# Patient Record
Sex: Male | Born: 1960
Health system: Southern US, Community
[De-identification: ages and names within clinical notes are randomized; demographics above are authoritative.]

## PROBLEM LIST (undated history)

## (undated) DIAGNOSIS — F329 Major depressive disorder, single episode, unspecified: Secondary | ICD-10-CM

## (undated) DIAGNOSIS — G8929 Other chronic pain: Secondary | ICD-10-CM

## (undated) DIAGNOSIS — E785 Hyperlipidemia, unspecified: Secondary | ICD-10-CM

## (undated) DIAGNOSIS — F32A Depression, unspecified: Secondary | ICD-10-CM

## (undated) DIAGNOSIS — F419 Anxiety disorder, unspecified: Secondary | ICD-10-CM

## (undated) DIAGNOSIS — R7303 Prediabetes: Secondary | ICD-10-CM

## (undated) DIAGNOSIS — M549 Dorsalgia, unspecified: Secondary | ICD-10-CM

## (undated) HISTORY — DX: Hyperlipidemia, unspecified: E78.5

## (undated) HISTORY — DX: Anxiety disorder, unspecified: F41.9

## (undated) HISTORY — DX: Other chronic pain: G89.29

## (undated) HISTORY — DX: Prediabetes: R73.03

---

## 2002-10-01 HISTORY — PX: NASAL SINUS SURGERY: SHX719

## 2006-05-02 ENCOUNTER — Emergency Department: Payer: Self-pay | Admitting: General Practice

## 2008-05-30 ENCOUNTER — Emergency Department: Payer: Self-pay | Admitting: Emergency Medicine

## 2008-05-30 ENCOUNTER — Other Ambulatory Visit: Payer: Self-pay

## 2012-10-01 HISTORY — PX: CATARACT EXTRACTION: SUR2

## 2012-11-04 ENCOUNTER — Ambulatory Visit: Payer: Self-pay | Admitting: Ophthalmology

## 2012-12-30 ENCOUNTER — Ambulatory Visit: Payer: Self-pay | Admitting: Ophthalmology

## 2013-10-23 ENCOUNTER — Ambulatory Visit: Payer: Self-pay | Admitting: Family Medicine

## 2013-12-03 ENCOUNTER — Ambulatory Visit: Payer: Self-pay | Admitting: Family Medicine

## 2014-01-08 ENCOUNTER — Ambulatory Visit: Payer: Self-pay | Admitting: Family Medicine

## 2014-01-27 ENCOUNTER — Ambulatory Visit: Payer: Self-pay | Admitting: Family Medicine

## 2014-03-08 ENCOUNTER — Ambulatory Visit: Payer: Self-pay | Admitting: Family Medicine

## 2014-06-22 ENCOUNTER — Ambulatory Visit: Payer: Self-pay | Admitting: Neurosurgery

## 2014-12-02 LAB — LIPID PANEL
CHOLESTEROL: 224 mg/dL — AB (ref 0–200)
HDL: 35 mg/dL (ref 35–70)
Triglycerides: 440 mg/dL — AB (ref 40–160)

## 2015-01-03 LAB — TSH: TSH: 0.94 u[IU]/mL (ref <=5.90)

## 2015-01-03 LAB — HEMOGLOBIN A1C: Hgb A1c MFr Bld: 5.9 % (ref 4.0–6.0)

## 2015-01-21 NOTE — Op Note (Signed)
PATIENT NAME:  John Garrett, John Garrett MR#:  [id number] DATE OF BIRTH:  March 14, 1961  DATE OF PROCEDURE:  11/04/2012  LOCATION:  [place].  PREOPERATIVE DIAGNOSIS: Visually significant cataract of the right eye.   POSTOPERATIVE DIAGNOSIS: Visually significant cataract of the right eye.   OPERATIVE PROCEDURE: Cataract extraction by phacoemulsification with implant of intraocular lens to the right eye.   SURGEON: Birder Robson, MD  ANESTHESIA:  1. Managed anesthesia care.  2. 50-50 mixture of 0.75% bupivacaine and 4% Xylocaine given as a retrobulbar block.   COMPLICATIONS: None.   TECHNIQUE:  Four quadrant divide and conquer.   DESCRIPTION OF PROCEDURE: The patient was examined and consented for this procedure in the preoperative holding area and then brought back to the Operating Room where the anesthesia team employed managed anesthesia care.  3.5 milliliters of the aforementioned mixture were placed in the right orbit on an Atkinson needle without complication. The right eye was then prepped and draped in the usual sterile ophthalmic fashion. A lid speculum was placed. The side-port blade was used to create a paracentesis and the anterior chamber was filled with viscoelastic. The keratome was used to create a near clear corneal incision. The continuous curvilinear capsulorrhexis was performed with a cystotome followed by the capsulorrhexis forceps. Hydrodissection and hydrodelineation were carried out with BSS on a blunt cannula. The lens was removed in a four quadrant divide and conquer technique. The remaining cortical material was removed with the irrigation-aspiration handpiece. The capsular bag was inflated with viscoelastic and the Tecnis ZCB00 22.5-diopter lens, serial number [id number] was placed in the capsular bag without complication. The remaining viscoelastic was removed from the eye with the irrigation-aspiration handpiece. The wounds were hydrated. The anterior chamber was  flushed with Miostat and the eye was inflated to a physiologic pressure. 0.1 mL of cefuroxime concentration 10 mg/mL was placed in the anterior chamber.  The wounds were found to be water tight. The eye was dressed with Vigamox followed by Maxitrol ointment and a protective shield was placed. The patient will followup with me in 1 day.    ____________________________ Livingston Diones. Martika Egler, MD wlp:aw D: 11/04/2012 20:00:56 ET T: 11/05/2012 06:30:24 ET JOB#: [id number]  cc: Remiel Corti L. Bryona Foxworthy, MD, <Dictator> Livingston Diones Jet Traynham MD ELECTRONICALLY SIGNED 11/06/2012 15:35

## 2015-01-21 NOTE — Op Note (Signed)
PATIENT NAME:  John Garrett, John Garrett MR#:  [id number] DATE OF BIRTH:  1961-09-26  DATE OF PROCEDURE:  12/30/2012  PREOPERATIVE DIAGNOSIS: Visually significant cataract of the left eye.   POSTOPERATIVE DIAGNOSIS: Visually significant cataract of the left eye.   OPERATIVE PROCEDURE: Cataract extraction by phacoemulsification with implant of intraocular lens to left eye.   SURGEON: Birder Robson, MD   ANESTHESIA:  1. Managed anesthesia care.  2. Topical tetracaine drops followed by 2% Xylocaine jelly applied in the preoperative holding area.   COMPLICATIONS: None.   TECHNIQUE:  Four-quadrant divide and conquer.  DESCRIPTION OF PROCEDURE: The patient was examined and consented in the preoperative holding area where the aforementioned topical anesthesia was applied to the left eye and then brought back to the Operating Room where the left eye was prepped and draped in the usual sterile ophthalmic fashion and a lid speculum was placed. A paracentesis was created with the side port blade and the anterior chamber was filled with viscoelastic. A near clear corneal incision was performed with the steel keratome. A continuous curvilinear capsulorrhexis was performed with a cystotome followed by the capsulorrhexis forceps. Hydrodissection and hydrodelineation were carried out with BSS on a blunt cannula. The lens was removed in a four-quadrant divide and conquer technique and the remaining cortical material was removed with the irrigation-aspiration handpiece. The capsular bag was inflated with viscoelastic and the Technis ZCBOO   22.0-diopter lens, serial number [id number], was placed in the capsular bag without complication. The remaining viscoelastic was removed from the eye with the irrigation-aspiration handpiece. The wounds were hydrated. The anterior chamber was flushed with Miostat and the eye was inflated to physiologic pressure. 0.1 mL of cefuroxime concentration 10 mg/mL was placed in the anterior  chamber. The wounds were found to be water tight. The eye was dressed with Vigamox. The patient was given protective glasses to wear throughout the day and a shield with which to sleep tonight. The patient was also given drops with which to begin a drop regimen today and will follow-up with me in one day.    ____________________________ Livingston Diones. Anushri Casalino, MD wlp:cb D: 12/30/2012 22:02:38 ET T: 12/30/2012 23:55:33 ET JOB#: [id number]  cc: Ankur Snowdon L. Ady Heimann, MD, <Dictator> Livingston Diones Chirstina Haan MD ELECTRONICALLY SIGNED 12/31/2012 10:30

## 2015-02-05 ENCOUNTER — Emergency Department
Admission: EM | Admit: 2015-02-05 | Discharge: 2015-02-05 | Disposition: A | Discharge status: Home / Self Care | Payer: No Typology Code available for payment source | Attending: Emergency Medicine | Admitting: Emergency Medicine

## 2015-02-05 ENCOUNTER — Encounter: Payer: Self-pay | Admitting: Emergency Medicine

## 2015-02-05 ENCOUNTER — Emergency Department: Payer: No Typology Code available for payment source

## 2015-02-05 DIAGNOSIS — Y998 Other external cause status: Secondary | ICD-10-CM | POA: Diagnosis not present

## 2015-02-05 DIAGNOSIS — S39012A Strain of muscle, fascia and tendon of lower back, initial encounter: Secondary | ICD-10-CM

## 2015-02-05 DIAGNOSIS — Y9389 Activity, other specified: Secondary | ICD-10-CM | POA: Insufficient documentation

## 2015-02-05 DIAGNOSIS — Z87891 Personal history of nicotine dependence: Secondary | ICD-10-CM | POA: Diagnosis not present

## 2015-02-05 DIAGNOSIS — S3992XA Unspecified injury of lower back, initial encounter: Secondary | ICD-10-CM | POA: Diagnosis present

## 2015-02-05 DIAGNOSIS — Y92410 Unspecified street and highway as the place of occurrence of the external cause: Secondary | ICD-10-CM | POA: Diagnosis not present

## 2015-02-05 DIAGNOSIS — S199XXA Unspecified injury of neck, initial encounter: Secondary | ICD-10-CM | POA: Diagnosis not present

## 2015-02-05 DIAGNOSIS — M62830 Muscle spasm of back: Secondary | ICD-10-CM

## 2015-02-05 HISTORY — DX: Other chronic pain: G89.29

## 2015-02-05 HISTORY — DX: Major depressive disorder, single episode, unspecified: F32.9

## 2015-02-05 HISTORY — DX: Depression, unspecified: F32.A

## 2015-02-05 HISTORY — DX: Dorsalgia, unspecified: M54.9

## 2015-02-05 MED ORDER — KETOROLAC TROMETHAMINE 60 MG/2ML IM SOLN
60.0000 mg | Freq: Once | INTRAMUSCULAR | Status: AC
  Administered 2015-02-05: 60 mg via INTRAMUSCULAR

## 2015-02-05 MED ORDER — DIAZEPAM 5 MG/ML IJ SOLN
10.0000 mg | Freq: Once | INTRAMUSCULAR | Status: AC
  Administered 2015-02-05: 10 mg via INTRAMUSCULAR

## 2015-02-05 MED ORDER — DIAZEPAM 5 MG/ML IJ SOLN
INTRAMUSCULAR | Status: AC
  Administered 2015-02-05: 10 mg via INTRAMUSCULAR
  Filled 2015-02-05: qty 2

## 2015-02-05 MED ORDER — KETOROLAC TROMETHAMINE 60 MG/2ML IM SOLN
INTRAMUSCULAR | Status: AC
  Administered 2015-02-05: 60 mg via INTRAMUSCULAR
  Filled 2015-02-05: qty 2

## 2015-02-05 MED ORDER — ORPHENADRINE CITRATE ER 100 MG PO TB12: 100.0000 mg | ORAL_TABLET | Freq: Two times a day (BID) | ORAL | Status: DC | PRN
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
## 2015-03-08 ENCOUNTER — Ambulatory Visit (INDEPENDENT_AMBULATORY_CARE_PROVIDER_SITE_OTHER): Payer: Medicare PPO | Admitting: Family Medicine

## 2015-03-08 ENCOUNTER — Encounter: Payer: Self-pay | Admitting: Family Medicine

## 2015-03-08 VITALS — BP 120/80 | HR 88 | Resp 16 | Ht 63.0 in | Wt 248.6 lb

## 2015-03-08 DIAGNOSIS — F419 Anxiety disorder, unspecified: Secondary | ICD-10-CM | POA: Diagnosis not present

## 2015-03-08 DIAGNOSIS — F329 Major depressive disorder, single episode, unspecified: Secondary | ICD-10-CM | POA: Insufficient documentation

## 2015-03-08 DIAGNOSIS — F411 Generalized anxiety disorder: Secondary | ICD-10-CM | POA: Insufficient documentation

## 2015-03-08 DIAGNOSIS — M5416 Radiculopathy, lumbar region: Secondary | ICD-10-CM

## 2015-03-08 DIAGNOSIS — M542 Cervicalgia: Secondary | ICD-10-CM | POA: Diagnosis not present

## 2015-03-08 DIAGNOSIS — F1120 Opioid dependence, uncomplicated: Secondary | ICD-10-CM | POA: Insufficient documentation

## 2015-03-08 MED ORDER — OXYCODONE-ACETAMINOPHEN 5-325 MG PO TABS: 1.0000 | ORAL_TABLET | ORAL | Status: DC | PRN

## 2015-03-08 MED ORDER — ALPRAZOLAM 0.25 MG PO TABS
0.2500 mg | ORAL_TABLET | Freq: Every day | ORAL | Status: DC | PRN
Start: 2015-03-08 — End: 2015-12-02
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
## 2015-04-07 ENCOUNTER — Ambulatory Visit (INDEPENDENT_AMBULATORY_CARE_PROVIDER_SITE_OTHER): Payer: Medicare PPO | Admitting: Family Medicine

## 2015-04-07 VITALS — BP 118/78 | HR 78 | Ht 68.0 in | Wt 245.0 lb

## 2015-04-07 DIAGNOSIS — M5416 Radiculopathy, lumbar region: Secondary | ICD-10-CM

## 2015-04-07 MED ORDER — OXYCODONE-ACETAMINOPHEN 5-325 MG PO TABS: 1.0000 | ORAL_TABLET | ORAL | Status: DC | PRN
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
## 2015-05-03 ENCOUNTER — Ambulatory Visit (INDEPENDENT_AMBULATORY_CARE_PROVIDER_SITE_OTHER): Payer: Medicare PPO | Admitting: Family Medicine

## 2015-05-03 ENCOUNTER — Encounter: Payer: Self-pay | Admitting: Family Medicine

## 2015-05-03 ENCOUNTER — Encounter: Payer: Self-pay | Admitting: Internal Medicine

## 2015-05-03 VITALS — BP 144/75 | HR 84 | Temp 98.5°F | Resp 18 | Ht 68.0 in | Wt 238.6 lb

## 2015-05-03 DIAGNOSIS — F329 Major depressive disorder, single episode, unspecified: Secondary | ICD-10-CM

## 2015-05-03 DIAGNOSIS — E785 Hyperlipidemia, unspecified: Secondary | ICD-10-CM | POA: Diagnosis not present

## 2015-05-03 DIAGNOSIS — F322 Major depressive disorder, single episode, severe without psychotic features: Secondary | ICD-10-CM | POA: Diagnosis not present

## 2015-05-03 DIAGNOSIS — M5416 Radiculopathy, lumbar region: Secondary | ICD-10-CM

## 2015-05-03 DIAGNOSIS — E782 Mixed hyperlipidemia: Secondary | ICD-10-CM | POA: Insufficient documentation

## 2015-05-03 MED ORDER — BACLOFEN 10 MG PO TABS: 10.0000 mg | ORAL_TABLET | Freq: Four times a day (QID) | ORAL | Status: DC | PRN

## 2015-05-03 MED ORDER — BUPROPION HCL ER (XL) 300 MG PO TB24: 300.0000 mg | ORAL_TABLET | Freq: Every day | ORAL | Status: DC

## 2015-05-03 MED ORDER — IBUPROFEN 800 MG PO TABS: 800.0000 mg | ORAL_TABLET | Freq: Every day | ORAL | Status: DC | PRN

## 2015-05-03 MED ORDER — CITALOPRAM HYDROBROMIDE 40 MG PO TABS: 40.0000 mg | ORAL_TABLET | Freq: Every day | ORAL | Status: DC

## 2015-05-03 MED ORDER — OXYCODONE-ACETAMINOPHEN 5-325 MG PO TABS: 1.0000 | ORAL_TABLET | ORAL | Status: DC | PRN

## 2015-05-03 MED ORDER — ROSUVASTATIN CALCIUM 10 MG PO TABS: 10.0000 mg | ORAL_TABLET | Freq: Every day | ORAL | Status: DC

## 2015-05-03 NOTE — Progress Notes (Signed)
Name: John Garrett   MRN: [id number]    DOB: [birth year]   Date:05/03/2015       Progress Note  Subjective  Chief Complaint  Chief Complaint  Patient presents with  . Follow-up    4 wk  . Anxiety  . Hyperlipidemia  . Medication Refill    Hyperlipidemia This is a chronic problem. Recent lipid tests were reviewed and are high (elevated TC and elevated TG). Exacerbating diseases include obesity. Associated symptoms include leg pain. Pertinent negatives include no chest pain, myalgias or shortness of breath. Current antihyperlipidemic treatment includes statins. There are no compliance problems.  Risk factors for coronary artery disease include obesity, male sex and dyslipidemia.  Back Pain This is a chronic problem. The pain is present in the lumbar spine. The quality of the pain is described as burning and aching. The pain radiates to the right thigh. The pain is at a severity of 7/10. The pain is moderate. The symptoms are aggravated by sitting (sitting down for long periods of time ). Associated symptoms include leg pain and numbness. Pertinent negatives include no bladder incontinence, bowel incontinence, chest pain or paresis. He has tried NSAIDs, muscle relaxant and analgesics for the symptoms. The treatment provided significant relief.  Depression Pt. Is on SSRI and a non-SSRI anti-depressant for major depression, currently in remission. He reports no side effects from Bupropion and Celexa 40 mg daily.     Past Medical History  Diagnosis Date  . Depression   . Chronic back pain   . Anxiety   . Hyperlipidemia   . Chronic pain     Past Surgical History  Procedure Laterality Date  . Cataract extraction Right 2014    Family History  Problem Relation Age of Onset  . Adopted: Yes  . Family history unknown: Yes    History   Social History  . Marital Status: Single    Spouse Name: N/A  . Number of Children: N/A  . Years of Education: N/A   Occupational History  . Not  on file.   Social History Main Topics  . Smoking status: Current Every Day Smoker -- 1.00 packs/day    Types: Cigarettes, E-cigarettes  . Smokeless tobacco: Never Used  . Alcohol Use: 1.2 oz/week    2 Cans of beer per week  . Drug Use: No  . Sexual Activity: Not on file   Other Topics Concern  . Not on file   Social History Narrative     Current outpatient prescriptions:  .  ALPRAZolam (XANAX) 0.25 MG tablet, Take 1 tablet (0.25 mg total) by mouth daily as needed., Disp: 30 tablet, Rfl: 0 .  baclofen (LIORESAL) 10 MG tablet, Take 1 tablet by mouth 4 (four) times daily as needed., Disp: , Rfl:  .  buPROPion (WELLBUTRIN XL) 300 MG 24 hr tablet, Take 1 tablet by mouth daily., Disp: , Rfl:  .  busPIRone (BUSPAR) 15 MG tablet, Take 1 tablet by mouth 2 (two) times daily., Disp: , Rfl:  .  citalopram (CELEXA) 40 MG tablet, Take 1 tablet by mouth daily., Disp: , Rfl:  .  ibuprofen (ADVIL,MOTRIN) 800 MG tablet, Take 1 tablet by mouth daily as needed., Disp: , Rfl:  .  orphenadrine (NORFLEX) 100 MG tablet, take 1 tablet by mouth twice a day if needed for muscle spasm, Disp: , Rfl: 0 .  oxyCODONE-acetaminophen (PERCOCET/ROXICET) 5-325 MG per tablet, Take 1 tablet by mouth every 4 (four) hours as needed for severe pain. PRN, Disp:  180 tablet, Rfl: 0 .  rosuvastatin (CRESTOR) 10 MG tablet, Take 1 tablet by mouth daily., Disp: , Rfl:   No Known Allergies   Review of Systems  Respiratory: Negative for shortness of breath.   Cardiovascular: Negative for chest pain.  Gastrointestinal: Negative for bowel incontinence.  Genitourinary: Negative for bladder incontinence.  Musculoskeletal: Positive for back pain. Negative for myalgias.  Neurological: Positive for numbness.  Psychiatric/Behavioral: Positive for depression. The patient is nervous/anxious and has insomnia.       Objective  Filed Vitals:   05/03/15 1030  BP: 144/75  Pulse: 84  Temp: 98.5 F (36.9 C)  TempSrc: Oral  Resp:  18  Height: 5\' 8"  (1.727 m)  Weight: 238 lb 9.6 oz (108.228 kg)  SpO2: 96%    Physical Exam  Constitutional: He is oriented to person, place, and time and well-developed, well-nourished, and in no distress.  HENT:  Head: Normocephalic and atraumatic.  Cardiovascular: Normal rate.   Pulmonary/Chest: Breath sounds normal.  Abdominal: Soft. Bowel sounds are normal.  Musculoskeletal:       Lumbar back: He exhibits tenderness, pain and spasm. He exhibits no swelling.       Back:  Neurological: He is alert and oriented to person, place, and time.  Skin: Skin is warm and dry.  Psychiatric: Affect and judgment normal.  Nursing note and vitals reviewed.    Assessment & Plan 1. Hyperlipidemia Elevated triglycerides and total cholesterol on his last FLP in March 2016. Patient continues on Crestor 10 mg at bedtime. Recheck FLP today - rosuvastatin (CRESTOR) 10 MG tablet; Take 1 tablet (10 mg total) by mouth daily.  Dispense: 90 tablet; Refill: 0 - Lipid Profile - Comprehensive metabolic panel  2. Major depression, chronic Symptoms stable on present therapy. Refills provided. - buPROPion (WELLBUTRIN XL) 300 MG 24 hr tablet; Take 1 tablet (300 mg total) by mouth daily.  Dispense: 90 tablet; Refill: 0 - citalopram (CELEXA) 40 MG tablet; Take 1 tablet (40 mg total) by mouth daily.  Dispense: 90 tablet; Refill: 0  3. Lumbar radiculopathy, chronic Chronic low back pain, responsive to opioid, NSAID, and muscle relaxant therapy. Patient is taking the medications as directed. He is aware of the potential adverse effects of opioids. Refills provided and follow-up in one month. - oxyCODONE-acetaminophen (PERCOCET/ROXICET) 5-325 MG per tablet; Take 1 tablet by mouth every 4 (four) hours as needed for severe pain. PRN  Dispense: 180 tablet; Refill: 0 - baclofen (LIORESAL) 10 MG tablet; Take 1 tablet (10 mg total) by mouth 4 (four) times daily as needed.  Dispense: 120 tablet; Refill: 5 - ibuprofen  (ADVIL,MOTRIN) 800 MG tablet; Take 1 tablet (800 mg total) by mouth daily as needed.  Dispense: 90 tablet; Refill: 1    Mckinze Poirier Asad A. [facility] 05/03/2015 11:29 AM

## 2015-05-04 ENCOUNTER — Ambulatory Visit: Payer: Medicare PPO | Admitting: Family Medicine

## 2015-05-05 ENCOUNTER — Telehealth: Payer: Self-pay | Admitting: Internal Medicine

## 2015-05-05 ENCOUNTER — Ambulatory Visit: Payer: Medicare PPO | Admitting: Family Medicine

## 2015-05-05 NOTE — Telephone Encounter (Signed)
Called patient to inform him that John Garrett and [place] papers are ready for pickup.

## 2015-05-05 NOTE — Telephone Encounter (Signed)
Called patient to inform him that Bertell Maria and [place] papers are ready for pickup.

## 2015-05-31 ENCOUNTER — Ambulatory Visit: Payer: Medicare PPO | Admitting: Family Medicine

## 2015-06-02 ENCOUNTER — Encounter: Payer: Self-pay | Admitting: Family Medicine

## 2015-06-02 ENCOUNTER — Ambulatory Visit (INDEPENDENT_AMBULATORY_CARE_PROVIDER_SITE_OTHER): Payer: Medicare PPO | Admitting: Family Medicine

## 2015-06-02 VITALS — BP 140/75 | HR 74 | Temp 97.9°F | Resp 17 | Ht 68.0 in | Wt 248.1 lb

## 2015-06-02 DIAGNOSIS — M5416 Radiculopathy, lumbar region: Secondary | ICD-10-CM

## 2015-06-02 MED ORDER — OXYCODONE-ACETAMINOPHEN 5-325 MG PO TABS: 1.0000 | ORAL_TABLET | ORAL | Status: DC | PRN

## 2015-06-02 NOTE — Progress Notes (Signed)
Name: John Garrett   MRN: [id number]    DOB: [birth year]   Date:06/02/2015       Progress Note  Subjective  Chief Complaint  Chief Complaint  Patient presents with  . Medication Refill  . Hyperlipidemia  . Anxiety    Back Pain This is a chronic problem. The pain is present in the lumbar spine. The pain is at a severity of 6/10. The pain is moderate. The symptoms are aggravated by bending, sitting and standing. Associated symptoms include leg pain. Pertinent negatives include no bladder incontinence, bowel incontinence or numbness. John Garrett has tried muscle relaxant, analgesics, NSAIDs and chiropractic manipulation for the symptoms.     Past Medical History  Diagnosis Date  . Depression   . Chronic back pain   . Anxiety   . Hyperlipidemia   . Chronic pain     Past Surgical History  Procedure Laterality Date  . Cataract extraction Right 2014    Family History  Problem Relation Age of Onset  . Adopted: Yes  . Family history unknown: Yes    Social History   Social History  . Marital Status: Single    Spouse Name: N/A  . Number of Children: N/A  . Years of Education: N/A   Occupational History  . Not on file.   Social History Main Topics  . Smoking status: Current Every Day Smoker -- 1.00 packs/day    Types: Cigarettes, E-cigarettes  . Smokeless tobacco: Never Used  . Alcohol Use: 1.2 oz/week    2 Cans of beer per week  . Drug Use: No  . Sexual Activity: Not on file   Other Topics Concern  . Not on file   Social History Narrative     Current outpatient prescriptions:  .  ALPRAZolam (XANAX) 0.25 MG tablet, Take 1 tablet (0.25 mg total) by mouth daily as needed., Disp: 30 tablet, Rfl: 0 .  baclofen (LIORESAL) 10 MG tablet, Take 1 tablet (10 mg total) by mouth 4 (four) times daily as needed., Disp: 120 tablet, Rfl: 5 .  buPROPion (WELLBUTRIN XL) 300 MG 24 hr tablet, Take 1 tablet (300 mg total) by mouth daily., Disp: 90 tablet, Rfl: 0 .  busPIRone (BUSPAR) 15 MG  tablet, Take 1 tablet by mouth 2 (two) times daily., Disp: , Rfl:  .  citalopram (CELEXA) 40 MG tablet, Take 1 tablet (40 mg total) by mouth daily., Disp: 90 tablet, Rfl: 0 .  ibuprofen (ADVIL,MOTRIN) 800 MG tablet, Take 1 tablet (800 mg total) by mouth daily as needed., Disp: 90 tablet, Rfl: 1 .  orphenadrine (NORFLEX) 100 MG tablet, take 1 tablet by mouth twice a day if needed for muscle spasm, Disp: , Rfl: 0 .  oxyCODONE-acetaminophen (PERCOCET/ROXICET) 5-325 MG per tablet, Take 1 tablet by mouth every 4 (four) hours as needed for severe pain. PRN, Disp: 180 tablet, Rfl: 0 .  rosuvastatin (CRESTOR) 10 MG tablet, Take 1 tablet (10 mg total) by mouth daily., Disp: 90 tablet, Rfl: 0  No Known Allergies   Review of Systems  Gastrointestinal: Negative for bowel incontinence.  Genitourinary: Negative for bladder incontinence.  Musculoskeletal: Positive for back pain.  Neurological: Negative for numbness.    Objective  Filed Vitals:   06/02/15 1039  BP: 140/75  Pulse: 74  Temp: 97.9 F (36.6 C)  TempSrc: Oral  Resp: 17  Height: 5\' 8"  (1.727 m)  Weight: 248 lb 1.6 oz (112.537 kg)  SpO2: 96%    Physical Exam  Constitutional: John Garrett is  well-developed, well-nourished, and in no distress.  Cardiovascular: Normal rate and regular rhythm.   Pulmonary/Chest: Effort normal and breath sounds normal.  Musculoskeletal:       Lumbar back: John Garrett exhibits tenderness, pain and spasm.       Back:  Nursing note and vitals reviewed.  Assessment & Plan  1. Lumbar radiculopathy, chronic Chronic low back pain, stable and controlled on opioid therapy. Refills provided. Follow-up in one month.  - oxyCODONE-acetaminophen (PERCOCET/ROXICET) 5-325 MG per tablet; Take 1 tablet by mouth every 4 (four) hours as needed for severe pain. PRN  Dispense: 180 tablet; Refill: 0   Merwin Breden Asad A. [facility] 06/02/2015 11:03 AM

## 2015-07-04 ENCOUNTER — Ambulatory Visit (INDEPENDENT_AMBULATORY_CARE_PROVIDER_SITE_OTHER): Payer: Medicare PPO | Admitting: Family Medicine

## 2015-07-04 ENCOUNTER — Encounter: Payer: Self-pay | Admitting: Family Medicine

## 2015-07-04 VITALS — BP 140/75 | HR 94 | Temp 97.8°F | Resp 18 | Ht 68.0 in | Wt 240.6 lb

## 2015-07-04 DIAGNOSIS — F17200 Nicotine dependence, unspecified, uncomplicated: Secondary | ICD-10-CM | POA: Insufficient documentation

## 2015-07-04 DIAGNOSIS — M5416 Radiculopathy, lumbar region: Secondary | ICD-10-CM | POA: Diagnosis not present

## 2015-07-04 MED ORDER — OXYCODONE-ACETAMINOPHEN 5-325 MG PO TABS: 1.0000 | ORAL_TABLET | ORAL | Status: DC | PRN

## 2015-07-04 NOTE — Progress Notes (Signed)
Name: John Garrett   MRN: [id number]    DOB: [birth year]   Date:07/04/2015       Progress Note  Subjective  Chief Complaint  Chief Complaint  Patient presents with  . Follow-up    1 mo  . Hyperlipidemia  . Anxiety  . Medication Refill    Oxycodone    Back Pain This is a chronic problem. The problem is unchanged. The pain is present in the lumbar spine. The quality of the pain is described as shooting and cramping. The pain is at a severity of 5/10. The pain is moderate. The symptoms are aggravated by bending, sitting and standing. Associated symptoms include leg pain. Pertinent negatives include no bladder incontinence, bowel incontinence or numbness. He has tried muscle relaxant, analgesics and NSAIDs for the symptoms.    Past Medical History  Diagnosis Date  . Depression   . Chronic back pain   . Anxiety   . Hyperlipidemia   . Chronic pain     Past Surgical History  Procedure Laterality Date  . Cataract extraction Right 2014    Family History  Problem Relation Age of Onset  . Adopted: Yes  . Family history unknown: Yes    Social History   Social History  . Marital Status: Single    Spouse Name: N/A  . Number of Children: N/A  . Years of Education: N/A   Occupational History  . Not on file.   Social History Main Topics  . Smoking status: Current Every Day Smoker -- 1.00 packs/day    Types: Cigarettes, E-cigarettes  . Smokeless tobacco: Never Used  . Alcohol Use: 1.2 oz/week    2 Cans of beer per week  . Drug Use: No  . Sexual Activity: Not on file   Other Topics Concern  . Not on file   Social History Narrative    Current outpatient prescriptions:  .  ALPRAZolam (XANAX) 0.25 MG tablet, Take 1 tablet (0.25 mg total) by mouth daily as needed., Disp: 30 tablet, Rfl: 0 .  baclofen (LIORESAL) 10 MG tablet, Take 1 tablet (10 mg total) by mouth 4 (four) times daily as needed., Disp: 120 tablet, Rfl: 5 .  buPROPion (WELLBUTRIN XL) 300 MG 24 hr tablet, Take  1 tablet (300 mg total) by mouth daily., Disp: 90 tablet, Rfl: 0 .  busPIRone (BUSPAR) 15 MG tablet, Take 1 tablet by mouth 2 (two) times daily., Disp: , Rfl:  .  citalopram (CELEXA) 40 MG tablet, Take 1 tablet (40 mg total) by mouth daily., Disp: 90 tablet, Rfl: 0 .  ibuprofen (ADVIL,MOTRIN) 800 MG tablet, Take 1 tablet (800 mg total) by mouth daily as needed., Disp: 90 tablet, Rfl: 1 .  orphenadrine (NORFLEX) 100 MG tablet, take 1 tablet by mouth twice a day if needed for muscle spasm, Disp: , Rfl: 0 .  oxyCODONE-acetaminophen (PERCOCET/ROXICET) 5-325 MG per tablet, Take 1 tablet by mouth every 4 (four) hours as needed for severe pain. PRN, Disp: 180 tablet, Rfl: 0 .  rosuvastatin (CRESTOR) 10 MG tablet, Take 1 tablet (10 mg total) by mouth daily., Disp: 90 tablet, Rfl: 0  No Known Allergies   Review of Systems  Gastrointestinal: Negative for bowel incontinence.  Genitourinary: Negative for bladder incontinence.  Musculoskeletal: Positive for back pain.  Neurological: Negative for numbness.    Objective  Filed Vitals:   07/04/15 1113  BP: 140/75  Pulse: 94  Temp: 97.8 F (36.6 C)  TempSrc: Oral  Resp: 18  Height: 5'  8" (1.727 m)  Weight: 240 lb 9.6 oz (109.135 kg)  SpO2: 97%    Physical Exam  Constitutional: He is well-developed, well-nourished, and in no distress.  Cardiovascular: Normal rate and regular rhythm.   Pulmonary/Chest: Effort normal and breath sounds normal.  Musculoskeletal:       Lumbar back: He exhibits tenderness, pain and spasm.       Back:  Nursing note and vitals reviewed.    Assessment & Plan  1. Lumbar radiculopathy, chronic Chronic low back pain, controlled on opioid therapy. Patient compliant with controlled substances agreement. Refills provided and follow-up in one month. - oxyCODONE-acetaminophen (PERCOCET/ROXICET) 5-325 MG tablet; Take 1 tablet by mouth every 4 (four) hours as needed for severe pain. PRN  Dispense: 180 tablet; Refill:  0   Koralee Wedeking Asad A. [facility] 07/04/2015 11:39 AM

## 2015-08-04 ENCOUNTER — Ambulatory Visit (INDEPENDENT_AMBULATORY_CARE_PROVIDER_SITE_OTHER): Payer: Medicare PPO | Admitting: Family Medicine

## 2015-08-04 ENCOUNTER — Encounter: Payer: Self-pay | Admitting: Family Medicine

## 2015-08-04 VITALS — BP 162/76 | HR 98 | Temp 98.8°F | Resp 18 | Ht 68.0 in | Wt 241.6 lb

## 2015-08-04 DIAGNOSIS — E785 Hyperlipidemia, unspecified: Secondary | ICD-10-CM | POA: Insufficient documentation

## 2015-08-04 DIAGNOSIS — M5416 Radiculopathy, lumbar region: Secondary | ICD-10-CM

## 2015-08-04 MED ORDER — OXYCODONE-ACETAMINOPHEN 5-325 MG PO TABS: 1.0000 | ORAL_TABLET | ORAL | Status: DC | PRN

## 2015-08-04 NOTE — Progress Notes (Signed)
Name: John Garrett   MRN: [id number]    DOB: 01/25/1961   Date:08/04/2015       Progress Note  Subjective  Chief Complaint  Chief Complaint  Patient presents with  . Back Pain    1 month follow up    Back Pain This is a chronic problem. The problem is unchanged. The pain is present in the gluteal and lumbar spine. The quality of the pain is described as shooting. The pain is at a severity of 7/10. The pain is severe. The symptoms are aggravated by position. Associated symptoms include leg pain. Pertinent negatives include no bladder incontinence, bowel incontinence or numbness. He has tried analgesics for the symptoms.    Past Medical History  Diagnosis Date  . Depression   . Chronic back pain   . Anxiety   . Hyperlipidemia   . Chronic pain     Past Surgical History  Procedure Laterality Date  . Cataract extraction Right 2014    Family History  Problem Relation Age of Onset  . Adopted: Yes  . Family history unknown: Yes    Social History   Social History  . Marital Status: Single    Spouse Name: N/A  . Number of Children: N/A  . Years of Education: N/A   Occupational History  . Not on file.   Social History Main Topics  . Smoking status: Current Every Day Smoker -- 1.00 packs/day    Types: Cigarettes, E-cigarettes  . Smokeless tobacco: Never Used  . Alcohol Use: 1.2 oz/week    2 Cans of beer per week  . Drug Use: No  . Sexual Activity: Not on file   Other Topics Concern  . Not on file   Social History Narrative     Current outpatient prescriptions:  .  ALPRAZolam (XANAX) 0.25 MG tablet, Take 1 tablet (0.25 mg total) by mouth daily as needed., Disp: 30 tablet, Rfl: 0 .  baclofen (LIORESAL) 10 MG tablet, Take 1 tablet (10 mg total) by mouth 4 (four) times daily as needed., Disp: 120 tablet, Rfl: 5 .  buPROPion (WELLBUTRIN XL) 300 MG 24 hr tablet, Take 1 tablet (300 mg total) by mouth daily., Disp: 90 tablet, Rfl: 0 .  busPIRone (BUSPAR) 15 MG tablet,  Take 1 tablet by mouth 2 (two) times daily., Disp: , Rfl:  .  citalopram (CELEXA) 40 MG tablet, Take 1 tablet (40 mg total) by mouth daily., Disp: 90 tablet, Rfl: 0 .  ibuprofen (ADVIL,MOTRIN) 800 MG tablet, Take 1 tablet (800 mg total) by mouth daily as needed., Disp: 90 tablet, Rfl: 1 .  orphenadrine (NORFLEX) 100 MG tablet, take 1 tablet by mouth twice a day if needed for muscle spasm, Disp: , Rfl: 0 .  oxyCODONE-acetaminophen (PERCOCET/ROXICET) 5-325 MG tablet, Take 1 tablet by mouth every 4 (four) hours as needed for severe pain. PRN, Disp: 180 tablet, Rfl: 0 .  rosuvastatin (CRESTOR) 10 MG tablet, Take 1 tablet (10 mg total) by mouth daily., Disp: 90 tablet, Rfl: 0  No Known Allergies   Review of Systems  Gastrointestinal: Negative for bowel incontinence.  Genitourinary: Negative for bladder incontinence.  Musculoskeletal: Positive for back pain and joint pain.  Neurological: Negative for numbness.    Objective  Filed Vitals:   08/04/15 1027  BP: 162/76  Pulse: 98  Temp: 98.8 F (37.1 C)  TempSrc: Oral  Resp: 18  Height: 5\' 8"  (1.727 m)  Weight: 241 lb 9.6 oz (109.589 kg)  SpO2: 97%  Physical Exam  Constitutional: He is oriented to person, place, and time and well-developed, well-nourished, and in no distress.  Cardiovascular: Normal rate and regular rhythm.   No murmur heard. Pulmonary/Chest: Effort normal and breath sounds normal. He has no wheezes. He has no rales.  Musculoskeletal:       Lumbar back: He exhibits decreased range of motion, tenderness, pain and spasm.       Back:  Neurological: He is alert and oriented to person, place, and time.  Nursing note and vitals reviewed.  Assessment & Plan  1. Lumbar radiculopathy, chronic Chronic low back pain stable on opioid therapy. Patient compliant with controlled substances agreement and taking the medication as directed. No reported adverse effects. Refills provided and follow-up in one month. -  oxyCODONE-acetaminophen (PERCOCET/ROXICET) 5-325 MG tablet; Take 1 tablet by mouth every 4 (four) hours as needed for severe pain. PRN  Dispense: 180 tablet; Refill: 0   John Garrett Asad A. [facility] 08/04/2015 10:39 AM

## 2015-09-01 ENCOUNTER — Encounter: Payer: Self-pay | Admitting: Emergency Medicine

## 2015-09-01 ENCOUNTER — Emergency Department: Payer: Medicare PPO

## 2015-09-01 ENCOUNTER — Emergency Department
Admission: EM | Admit: 2015-09-01 | Discharge: 2015-09-01 | Disposition: A | Discharge status: Home / Self Care | Payer: Medicare PPO | Attending: Emergency Medicine | Admitting: Emergency Medicine

## 2015-09-01 DIAGNOSIS — Z79899 Other long term (current) drug therapy: Secondary | ICD-10-CM | POA: Insufficient documentation

## 2015-09-01 DIAGNOSIS — S61214A Laceration without foreign body of right ring finger without damage to nail, initial encounter: Secondary | ICD-10-CM | POA: Diagnosis not present

## 2015-09-01 DIAGNOSIS — Z23 Encounter for immunization: Secondary | ICD-10-CM | POA: Diagnosis not present

## 2015-09-01 DIAGNOSIS — Y9389 Activity, other specified: Secondary | ICD-10-CM | POA: Insufficient documentation

## 2015-09-01 DIAGNOSIS — Y990 Civilian activity done for income or pay: Secondary | ICD-10-CM | POA: Insufficient documentation

## 2015-09-01 DIAGNOSIS — Y9289 Other specified places as the place of occurrence of the external cause: Secondary | ICD-10-CM | POA: Insufficient documentation

## 2015-09-01 DIAGNOSIS — F17210 Nicotine dependence, cigarettes, uncomplicated: Secondary | ICD-10-CM | POA: Diagnosis not present

## 2015-09-01 DIAGNOSIS — IMO0002 Reserved for concepts with insufficient information to code with codable children: Secondary | ICD-10-CM

## 2015-09-01 MED ORDER — TETANUS-DIPHTH-ACELL PERTUSSIS 5-2.5-18.5 LF-MCG/0.5 IM SUSP
0.5000 mL | Freq: Once | INTRAMUSCULAR | Status: AC
  Administered 2015-09-01: 0.5 mL via INTRAMUSCULAR
  Filled 2015-09-01: qty 0.5

## 2015-09-01 MED ORDER — LIDOCAINE HCL (PF) 1 % IJ SOLN
INTRAMUSCULAR | Status: AC
  Administered 2015-09-01: 5 mL via INTRADERMAL
  Filled 2015-09-01: qty 10

## 2015-09-01 MED ORDER — OXYCODONE-ACETAMINOPHEN 5-325 MG PO TABS
1.0000 | ORAL_TABLET | Freq: Once | ORAL | Status: AC
  Administered 2015-09-01: 1 via ORAL
  Filled 2015-09-01: qty 1

## 2015-09-01 MED ORDER — LIDOCAINE HCL (PF) 1 % IJ SOLN
5.0000 mL | Freq: Once | INTRAMUSCULAR | Status: AC
  Administered 2015-09-01: 5 mL via INTRADERMAL

## 2015-09-01 MED ORDER — CEPHALEXIN 500 MG PO CAPS: 500.0000 mg | ORAL_CAPSULE | Freq: Three times a day (TID) | ORAL | Status: DC

## 2015-09-01 NOTE — ED Notes (Signed)
Cleaned and Wrapped up 4th digit on right hand.

## 2015-09-01 NOTE — ED Notes (Signed)
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

## 2015-09-01 NOTE — Discharge Instructions (Signed)
Please follow-up your primary care physician in 10 days for suture removal. Please take your antibiotics as prescribed. Return to the emergency department for any increased pain, swelling, redness or fever.
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
## 2015-09-01 NOTE — ED Notes (Signed)
Patient transported to X-ray 

## 2015-09-01 NOTE — ED Notes (Signed)
Pt. Going home with friend 

## 2015-09-02 ENCOUNTER — Ambulatory Visit (INDEPENDENT_AMBULATORY_CARE_PROVIDER_SITE_OTHER): Payer: Medicare PPO | Admitting: Family Medicine

## 2015-09-02 ENCOUNTER — Encounter: Payer: Self-pay | Admitting: Family Medicine

## 2015-09-02 VITALS — BP 130/88 | HR 78 | Temp 98.4°F | Resp 17 | Ht 68.0 in | Wt 243.7 lb

## 2015-09-02 DIAGNOSIS — Z23 Encounter for immunization: Secondary | ICD-10-CM

## 2015-09-02 DIAGNOSIS — R030 Elevated blood-pressure reading, without diagnosis of hypertension: Secondary | ICD-10-CM

## 2015-09-02 DIAGNOSIS — Z79899 Other long term (current) drug therapy: Secondary | ICD-10-CM | POA: Insufficient documentation

## 2015-09-02 DIAGNOSIS — IMO0001 Reserved for inherently not codable concepts without codable children: Secondary | ICD-10-CM

## 2015-09-02 DIAGNOSIS — M5416 Radiculopathy, lumbar region: Secondary | ICD-10-CM

## 2015-09-02 MED ORDER — OXYCODONE-ACETAMINOPHEN 5-325 MG PO TABS: 1.0000 | ORAL_TABLET | ORAL | Status: DC | PRN

## 2015-09-02 NOTE — Progress Notes (Signed)
Name: John Garrett   MRN: [id number]    DOB: Apr 29, 1961   Date:09/02/2015       Progress Note  Subjective  Chief Complaint  Chief Complaint  Patient presents with  . Follow-up    1 mo  . Hyperlipidemia  . Medication Refill    oxycodone 5-325 mg     Back Pain This is a chronic problem. The problem is unchanged. The pain is present in the gluteal and lumbar spine. The quality of the pain is described as shooting. The pain is at a severity of 5/10. The pain is moderate. The symptoms are aggravated by position. Associated symptoms include leg pain and numbness (left leg goes numb periodically). Pertinent negatives include no bladder incontinence, bowel incontinence, chest pain, fever or headaches. He has tried analgesics for the symptoms.    Past Medical History  Diagnosis Date  . Depression   . Chronic back pain   . Anxiety   . Hyperlipidemia   . Chronic pain     Past Surgical History  Procedure Laterality Date  . Cataract extraction Right 2014    Family History  Problem Relation Age of Onset  . Adopted: Yes  . Family history unknown: Yes    Social History   Social History  . Marital Status: Single    Spouse Name: N/A  . Number of Children: N/A  . Years of Education: N/A   Occupational History  . Not on file.   Social History Main Topics  . Smoking status: Current Every Day Smoker -- 1.00 packs/day    Types: Cigarettes, E-cigarettes  . Smokeless tobacco: Never Used  . Alcohol Use: 1.2 oz/week    2 Cans of beer per week  . Drug Use: No  . Sexual Activity: Not on file   Other Topics Concern  . Not on file   Social History Narrative     Current outpatient prescriptions:  .  ALPRAZolam (XANAX) 0.25 MG tablet, Take 1 tablet (0.25 mg total) by mouth daily as needed., Disp: 30 tablet, Rfl: 0 .  baclofen (LIORESAL) 10 MG tablet, Take 1 tablet (10 mg total) by mouth 4 (four) times daily as needed., Disp: 120 tablet, Rfl: 5 .  buPROPion (WELLBUTRIN XL) 300 MG  24 hr tablet, Take 1 tablet (300 mg total) by mouth daily., Disp: 90 tablet, Rfl: 0 .  busPIRone (BUSPAR) 15 MG tablet, Take 1 tablet by mouth 2 (two) times daily., Disp: , Rfl:  .  cephALEXin (KEFLEX) 500 MG capsule, Take 1 capsule (500 mg total) by mouth 3 (three) times daily., Disp: 30 capsule, Rfl: 0 .  citalopram (CELEXA) 40 MG tablet, Take 1 tablet (40 mg total) by mouth daily., Disp: 90 tablet, Rfl: 0 .  ibuprofen (ADVIL,MOTRIN) 800 MG tablet, Take 1 tablet (800 mg total) by mouth daily as needed., Disp: 90 tablet, Rfl: 1 .  orphenadrine (NORFLEX) 100 MG tablet, take 1 tablet by mouth twice a day if needed for muscle spasm, Disp: , Rfl: 0 .  oxyCODONE-acetaminophen (PERCOCET/ROXICET) 5-325 MG tablet, Take 1 tablet by mouth every 4 (four) hours as needed for severe pain. PRN, Disp: 180 tablet, Rfl: 0 .  rosuvastatin (CRESTOR) 10 MG tablet, Take 1 tablet (10 mg total) by mouth daily., Disp: 90 tablet, Rfl: 0  No Known Allergies   Review of Systems  Constitutional: Negative for fever and chills.  Eyes: Negative for blurred vision and double vision.  Respiratory: Negative for cough and sputum production.   Cardiovascular: Negative  for chest pain and palpitations.  Gastrointestinal: Negative for bowel incontinence.  Genitourinary: Negative for bladder incontinence.  Musculoskeletal: Positive for back pain.  Neurological: Positive for numbness (left leg goes numb periodically). Negative for headaches.    Objective  Filed Vitals:   09/02/15 1009  BP: 152/72  Pulse: 78  Temp: 98.4 F (36.9 C)  TempSrc: Oral  Resp: 17  Height: 5\' 8"  (1.727 m)  Weight: 243 lb 11.2 oz (110.542 kg)  SpO2: 97%    Physical Exam  Constitutional: He is well-developed, well-nourished, and in no distress.  Cardiovascular: Normal rate and regular rhythm.   Pulmonary/Chest: Effort normal and breath sounds normal.  Musculoskeletal:       Lumbar back: He exhibits tenderness, pain and spasm.        Back:  Skin: Skin is warm and dry.  Nursing note and vitals reviewed.   Assessment & Plan  1. Need for immunization against influenza  - Flu Vaccine QUAD 36+ mos PF IM (Fluarix & Fluzone Quad PF)  2. Lumbar radiculopathy, chronic Pain controlled on Percocet 5 mg taken up to 6 times daily as needed. Patient compliant with controlled substances agreement. Refills provided - oxyCODONE-acetaminophen (PERCOCET/ROXICET) 5-325 MG tablet; Take 1 tablet by mouth every 4 (four) hours as needed for severe pain. PRN  Dispense: 180 tablet; Refill: 0  3. Elevated BP Systolic blood pressure is improved on repeat.patient is in more pain than usual because of an injury sustained to his hand. Advised to check blood pressure at home regularly and bring logs to review in one month. Verbalized understanding   Alfie Rideaux [facility] 09/02/2015 10:24 AM

## 2015-10-04 ENCOUNTER — Ambulatory Visit (INDEPENDENT_AMBULATORY_CARE_PROVIDER_SITE_OTHER): Payer: Medicare PPO | Admitting: Family Medicine

## 2015-10-04 ENCOUNTER — Encounter: Payer: Self-pay | Admitting: Family Medicine

## 2015-10-04 VITALS — BP 131/78 | HR 83 | Temp 98.6°F | Resp 17 | Ht 68.0 in | Wt 250.0 lb

## 2015-10-04 DIAGNOSIS — M5416 Radiculopathy, lumbar region: Secondary | ICD-10-CM | POA: Diagnosis not present

## 2015-10-04 MED ORDER — OXYCODONE-ACETAMINOPHEN 5-325 MG PO TABS: 1.0000 | ORAL_TABLET | ORAL | Status: DC | PRN

## 2015-10-04 NOTE — Progress Notes (Signed)
Name: John Garrett   MRN: [id number]    DOB: [birth year]   Date:10/04/2015       Progress Note  Subjective  Chief Complaint  Chief Complaint  Patient presents with  . Follow-up    1 mo  . Medication Refill    oxycodone 5-325mg    . Hyperlipidemia    Back Pain This is a chronic problem. The problem is unchanged. The pain is present in the gluteal and lumbar spine. The quality of the pain is described as shooting. The pain is at a severity of 7/10. The pain is moderate. The symptoms are aggravated by position. Associated symptoms include numbness (left leg goes numb periodically). Pertinent negatives include no bladder incontinence, bowel incontinence, chest pain, fever or headaches. He has tried analgesics (Percocet 5-325 mg q 4hrs PRN) for the symptoms. The treatment provided significant relief.    Past Medical History  Diagnosis Date  . Depression   . Chronic back pain   . Anxiety   . Hyperlipidemia   . Chronic pain     Past Surgical History  Procedure Laterality Date  . Cataract extraction Right 2014    Family History  Problem Relation Age of Onset  . Adopted: Yes  . Family history unknown: Yes    Social History   Social History  . Marital Status: Single    Spouse Name: N/A  . Number of Children: N/A  . Years of Education: N/A   Occupational History  . Not on file.   Social History Main Topics  . Smoking status: Current Every Day Smoker -- 1.00 packs/day    Types: Cigarettes, E-cigarettes  . Smokeless tobacco: Never Used  . Alcohol Use: 1.2 oz/week    2 Cans of beer per week  . Drug Use: No  . Sexual Activity: Not on file   Other Topics Concern  . Not on file   Social History Narrative     Current outpatient prescriptions:  .  ALPRAZolam (XANAX) 0.25 MG tablet, Take 1 tablet (0.25 mg total) by mouth daily as needed., Disp: 30 tablet, Rfl: 0 .  baclofen (LIORESAL) 10 MG tablet, Take 1 tablet (10 mg total) by mouth 4 (four) times daily as needed.,  Disp: 120 tablet, Rfl: 5 .  buPROPion (WELLBUTRIN XL) 300 MG 24 hr tablet, Take 1 tablet (300 mg total) by mouth daily., Disp: 90 tablet, Rfl: 0 .  busPIRone (BUSPAR) 15 MG tablet, Take 1 tablet by mouth 2 (two) times daily., Disp: , Rfl:  .  cephALEXin (KEFLEX) 500 MG capsule, Take 1 capsule (500 mg total) by mouth 3 (three) times daily., Disp: 30 capsule, Rfl: 0 .  citalopram (CELEXA) 40 MG tablet, Take 1 tablet (40 mg total) by mouth daily., Disp: 90 tablet, Rfl: 0 .  ibuprofen (ADVIL,MOTRIN) 800 MG tablet, Take 1 tablet (800 mg total) by mouth daily as needed., Disp: 90 tablet, Rfl: 1 .  orphenadrine (NORFLEX) 100 MG tablet, take 1 tablet by mouth twice a day if needed for muscle spasm, Disp: , Rfl: 0 .  oxyCODONE-acetaminophen (PERCOCET/ROXICET) 5-325 MG tablet, Take 1 tablet by mouth every 4 (four) hours as needed for severe pain. PRN, Disp: 180 tablet, Rfl: 0 .  rosuvastatin (CRESTOR) 10 MG tablet, Take 1 tablet (10 mg total) by mouth daily., Disp: 90 tablet, Rfl: 0  No Known Allergies   Review of Systems  Constitutional: Negative for fever and chills.  Eyes: Negative for blurred vision and double vision.  Respiratory: Negative for cough  and sputum production.   Cardiovascular: Negative for chest pain and palpitations.  Gastrointestinal: Negative for bowel incontinence.  Genitourinary: Negative for bladder incontinence.  Musculoskeletal: Positive for back pain.  Neurological: Positive for numbness (left leg goes numb periodically). Negative for headaches.    Objective  Filed Vitals:   10/04/15 1035  BP: 131/78  Pulse: 83  Temp: 98.6 F (37 C)  TempSrc: Oral  Resp: 17  Height: 5\' 8"  (1.727 m)  Weight: 250 lb (113.399 kg)  SpO2: 95%    Physical Exam  Constitutional: He is oriented to person, place, and time and well-developed, well-nourished, and in no distress.  Cardiovascular: Normal rate and regular rhythm.   Pulmonary/Chest: Effort normal and breath sounds normal.    Musculoskeletal:       Lumbar back: He exhibits tenderness, pain and spasm.       Back:  Neurological: He is alert and oriented to person, place, and time.  Skin: Skin is warm and dry.  Psychiatric: Memory, affect and judgment normal.  Nursing note and vitals reviewed.   Assessment & Plan  1. Lumbar radiculopathy, chronic Chronic LBP, stable on daily opioid therapy. Compliant with controlled substances agreement. Refills provided. - oxyCODONE-acetaminophen (PERCOCET/ROXICET) 5-325 MG tablet; Take 1 tablet by mouth every 4 (four) hours as needed for severe pain. PRN  Dispense: 180 tablet; Refill: 0   [name] [facility] 10/04/2015 10:49 AM

## 2015-10-19 IMAGING — CR CERVICAL SPINE - COMPLETE 4+ VIEW
1 series · 6 of 6 positions shown · non-contrast
Comparison: None.

CLINICAL DATA: Pain ; prior trauma

EXAM:
CERVICAL SPINE  4+ VIEWS

[Series 1: oblique · 0.17mm/px · 6 of 6 slices shown]
[im 1/6]
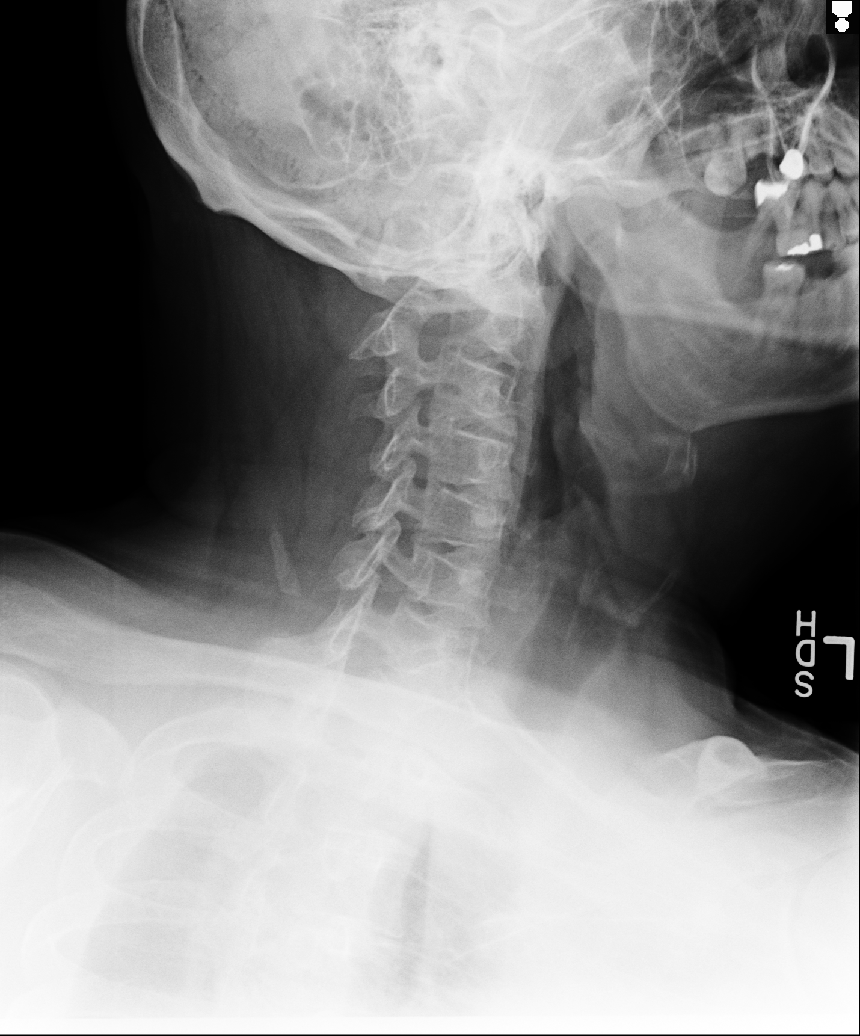
[im 2/6]
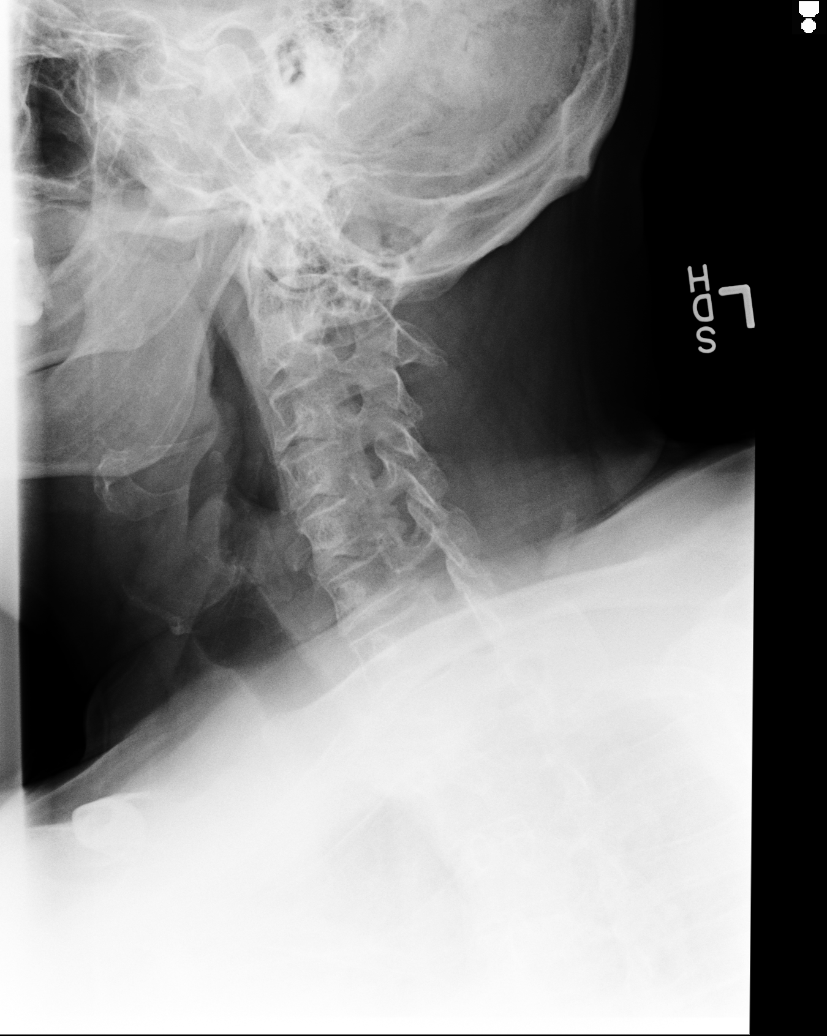
[im 3/6]
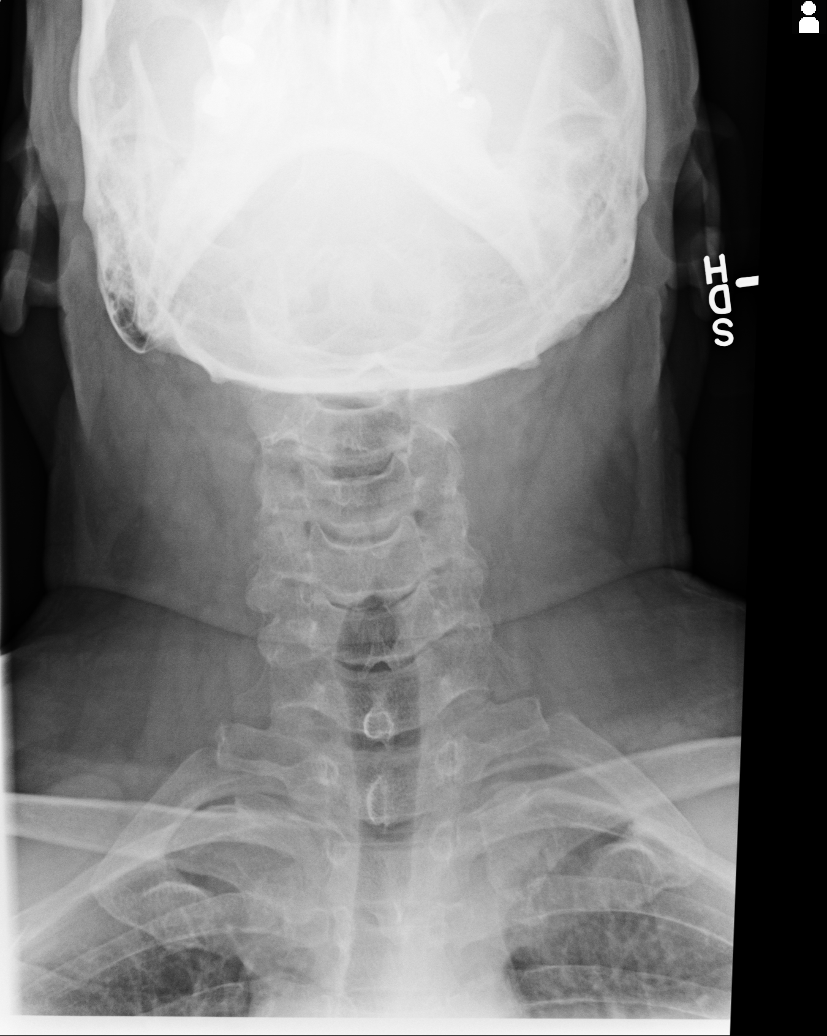
[im 4/6]
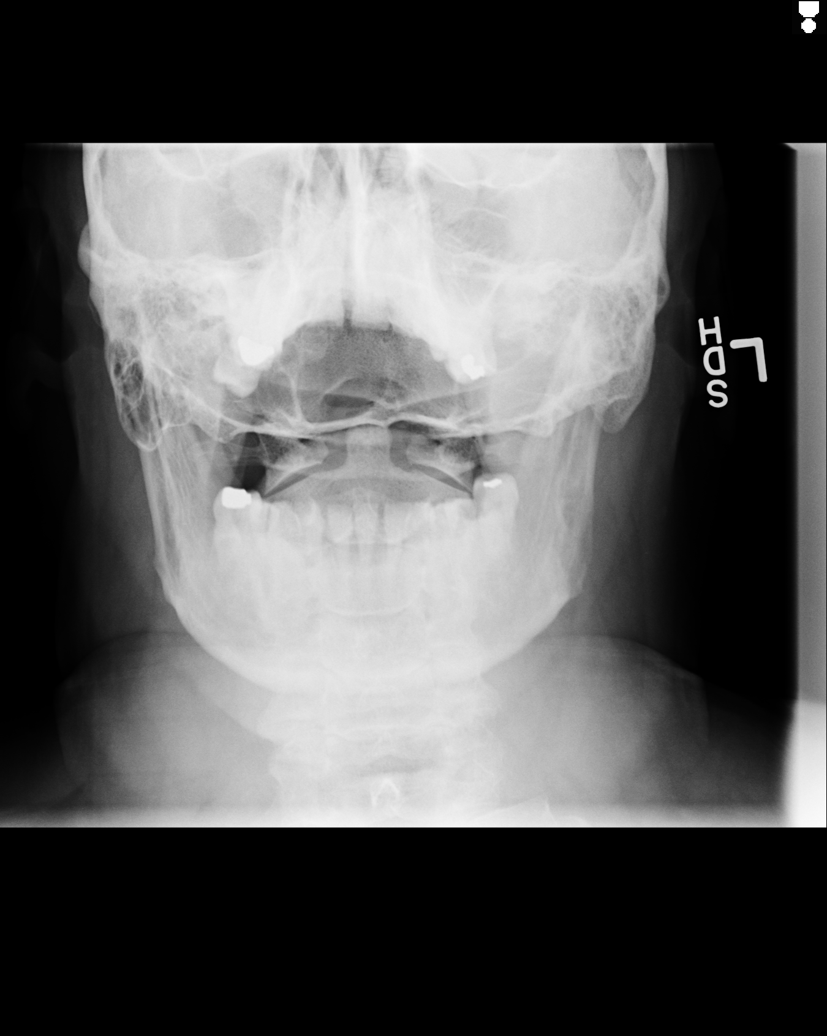
[im 5/6]
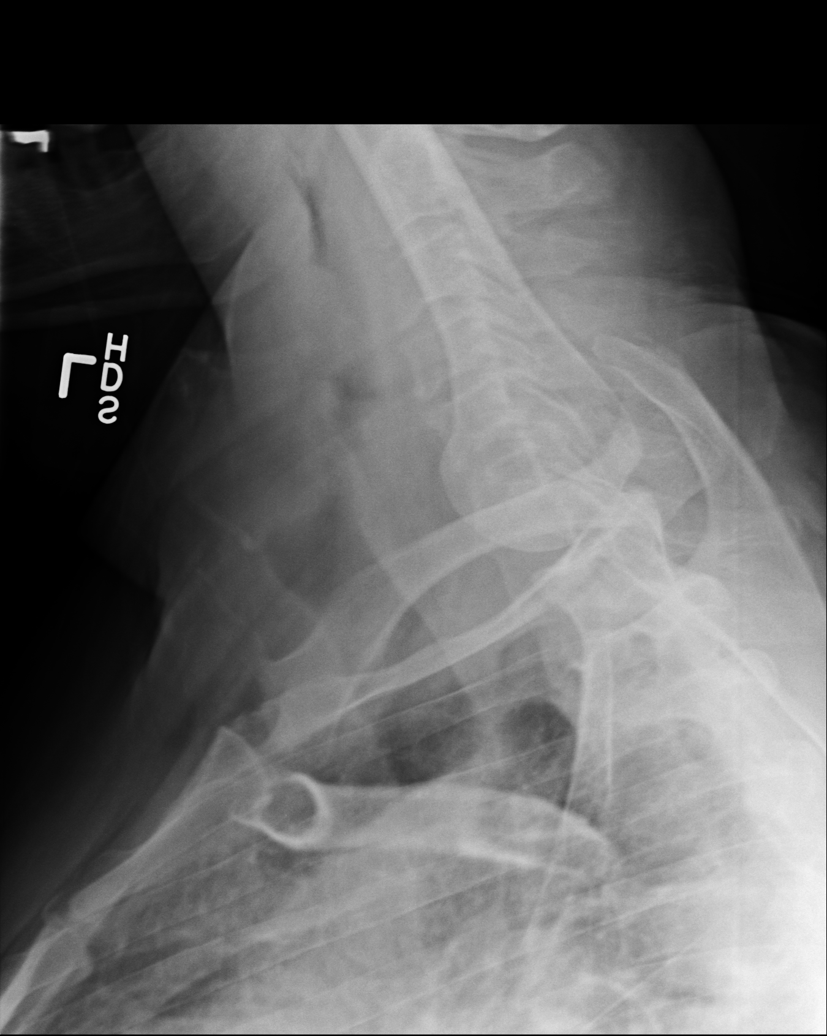
[im 6/6]
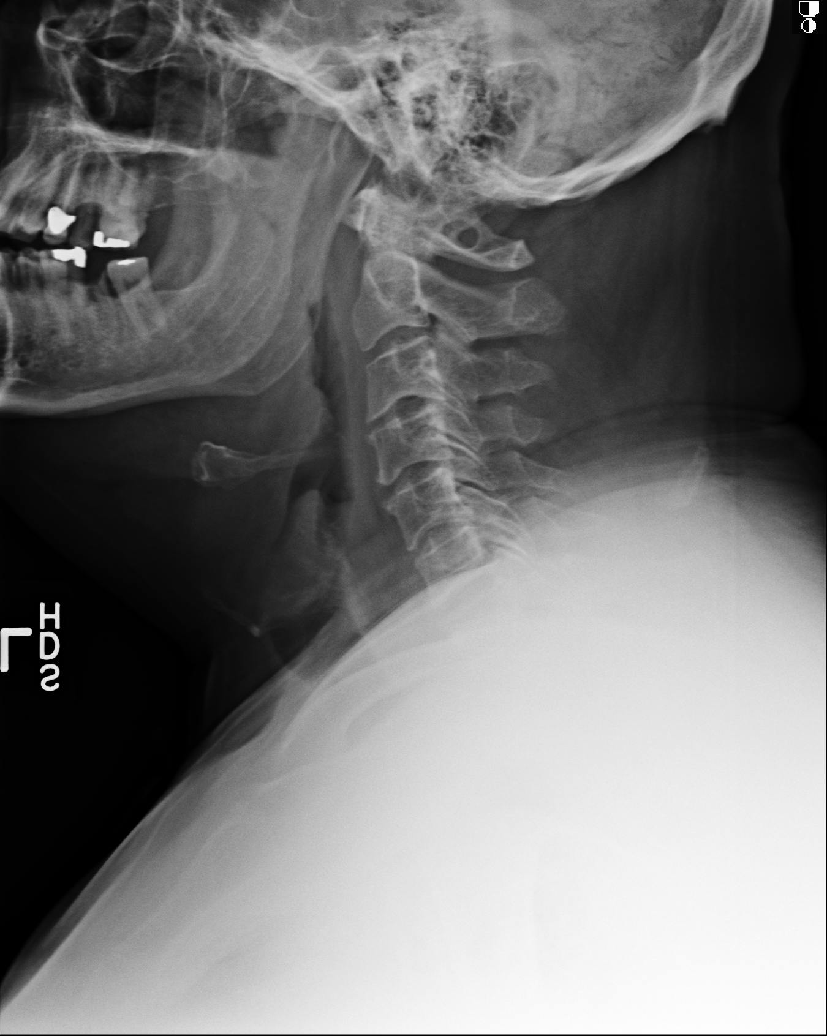

[6 of 6 positions shown; findings below may reference images not displayed]

FINDINGS: Frontal, lateral, open-mouth odontoid, and bilateral oblique views
were obtained. There is no fracture or spondylolisthesis.
Prevertebral soft tissues and predental space regions are normal.
There is mild disc space narrowing at all levels. There is slightly
greater narrowing at C3-4 than elsewhere. There is facet
osteoarthritic change with exit foraminal narrowing at C3-4, C4-5,
and C5-6 bilaterally as well as at C6-7 on the left.
IMPRESSION: Multilevel osteoarthritic change.  No fracture or spondylolisthesis.

## 2015-11-04 ENCOUNTER — Encounter: Payer: Self-pay | Admitting: Family Medicine

## 2015-11-04 ENCOUNTER — Ambulatory Visit (INDEPENDENT_AMBULATORY_CARE_PROVIDER_SITE_OTHER): Payer: Medicare PPO | Admitting: Family Medicine

## 2015-11-04 VITALS — BP 120/78 | HR 78 | Temp 98.9°F | Resp 16 | Ht 68.0 in | Wt 247.5 lb

## 2015-11-04 DIAGNOSIS — M5416 Radiculopathy, lumbar region: Secondary | ICD-10-CM

## 2015-11-04 MED ORDER — OXYCODONE-ACETAMINOPHEN 5-325 MG PO TABS: 1.0000 | ORAL_TABLET | ORAL | Status: DC | PRN

## 2015-11-04 NOTE — Progress Notes (Signed)
Name: John Garrett   MRN: [id number]    DOB: 1961-03-26   Date:11/04/2015       Progress Note  Subjective  Chief Complaint  Chief Complaint  Patient presents with  . Medication Refill  . Pain    back    Back Pain This is a chronic problem. The problem is unchanged. The pain is present in the gluteal and lumbar spine. The quality of the pain is described as shooting. The pain is at a severity of 6/10. The pain is moderate. The symptoms are aggravated by position. Associated symptoms include numbness (left leg goes numb periodically). Pertinent negatives include no bladder incontinence, bowel incontinence, chest pain, fever or headaches. John Garrett has tried analgesics (Percocet 5-325 mg q 4hrs PRN) for the symptoms. The treatment provided significant relief.     Past Medical History  Diagnosis Date  . Depression   . Chronic back pain   . Anxiety   . Hyperlipidemia   . Chronic pain     Past Surgical History  Procedure Laterality Date  . Cataract extraction Right 2014    Family History  Problem Relation Age of Onset  . Adopted: Yes  . Family history unknown: Yes    Social History   Social History  . Marital Status: Single    Spouse Name: N/A  . Number of Children: N/A  . Years of Education: N/A   Occupational History  . Not on file.   Social History Main Topics  . Smoking status: Current Every Day Smoker -- 1.00 packs/day    Types: Cigarettes, E-cigarettes  . Smokeless tobacco: Never Used  . Alcohol Use: 1.2 oz/week    2 Cans of beer per week  . Drug Use: No  . Sexual Activity: Not on file   Other Topics Concern  . Not on file   Social History Narrative     Current outpatient prescriptions:  .  ALPRAZolam (XANAX) 0.25 MG tablet, Take 1 tablet (0.25 mg total) by mouth daily as needed., Disp: 30 tablet, Rfl: 0 .  baclofen (LIORESAL) 10 MG tablet, Take 1 tablet (10 mg total) by mouth 4 (four) times daily as needed., Disp: 120 tablet, Rfl: 5 .  buPROPion  (WELLBUTRIN XL) 300 MG 24 hr tablet, Take 1 tablet (300 mg total) by mouth daily., Disp: 90 tablet, Rfl: 0 .  busPIRone (BUSPAR) 15 MG tablet, Take 1 tablet by mouth 2 (two) times daily., Disp: , Rfl:  .  citalopram (CELEXA) 40 MG tablet, Take 1 tablet (40 mg total) by mouth daily., Disp: 90 tablet, Rfl: 0 .  ibuprofen (ADVIL,MOTRIN) 800 MG tablet, Take 1 tablet (800 mg total) by mouth daily as needed., Disp: 90 tablet, Rfl: 1 .  orphenadrine (NORFLEX) 100 MG tablet, take 1 tablet by mouth twice a day if needed for muscle spasm, Disp: , Rfl: 0 .  oxyCODONE-acetaminophen (PERCOCET/ROXICET) 5-325 MG tablet, Take 1 tablet by mouth every 4 (four) hours as needed for severe pain. PRN, Disp: 180 tablet, Rfl: 0 .  rosuvastatin (CRESTOR) 10 MG tablet, Take 1 tablet (10 mg total) by mouth daily., Disp: 90 tablet, Rfl: 0  No Known Allergies   Review of Systems  Constitutional: Negative for fever.  Cardiovascular: Negative for chest pain.  Gastrointestinal: Negative for bowel incontinence.  Genitourinary: Negative for bladder incontinence.  Musculoskeletal: Positive for back pain.  Neurological: Positive for numbness (left leg goes numb periodically). Negative for headaches.     Objective  Filed Vitals:   11/04/15 0850  BP: 120/78  Pulse: 78  Temp: 98.9 F (37.2 C)  TempSrc: Oral  Resp: 16  Height: 5\' 8"  (1.727 m)  Weight: 247 lb 8 oz (112.265 kg)  SpO2: 97%    Physical Exam  Constitutional: John Garrett is oriented to person, place, and time and well-developed, well-nourished, and in no distress.  Cardiovascular: Normal rate and regular rhythm.   Pulmonary/Chest: Effort normal and breath sounds normal.  Musculoskeletal:       Lumbar back: John Garrett exhibits tenderness, pain and spasm.       Back:  Neurological: John Garrett is alert and oriented to person, place, and time.  Skin: Skin is warm and dry.  Psychiatric: Memory, affect and judgment normal.  Nursing note and vitals reviewed.    Assessment &  Plan  1. Lumbar radiculopathy, chronic Chronic low back pain, stable and controlled on opioid therapy. Patient compliant with controlled substances agreement. - oxyCODONE-acetaminophen (PERCOCET/ROXICET) 5-325 MG tablet; Take 1 tablet by mouth every 4 (four) hours as needed for severe pain. PRN  Dispense: 180 tablet; Refill: 0   John Garrett Asad A[facility] 11/04/2015 8:57 AM

## 2015-12-02 ENCOUNTER — Encounter: Payer: Self-pay | Admitting: Family Medicine

## 2015-12-02 ENCOUNTER — Ambulatory Visit (INDEPENDENT_AMBULATORY_CARE_PROVIDER_SITE_OTHER): Payer: Medicare PPO | Admitting: Family Medicine

## 2015-12-02 VITALS — BP 120/73 | HR 86 | Temp 98.5°F | Resp 17 | Ht 68.0 in | Wt 247.9 lb

## 2015-12-02 DIAGNOSIS — F419 Anxiety disorder, unspecified: Secondary | ICD-10-CM

## 2015-12-02 DIAGNOSIS — F329 Major depressive disorder, single episode, unspecified: Secondary | ICD-10-CM

## 2015-12-02 DIAGNOSIS — M5416 Radiculopathy, lumbar region: Secondary | ICD-10-CM

## 2015-12-02 DIAGNOSIS — E785 Hyperlipidemia, unspecified: Secondary | ICD-10-CM | POA: Diagnosis not present

## 2015-12-02 MED ORDER — OXYCODONE-ACETAMINOPHEN 5-325 MG PO TABS: 1.0000 | ORAL_TABLET | ORAL | Status: DC | PRN

## 2015-12-02 MED ORDER — ROSUVASTATIN CALCIUM 10 MG PO TABS: 10.0000 mg | ORAL_TABLET | Freq: Every day | ORAL | Status: DC

## 2015-12-02 MED ORDER — BUPROPION HCL ER (XL) 300 MG PO TB24: 300.0000 mg | ORAL_TABLET | Freq: Every day | ORAL | Status: DC

## 2015-12-02 MED ORDER — ALPRAZOLAM 0.25 MG PO TABS: 0.2500 mg | ORAL_TABLET | Freq: Every day | ORAL | Status: DC | PRN

## 2015-12-02 NOTE — Progress Notes (Signed)
Name: John Garrett   MRN: [id number]    DOB: 1960/12/20   Date:12/02/2015       Progress Note  Subjective  Chief Complaint  Chief Complaint  Patient presents with  . Follow-up    1 mo  . Hyperlipidemia  . Medication Refill    Hyperlipidemia This is a chronic problem. The problem is controlled. Exacerbating diseases include obesity. Pertinent negatives include no leg pain, myalgias or shortness of breath. Current antihyperlipidemic treatment includes statins. There are no compliance problems.   Anxiety Presents for follow-up visit. The problem has been gradually improving. Symptoms include insomnia, irritability and nervous/anxious behavior. Patient reports no nausea, palpitations or shortness of breath.   His past medical history is significant for depression. There is no history of anxiety/panic attacks. Past treatments include benzodiazephines.  Depression      The patient presents with depression.  This is a chronic problem.  The onset quality is gradual.   Associated symptoms include fatigue, insomnia, irritable, decreased interest and sad.  Associated symptoms include no myalgias.  Past treatments include SSRIs - Selective serotonin reuptake inhibitors.  Compliance with treatment is good.  Past medical history includes anxiety and depression.   Back Pain This is a chronic problem. The problem is unchanged. The pain is present in the lumbar spine. The pain is at a severity of 5/10. The symptoms are aggravated by bending, position and standing. Pertinent negatives include no leg pain. He has tried analgesics for the symptoms.     Past Medical History  Diagnosis Date  . Depression   . Chronic back pain   . Anxiety   . Hyperlipidemia   . Chronic pain     Past Surgical History  Procedure Laterality Date  . Cataract extraction Right 2014    Family History  Problem Relation Age of Onset  . Adopted: Yes  . Family history unknown: Yes    Social History   Social History  .  Marital Status: Single    Spouse Name: N/A  . Number of Children: N/A  . Years of Education: N/A   Occupational History  . Not on file.   Social History Main Topics  . Smoking status: Current Every Day Smoker -- 1.00 packs/day    Types: Cigarettes, E-cigarettes  . Smokeless tobacco: Never Used  . Alcohol Use: 1.2 oz/week    2 Cans of beer per week  . Drug Use: No  . Sexual Activity: Not on file   Other Topics Concern  . Not on file   Social History Narrative     Current outpatient prescriptions:  .  ALPRAZolam (XANAX) 0.25 MG tablet, Take 1 tablet (0.25 mg total) by mouth daily as needed., Disp: 30 tablet, Rfl: 0 .  baclofen (LIORESAL) 10 MG tablet, Take 1 tablet (10 mg total) by mouth 4 (four) times daily as needed., Disp: 120 tablet, Rfl: 5 .  buPROPion (WELLBUTRIN XL) 300 MG 24 hr tablet, Take 1 tablet (300 mg total) by mouth daily., Disp: 90 tablet, Rfl: 0 .  busPIRone (BUSPAR) 15 MG tablet, Take 1 tablet by mouth 2 (two) times daily., Disp: , Rfl:  .  citalopram (CELEXA) 40 MG tablet, Take 1 tablet (40 mg total) by mouth daily., Disp: 90 tablet, Rfl: 0 .  ibuprofen (ADVIL,MOTRIN) 800 MG tablet, Take 1 tablet (800 mg total) by mouth daily as needed., Disp: 90 tablet, Rfl: 1 .  orphenadrine (NORFLEX) 100 MG tablet, take 1 tablet by mouth twice a day if needed  for muscle spasm, Disp: , Rfl: 0 .  oxyCODONE-acetaminophen (PERCOCET/ROXICET) 5-325 MG tablet, Take 1 tablet by mouth every 4 (four) hours as needed for severe pain. PRN, Disp: 180 tablet, Rfl: 0 .  rosuvastatin (CRESTOR) 10 MG tablet, Take 1 tablet (10 mg total) by mouth daily., Disp: 90 tablet, Rfl: 0  No Known Allergies   Review of Systems  Constitutional: Positive for irritability and fatigue.  Respiratory: Negative for shortness of breath.   Cardiovascular: Negative for palpitations.  Gastrointestinal: Negative for nausea.  Musculoskeletal: Positive for back pain and joint pain. Negative for myalgias.    Psychiatric/Behavioral: Positive for depression. The patient is nervous/anxious and has insomnia.     Objective  Filed Vitals:   12/02/15 1008  BP: 120/73  Pulse: 86  Temp: 98.5 F (36.9 C)  TempSrc: Oral  Resp: 17  Height: 5\' 8"  (1.727 m)  Weight: 247 lb 14.4 oz (112.447 kg)  SpO2: 97%    Physical Exam  Constitutional: He is oriented to person, place, and time and well-developed, well-nourished, and in no distress. He is irritable.  Cardiovascular: Normal rate and regular rhythm.   Pulmonary/Chest: Effort normal and breath sounds normal.  Musculoskeletal:       Lumbar back: He exhibits tenderness, pain and spasm.       Back:  Neurological: He is alert and oriented to person, place, and time.  Skin: Skin is warm and dry.  Psychiatric: Memory, affect and judgment normal.  Nursing note and vitals reviewed.    Assessment & Plan  1. Anxiety Continue on alprazolam 0.25 mg daily as needed. - ALPRAZolam (XANAX) 0.25 MG tablet; Take 1 tablet (0.25 mg total) by mouth daily as needed.  Dispense: 30 tablet; Refill: 0  2. Hyperlipidemia  - rosuvastatin (CRESTOR) 10 MG tablet; Take 1 tablet (10 mg total) by mouth daily.  Dispense: 90 tablet; Refill: 0 - Lipid Profile - Comprehensive Metabolic Panel (CMET)  3. Lumbar radiculopathy, chronic Chronic low back pain, stable on opioid therapy. Refills provided. - oxyCODONE-acetaminophen (PERCOCET/ROXICET) 5-325 MG tablet; Take 1 tablet by mouth every 4 (four) hours as needed for severe pain. PRN  Dispense: 180 tablet; Refill: 0  4. Major depression, chronic (HCC) Stable on Wellbutrin XL 300 mg daily. - buPROPion (WELLBUTRIN XL) 300 MG 24 hr tablet; Take 1 tablet (300 mg total) by mouth daily.  Dispense: 90 tablet; Refill: 0   Estelita Iten [facility] 12/02/2015 10:35 AM

## 2016-01-02 ENCOUNTER — Ambulatory Visit: Payer: Medicare PPO | Admitting: Family Medicine

## 2016-10-22 ENCOUNTER — Encounter: Payer: Self-pay | Admitting: Family Medicine

## 2016-10-22 ENCOUNTER — Ambulatory Visit (INDEPENDENT_AMBULATORY_CARE_PROVIDER_SITE_OTHER): Payer: Medicare PPO | Admitting: Family Medicine

## 2016-10-22 DIAGNOSIS — M5416 Radiculopathy, lumbar region: Secondary | ICD-10-CM

## 2016-10-22 DIAGNOSIS — F419 Anxiety disorder, unspecified: Secondary | ICD-10-CM | POA: Diagnosis not present

## 2016-10-22 MED ORDER — OXYCODONE-ACETAMINOPHEN 5-325 MG PO TABS: 1.0000 | ORAL_TABLET | ORAL | 0 refills | Status: DC | PRN

## 2016-10-22 MED ORDER — ALPRAZOLAM 0.25 MG PO TABS: 0.2500 mg | ORAL_TABLET | Freq: Every day | ORAL | 0 refills | Status: DC | PRN

## 2016-10-22 NOTE — Progress Notes (Signed)
Name: John Garrett   MRN: [id number]    DOB: 1960/11/11   Date:10/22/2016       Progress Note  Subjective  Chief Complaint  Chief Complaint  Patient presents with  . Medication Refill    Back Pain  This is a chronic problem. The problem is unchanged. The pain is present in the gluteal and lumbar spine. The quality of the pain is described as shooting. The pain is at a severity of 8/10. The pain is moderate. The symptoms are aggravated by position. Associated symptoms include numbness. Pertinent negatives include no bladder incontinence, bowel incontinence, chest pain, fever or headaches. He has tried analgesics (Percocet 5-325 mg q 4hrs PRN, has been away in [place] trying to help her daughter from losing her children in a legal battle between her and her ex-husband. Was seeing a physician in [place] who was writing the opioid prescriptions.) for the symptoms. The treatment provided significant relief.  Anxiety  Presents for initial visit. The problem has been unchanged. Symptoms include depressed mood, insomnia, nervous/anxious behavior and panic. Patient reports no chest pain or shortness of breath. The symptoms are aggravated by family issues.   Past treatments include benzodiazephines, SSRIs and non-benzodiazephine anxiolytics.   Past Medical History:  Diagnosis Date  . Anxiety   . Chronic back pain   . Chronic pain   . Depression   . Hyperlipidemia     Past Surgical History:  Procedure Laterality Date  . CATARACT EXTRACTION Right 2014    Family History  Problem Relation Age of Onset  . Adopted: Yes  . Family history unknown: Yes    Social History   Social History  . Marital status: Single    Spouse name: N/A  . Number of children: N/A  . Years of education: N/A   Occupational History  . Not on file.   Social History Main Topics  . Smoking status: Current Every Day Smoker    Packs/day: 1.00    Types: Cigarettes, E-cigarettes  . Smokeless tobacco: Never Used   . Alcohol use 1.2 oz/week    2 Cans of beer per week  . Drug use: No  . Sexual activity: Not on file   Other Topics Concern  . Not on file   Social History Narrative  . No narrative on file     Current Outpatient Prescriptions:  .  ALPRAZolam (XANAX) 0.25 MG tablet, Take 1 tablet (0.25 mg total) by mouth daily as needed., Disp: 30 tablet, Rfl: 0 .  buPROPion (WELLBUTRIN XL) 300 MG 24 hr tablet, Take 1 tablet (300 mg total) by mouth daily., Disp: 90 tablet, Rfl: 0 .  busPIRone (BUSPAR) 15 MG tablet, Take 1 tablet by mouth 2 (two) times daily., Disp: , Rfl:  .  citalopram (CELEXA) 40 MG tablet, Take 1 tablet (40 mg total) by mouth daily., Disp: 90 tablet, Rfl: 0 .  ibuprofen (ADVIL,MOTRIN) 800 MG tablet, Take 1 tablet (800 mg total) by mouth daily as needed., Disp: 90 tablet, Rfl: 1 .  orphenadrine (NORFLEX) 100 MG tablet, take 1 tablet by mouth twice a day if needed for muscle spasm, Disp: , Rfl: 0 .  oxyCODONE-acetaminophen (PERCOCET/ROXICET) 5-325 MG tablet, Take 1 tablet by mouth every 4 (four) hours as needed for severe pain. PRN, Disp: 180 tablet, Rfl: 0  No Known Allergies   Review of Systems  Constitutional: Negative for fever.  Respiratory: Negative for shortness of breath.   Cardiovascular: Negative for chest pain.  Gastrointestinal: Negative for bowel  incontinence.  Genitourinary: Negative for bladder incontinence.  Musculoskeletal: Positive for back pain.  Neurological: Positive for numbness. Negative for headaches.  Psychiatric/Behavioral: The patient is nervous/anxious and has insomnia.      Objective  Vitals:   10/22/16 1118  BP: 128/72  Pulse: 76  Resp: 18  Temp: 98.9 F (37.2 C)  TempSrc: Oral  SpO2: 96%  Weight: 257 lb 9.6 oz (116.8 kg)  Height: 5\' 8"  (1.727 m)    Physical Exam  Constitutional: He is oriented to person, place, and time and well-developed, well-nourished, and in no distress.  Cardiovascular: Normal rate, regular rhythm and normal  heart sounds.   No murmur heard. Pulmonary/Chest: Effort normal and breath sounds normal. He has no wheezes.  Abdominal: Bowel sounds are normal.  Neurological: He is alert and oriented to person, place, and time.  Psychiatric: Mood, memory, affect and judgment normal.  Nursing note and vitals reviewed.      Assessment & Plan  1. Anxiety Stable, we will request records from previous treating physician in [place]. - ALPRAZolam (XANAX) 0.25 MG tablet; Take 1 tablet (0.25 mg total) by mouth daily as needed.  Dispense: 30 tablet; Refill: 0  2. Lumbar radiculopathy, chronic Dr. Jake Michaelis controlled substances registry research was conducted, she appears to verify patient's narrative. He has been residing in [place] for the past many months him broiled in the legal battle in support off her daughter who is seeking custody of her children. He was receiving Percocet from a physician in [place]. We'll request records and refill for 1 month provided. - oxyCODONE-acetaminophen (PERCOCET/ROXICET) 5-325 MG tablet; Take 1 tablet by mouth every 4 (four) hours as needed for severe pain. PRN  Dispense: 180 tablet; Refill: 0  Syed [facility] 10/22/2016 11:54 AM

## 2016-11-22 ENCOUNTER — Ambulatory Visit (INDEPENDENT_AMBULATORY_CARE_PROVIDER_SITE_OTHER): Payer: Medicare PPO | Admitting: Family Medicine

## 2016-11-22 ENCOUNTER — Encounter: Payer: Self-pay | Admitting: Family Medicine

## 2016-11-22 VITALS — BP 130/80 | HR 73 | Temp 97.8°F | Resp 16 | Ht 68.0 in | Wt 259.2 lb

## 2016-11-22 DIAGNOSIS — M5416 Radiculopathy, lumbar region: Secondary | ICD-10-CM | POA: Diagnosis not present

## 2016-11-22 DIAGNOSIS — N529 Male erectile dysfunction, unspecified: Secondary | ICD-10-CM

## 2016-11-22 MED ORDER — OXYCODONE-ACETAMINOPHEN 5-325 MG PO TABS: 1.0000 | ORAL_TABLET | ORAL | 0 refills | Status: DC | PRN

## 2016-11-22 NOTE — Progress Notes (Signed)
Name: John Garrett   MRN: [id number]    DOB: 01-26-1961   Date:11/22/2016       Progress Note  Subjective  Chief Complaint  Chief Complaint  Patient presents with  . Follow-up    1 mo  . Medication Refill    Back Pain  This is a chronic problem. The problem is unchanged. The pain is present in the gluteal and lumbar spine. The quality of the pain is described as shooting. The pain radiates to the right thigh and right knee. The pain is at a severity of 7/10. The pain is moderate. The symptoms are aggravated by position and sitting (prolonged sitting makes it worse, standing for some time hurts his back). Pertinent negatives include no bladder incontinence, bowel incontinence, dysuria or fever. He has tried analgesics (Percocet 5-325 mg q 4hrs PRN, has been away in [place] trying to help her daughter from losing her children in a legal battle between her and her ex-husband. Was seeing a physician in [place] who was writing the opioid prescriptions.) for the symptoms. The treatment provided significant relief.  Erectile Dysfunction  This is a recurrent problem. The problem has been gradually worsening since onset. The nature of his difficulty is achieving erection. Irritative symptoms do not include frequency or urgency. Obstructive symptoms do not include dribbling, a slower stream or straining. Pertinent negatives include no chills or dysuria. Past treatments include tadalafil (Has tried Levitra in the past many years ago).     Past Medical History:  Diagnosis Date  . Anxiety   . Chronic back pain   . Chronic pain   . Depression   . Hyperlipidemia     Past Surgical History:  Procedure Laterality Date  . CATARACT EXTRACTION Right 2014    Family History  Problem Relation Age of Onset  . Adopted: Yes  . Family history unknown: Yes    Social History   Social History  . Marital status: Single    Spouse name: N/A  . Number of children: N/A  . Years of education: N/A    Occupational History  . Not on file.   Social History Main Topics  . Smoking status: Current Every Day Smoker    Packs/day: 1.00    Types: Cigarettes, E-cigarettes  . Smokeless tobacco: Never Used  . Alcohol use 1.2 oz/week    2 Cans of beer per week  . Drug use: No  . Sexual activity: Not on file   Other Topics Concern  . Not on file   Social History Narrative  . No narrative on file     Current Outpatient Prescriptions:  .  ALPRAZolam (XANAX) 0.25 MG tablet, Take 1 tablet (0.25 mg total) by mouth daily as needed., Disp: 30 tablet, Rfl: 0 .  buPROPion (WELLBUTRIN XL) 300 MG 24 hr tablet, Take 1 tablet (300 mg total) by mouth daily., Disp: 90 tablet, Rfl: 0 .  busPIRone (BUSPAR) 15 MG tablet, Take 1 tablet by mouth 2 (two) times daily., Disp: , Rfl:  .  citalopram (CELEXA) 40 MG tablet, Take 1 tablet (40 mg total) by mouth daily., Disp: 90 tablet, Rfl: 0 .  ibuprofen (ADVIL,MOTRIN) 800 MG tablet, Take 1 tablet (800 mg total) by mouth daily as needed., Disp: 90 tablet, Rfl: 1 .  orphenadrine (NORFLEX) 100 MG tablet, take 1 tablet by mouth twice a day if needed for muscle spasm, Disp: , Rfl: 0 .  oxyCODONE-acetaminophen (PERCOCET/ROXICET) 5-325 MG tablet, Take 1 tablet by mouth every 4 (four)  hours as needed for severe pain. PRN, Disp: 180 tablet, Rfl: 0  No Known Allergies   Review of Systems  Constitutional: Negative for chills and fever.  Gastrointestinal: Negative for bowel incontinence.  Genitourinary: Negative for bladder incontinence, dysuria, frequency and urgency.  Musculoskeletal: Positive for back pain.    Objective  Vitals:   11/22/16 1124  BP: 130/80  Pulse: 73  Resp: 16  Temp: 97.8 F (36.6 C)  TempSrc: Oral  SpO2: 94%  Weight: 259 lb 3.2 oz (117.6 kg)  Height: 5\' 8"  (1.727 m)    Physical Exam  Constitutional: He is oriented to person, place, and time and well-developed, well-nourished, and in no distress.  HENT:  Head: Normocephalic and  atraumatic.  Cardiovascular: Normal rate, regular rhythm and normal heart sounds.   No murmur heard. Pulmonary/Chest: Effort normal and breath sounds normal. He has no wheezes.  Musculoskeletal:       Lumbar back: He exhibits tenderness, pain and spasm.       Back:  Neurological: He is alert and oriented to person, place, and time.  Psychiatric: Mood, memory, affect and judgment normal.  Nursing note and vitals reviewed.    Assessment & Plan  1. Lumbar radiculopathy, chronic Stable, responsive to Percocet taken every 4 hours as needed, patient compliant with controlled substances agreement. Refills provided - oxyCODONE-acetaminophen (PERCOCET/ROXICET) 5-325 MG tablet; Take 1 tablet by mouth every 4 (four) hours as needed for severe pain. PRN  Dispense: 180 tablet; Refill: 0  2. Erectile dysfunction, unspecified erectile dysfunction type According to EPIC, Levitra, as Viagra, or Cialis are not covered based on patient's insurance. Patient did not want any prescription that he may not be able to afford. We will try to request samples.   John Garrett [facility] 11/22/2016 12:03 PM

## 2016-12-20 ENCOUNTER — Ambulatory Visit (INDEPENDENT_AMBULATORY_CARE_PROVIDER_SITE_OTHER): Payer: Medicare PPO | Admitting: Family Medicine

## 2016-12-20 ENCOUNTER — Encounter: Payer: Self-pay | Admitting: Family Medicine

## 2016-12-20 DIAGNOSIS — M5416 Radiculopathy, lumbar region: Secondary | ICD-10-CM

## 2016-12-20 MED ORDER — OXYCODONE-ACETAMINOPHEN 5-325 MG PO TABS: 1.0000 | ORAL_TABLET | ORAL | 0 refills | Status: DC | PRN

## 2016-12-20 NOTE — Progress Notes (Signed)
Name: John Garrett   MRN: [id number]    DOB: 07/02/1961   Date:12/20/2016       Progress Note  Subjective  Chief Complaint  Chief Complaint  Patient presents with  . Medication Refill    Back Pain  This is a chronic problem. The problem is unchanged. The pain is present in the gluteal and lumbar spine. The quality of the pain is described as shooting. The pain radiates to the right thigh and right knee. The pain is at a severity of 6/10. The pain is moderate. The symptoms are aggravated by position and sitting (prolonged sitting makes it worse, standing for some time hurts his back). Pertinent negatives include no bladder incontinence, bowel incontinence or fever. He has tried analgesics for the symptoms. The treatment provided significant relief.    Past Medical History:  Diagnosis Date  . Anxiety   . Chronic back pain   . Chronic pain   . Depression   . Hyperlipidemia     Past Surgical History:  Procedure Laterality Date  . CATARACT EXTRACTION Right 2014    Family History  Problem Relation Age of Onset  . Adopted: Yes  . Family history unknown: Yes    Social History   Social History  . Marital status: Single    Spouse name: N/A  . Number of children: N/A  . Years of education: N/A   Occupational History  . Not on file.   Social History Main Topics  . Smoking status: Current Every Day Smoker    Packs/day: 1.00    Types: Cigarettes, E-cigarettes  . Smokeless tobacco: Never Used  . Alcohol use 1.2 oz/week    2 Cans of beer per week  . Drug use: No  . Sexual activity: Not on file   Other Topics Concern  . Not on file   Social History Narrative  . No narrative on file     Current Outpatient Prescriptions:  .  ALPRAZolam (XANAX) 0.25 MG tablet, Take 1 tablet (0.25 mg total) by mouth daily as needed., Disp: 30 tablet, Rfl: 0 .  buPROPion (WELLBUTRIN XL) 300 MG 24 hr tablet, Take 1 tablet (300 mg total) by mouth daily., Disp: 90 tablet, Rfl: 0 .  busPIRone  (BUSPAR) 15 MG tablet, Take 1 tablet by mouth 2 (two) times daily., Disp: , Rfl:  .  citalopram (CELEXA) 40 MG tablet, Take 1 tablet (40 mg total) by mouth daily., Disp: 90 tablet, Rfl: 0 .  ibuprofen (ADVIL,MOTRIN) 800 MG tablet, Take 1 tablet (800 mg total) by mouth daily as needed., Disp: 90 tablet, Rfl: 1 .  orphenadrine (NORFLEX) 100 MG tablet, take 1 tablet by mouth twice a day if needed for muscle spasm, Disp: , Rfl: 0 .  oxyCODONE-acetaminophen (PERCOCET/ROXICET) 5-325 MG tablet, Take 1 tablet by mouth every 4 (four) hours as needed for severe pain. PRN, Disp: 180 tablet, Rfl: 0 .  rosuvastatin (CRESTOR) 20 MG tablet, , Disp: , Rfl:   No Known Allergies   Review of Systems  Constitutional: Negative for fever.  Gastrointestinal: Negative for bowel incontinence.  Genitourinary: Negative for bladder incontinence.  Musculoskeletal: Positive for back pain.      Objective  Vitals:   12/20/16 1037  BP: 126/84  Pulse: 78  Resp: 18  Temp: 98.9 F (37.2 C)  SpO2: 95%  Weight: 259 lb 11.2 oz (117.8 kg)  Height: 5\' 8"  (1.727 m)    Physical Exam  Constitutional: He is oriented to person, place, and time  and well-developed, well-nourished, and in no distress.  HENT:  Head: Normocephalic and atraumatic.  Cardiovascular: Normal rate, regular rhythm and normal heart sounds.   No murmur heard. Pulmonary/Chest: Effort normal and breath sounds normal. He has no wheezes.  Musculoskeletal:       Lumbar back: He exhibits tenderness, pain and spasm.       Back:  Neurological: He is alert and oriented to person, place, and time.  Psychiatric: Mood, memory, affect and judgment normal.  Nursing note and vitals reviewed.     Assessment & Plan  1. Lumbar radiculopathy, chronic Stable and responsive to opioid therapy, patient compliant with controlled substances agreement. Refills provided - oxyCODONE-acetaminophen (PERCOCET/ROXICET) 5-325 MG tablet; Take 1 tablet by mouth every 4  (four) hours as needed for severe pain. PRN  Dispense: 180 tablet; Refill: 0   Alyanah Elliott [facility] 12/20/2016 10:56 AM

## 2017-01-21 ENCOUNTER — Ambulatory Visit (INDEPENDENT_AMBULATORY_CARE_PROVIDER_SITE_OTHER): Payer: Medicare PPO | Admitting: Family Medicine

## 2017-01-21 ENCOUNTER — Ambulatory Visit (INDEPENDENT_AMBULATORY_CARE_PROVIDER_SITE_OTHER): Payer: Medicare PPO

## 2017-01-21 VITALS — BP 142/88 | HR 64 | Temp 97.6°F | Ht 68.0 in | Wt 260.4 lb

## 2017-01-21 DIAGNOSIS — M5416 Radiculopathy, lumbar region: Secondary | ICD-10-CM | POA: Diagnosis not present

## 2017-01-21 DIAGNOSIS — Z125 Encounter for screening for malignant neoplasm of prostate: Secondary | ICD-10-CM

## 2017-01-21 DIAGNOSIS — Z1211 Encounter for screening for malignant neoplasm of colon: Secondary | ICD-10-CM

## 2017-01-21 DIAGNOSIS — Z0000 Encounter for general adult medical examination without abnormal findings: Secondary | ICD-10-CM

## 2017-01-21 MED ORDER — OXYCODONE-ACETAMINOPHEN 5-325 MG PO TABS: 1.0000 | ORAL_TABLET | ORAL | 0 refills | Status: DC | PRN

## 2017-01-21 NOTE — Progress Notes (Signed)
Subjective:   John Garrett is a 56 y.o. male who presents for Medicare Annual/Subsequent preventive examination.  Review of Systems:  N/A  Cardiac Risk Factors include: advanced age (>32men, >84 women);dyslipidemia;obesity (BMI >30kg/m2);male gender;smoking/ tobacco exposure     Objective:    Vitals: BP (!) 142/88 (BP Location: Left Arm)   Pulse 64   Temp 97.6 F (36.4 C) (Oral)   Ht 5\' 8"  (1.727 m)   Wt 260 lb 6.4 oz (118.1 kg)   BMI 39.59 kg/m   Body mass index is 39.59 kg/m.  Tobacco History  Smoking Status  . Current Every Day Smoker  . Packs/day: 0.00  . Types: E-cigarettes  Smokeless Tobacco  . Former Systems developer  . Types: Chew    Comment: decreasing nicotene in vapor     Ready to quit: No Counseling given: No   Past Medical History:  Diagnosis Date  . Anxiety   . Chronic back pain   . Chronic pain   . Depression   . Hyperlipidemia    Past Surgical History:  Procedure Laterality Date  . CATARACT EXTRACTION Right 2014   Family History  Problem Relation Age of Onset  . Adopted: Yes  . Family history unknown: Yes   History  Sexual Activity  . Sexual activity: Not on file    Outpatient Encounter Prescriptions as of 01/21/2017  Medication Sig  . ALPRAZolam (XANAX) 0.25 MG tablet Take 1 tablet (0.25 mg total) by mouth daily as needed.  Marland Kitchen buPROPion (WELLBUTRIN XL) 300 MG 24 hr tablet Take 1 tablet (300 mg total) by mouth daily.  . busPIRone (BUSPAR) 15 MG tablet Take 1 tablet by mouth 2 (two) times daily.  . citalopram (CELEXA) 40 MG tablet Take 1 tablet (40 mg total) by mouth daily.  Marland Kitchen ibuprofen (ADVIL,MOTRIN) 800 MG tablet Take 1 tablet (800 mg total) by mouth daily as needed. (Patient taking differently: Take 800 mg by mouth daily as needed. )  . oxyCODONE-acetaminophen (PERCOCET/ROXICET) 5-325 MG tablet Take 1 tablet by mouth every 4 (four) hours as needed for severe pain. PRN  . rosuvastatin (CRESTOR) 20 MG tablet 20 mg daily.   . orphenadrine  (NORFLEX) 100 MG tablet take 1 tablet by mouth twice a day if needed for muscle spasm   No facility-administered encounter medications on file as of 01/21/2017.     Activities of Daily Living In your present state of health, do you have any difficulty performing the following activities: 01/21/2017 11/22/2016  Hearing? N N  Vision? N N  Difficulty concentrating or making decisions? N N  Walking or climbing stairs? Y N  Dressing or bathing? N N  Doing errands, shopping? N N  Preparing Food and eating ? N -  Using the Toilet? N -  In the past six months, have you accidently leaked urine? N -  Do you have problems with loss of bowel control? Y -  Managing your Medications? N -  Managing your Finances? N -  Housekeeping or managing your Housekeeping? N -  Some recent data might be hidden    Patient Care Team: Roselee Nova, MD as PCP - General (Family Medicine)   Assessment:     Exercise Activities and Dietary recommendations Current Exercise Habits: The patient does not participate in regular exercise at present, Exercise limited by: orthopedic condition(s)  Goals    . Eat more fruits and vegetables          Recommend eating more vegetables in the  daily diet.       Fall Risk Fall Risk  01/21/2017 11/22/2016 12/02/2015 10/04/2015 09/02/2015  Falls in the past year? No No No No No   Depression Screen PHQ 2/9 Scores 01/21/2017 11/22/2016 12/02/2015 10/04/2015  PHQ - 2 Score 1 0 0 0    Cognitive Function     6CIT Screen 01/21/2017  What Year? 0 points  What month? 0 points  What time? 0 points  Count back from 20 0 points  Months in reverse 4 points  Repeat phrase 2 points  Total Score 6    Immunization History  Administered Date(s) Administered  . Influenza,inj,Quad PF,36+ Mos 09/02/2015  . Influenza-Unspecified 07/01/2014  . Pneumococcal Polysaccharide-23 07/31/2013  . Tdap 08/02/2014, 09/01/2015   Screening Tests Health Maintenance  Topic Date Due  . Hepatitis C  Screening  10-Feb-1961  . HIV Screening  09/15/1976  . INFLUENZA VACCINE  05/01/2017  . COLONOSCOPY  06/05/2023  . TETANUS/TDAP  08/31/2025      Plan:  I have personally reviewed and addressed the Medicare Annual Wellness questionnaire and have noted the following in the patient's chart:  A. Medical and social history B. Use of alcohol, tobacco or illicit drugs  C. Current medications and supplements D. Functional ability and status E.  Nutritional status F.  Physical activity G. Advance directives H. List of other physicians I.  Hospitalizations, surgeries, and ER visits in previous 12 months J.  [place] such as hearing and vision if needed, cognitive and depression L. Referrals and appointments - none  In addition, I have reviewed and discussed with patient certain preventive protocols, quality metrics, and best practice recommendations. A written personalized care plan for preventive services as well as general preventive health recommendations were provided to patient.  See attached scanned questionnaire for additional information.   Signed,  Fabio Neighbors, LPN Nurse Health Advisor   MD Recommendations: Pt declined HIV and Hep C screening today. I, as supervising physician, have reviewed the nurse health advisor's Medicare Wellness Visit note for this patient and concur with the findings and recommendations listed above.  Signed Syed Asad A. Manuella Ghazi MD Attending Physician.

## 2017-01-21 NOTE — Patient Instructions (Signed)
[name] , Thank you for taking time to come for your Medicare Wellness Visit. I appreciate your ongoing commitment to your health goals. Please review the following plan we discussed and let me know if I can assist you in the future.   Screening recommendations/referrals: Colonoscopy: last sone 06/04/13 Recommended yearly ophthalmology/optometry visit for glaucoma screening and checkup Recommended yearly dental visit for hygiene and checkup  Vaccinations: Influenza vaccine: declined Pneumococcal vaccine: Pnemovax23 given 07/31/13, next due at age 32 Tdap vaccine: last done 09/01/15 Shingles vaccine: N/A   Advanced directives: declined  Next appointment: None   Preventive Care 40-64 Years, Male Preventive care refers to lifestyle choices and visits with your health care provider that can promote health and wellness. What does preventive care include?  A yearly physical exam. This is also called an annual well check.  Dental exams once or twice a year.  Routine eye exams. Ask your health care provider how often you should have your eyes checked.  Personal lifestyle choices, including:  Daily care of your teeth and gums.  Regular physical activity.  Eating a healthy diet.  Avoiding tobacco and drug use.  Limiting alcohol use.  Practicing safe sex.  Taking low-dose aspirin every day starting at age 60. What happens during an annual well check? The services and screenings done by your health care provider during your annual well check will depend on your age, overall health, lifestyle risk factors, and family history of disease. Counseling  Your health care provider may ask you questions about your:  Alcohol use.  Tobacco use.  Drug use.  Emotional well-being.  Home and relationship well-being.  Sexual activity.  Eating habits.  Work and work Statistician. Screening  You may have the following tests or measurements:  Height, weight, and BMI.  Blood  pressure.  Lipid and cholesterol levels. These may be checked every 5 years, or more frequently if you are over 16 years old.  Skin check.  Lung cancer screening. You may have this screening every year starting at age 83 if you have a 30-pack-year history of smoking and currently smoke or have quit within the past 15 years.  Fecal occult blood test (FOBT) of the stool. You may have this test every year starting at age 24.  Flexible sigmoidoscopy or colonoscopy. You may have a sigmoidoscopy every 5 years or a colonoscopy every 10 years starting at age 61.  Prostate cancer screening. Recommendations will vary depending on your family history and other risks.  Hepatitis C blood test.  Hepatitis B blood test.  Sexually transmitted disease (STD) testing.  Diabetes screening. This is done by checking your blood sugar (glucose) after you have not eaten for a while (fasting). You may have this done every 1-3 years. Discuss your test results, treatment options, and if necessary, the need for more tests with your health care provider. Vaccines  Your health care provider may recommend certain vaccines, such as:  Influenza vaccine. This is recommended every year.  Tetanus, diphtheria, and acellular pertussis (Tdap, Td) vaccine. You may need a Td booster every 10 years.  Zoster vaccine. You may need this after age 27.  Pneumococcal 13-valent conjugate (PCV13) vaccine. You may need this if you have certain conditions and have not been vaccinated.  Pneumococcal polysaccharide (PPSV23) vaccine. You may need one or two doses if you smoke cigarettes or if you have certain conditions. Talk to your health care provider about which screenings and vaccines you need and how often you need  them. This information is not intended to replace advice given to you by your health care provider. Make sure you discuss any questions you have with your health care provider. Document Released: 10/14/2015 Document  Revised: 06/06/2016 Document Reviewed: 07/19/2015 Elsevier Interactive Patient Education  2017 [place] Prevention in the Home Falls can cause injuries. They can happen to people of all ages. There are many things you can do to make your home safe and to help prevent falls. What can I do on the outside of my home?  Regularly fix the edges of walkways and driveways and fix any cracks.  Remove anything that might make you trip as you walk through a door, such as a raised step or threshold.  Trim any bushes or trees on the path to your home.  Use bright outdoor lighting.  Clear any walking paths of anything that might make someone trip, such as rocks or tools.  Regularly check to see if handrails are loose or broken. Make sure that both sides of any steps have handrails.  Any raised decks and porches should have guardrails on the edges.  Have any leaves, snow, or ice cleared regularly.  Use sand or salt on walking paths during winter.  Clean up any spills in your garage right away. This includes oil or grease spills. What can I do in the bathroom?  Use night lights.  Install grab bars by the toilet and in the tub and shower. Do not use towel bars as grab bars.  Use non-skid mats or decals in the tub or shower.  If you need to sit down in the shower, use a plastic, non-slip stool.  Keep the floor dry. Clean up any water that spills on the floor as soon as it happens.  Remove soap buildup in the tub or shower regularly.  Attach bath mats securely with double-sided non-slip rug tape.  Do not have throw rugs and other things on the floor that can make you trip. What can I do in the bedroom?  Use night lights.  Make sure that you have a light by your bed that is easy to reach.  Do not use any sheets or blankets that are too big for your bed. They should not hang down onto the floor.  Have a firm chair that has side arms. You can use this for support while you  get dressed.  Do not have throw rugs and other things on the floor that can make you trip. What can I do in the kitchen?  Clean up any spills right away.  Avoid walking on wet floors.  Keep items that you use a lot in easy-to-reach places.  If you need to reach something above you, use a strong step stool that has a grab bar.  Keep electrical cords out of the way.  Do not use floor polish or wax that makes floors slippery. If you must use wax, use non-skid floor wax.  Do not have throw rugs and other things on the floor that can make you trip. What can I do with my stairs?  Do not leave any items on the stairs.  Make sure that there are handrails on both sides of the stairs and use them. Fix handrails that are broken or loose. Make sure that handrails are as long as the stairways.  Check any carpeting to make sure that it is firmly attached to the stairs. Fix any carpet that is loose or worn.  Avoid  having throw rugs at the top or bottom of the stairs. If you do have throw rugs, attach them to the floor with carpet tape.  Make sure that you have a light switch at the top of the stairs and the bottom of the stairs. If you do not have them, ask someone to add them for you. What else can I do to help prevent falls?  Wear shoes that:  Do not have high heels.  Have rubber bottoms.  Are comfortable and fit you well.  Are closed at the toe. Do not wear sandals.  If you use a stepladder:  Make sure that it is fully opened. Do not climb a closed stepladder.  Make sure that both sides of the stepladder are locked into place.  Ask someone to hold it for you, if possible.  Clearly mark and make sure that you can see:  Any grab bars or handrails.  First and last steps.  Where the edge of each step is.  Use tools that help you move around (mobility aids) if they are needed. These include:  Canes.  Walkers.  Scooters.  Crutches.  Turn on the lights when you go into  a dark area. Replace any light bulbs as soon as they burn out.  Set up your furniture so you have a clear path. Avoid moving your furniture around.  If any of your floors are uneven, fix them.  If there are any pets around you, be aware of where they are.  Review your medicines with your doctor. Some medicines can make you feel dizzy. This can increase your chance of falling. Ask your doctor what other things that you can do to help prevent falls. This information is not intended to replace advice given to you by your health care provider. Make sure you discuss any questions you have with your health care provider. Document Released: 07/14/2009 Document Revised: 02/23/2016 Document Reviewed: 10/22/2014 Elsevier Interactive Patient Education  2017 [employer].

## 2017-01-21 NOTE — Progress Notes (Signed)
Name: John Garrett   MRN: [id number]    DOB: 1961-05-25   Date:01/21/2017       Progress Note  Subjective  Chief Complaint  No chief complaint on file.   HPI  Pt. Presents for part 2 of [employer]. He is doing well.   Past Medical History:  Diagnosis Date  . Anxiety   . Chronic back pain   . Chronic pain   . Depression   . Hyperlipidemia     Past Surgical History:  Procedure Laterality Date  . CATARACT EXTRACTION Right 2014    Family History  Problem Relation Age of Onset  . Adopted: Yes  . Family history unknown: Yes    Social History   Social History  . Marital status: Single    Spouse name: N/A  . Number of children: N/A  . Years of education: N/A   Occupational History  . Not on file.   Social History Main Topics  . Smoking status: Current Every Day Smoker    Packs/day: 0.00    Types: E-cigarettes  . Smokeless tobacco: Former Systems developer    Types: Chew     Comment: decreasing nicotene in [employer]  . Alcohol use 7.2 oz/week    12 Cans of beer per week  . Drug use: No  . Sexual activity: Not on file   Other Topics Concern  . Not on file   Social History Narrative  . No narrative on file     Current Outpatient Prescriptions:  .  ALPRAZolam (XANAX) 0.25 MG tablet, Take 1 tablet (0.25 mg total) by mouth daily as needed., Disp: 30 tablet, Rfl: 0 .  buPROPion (WELLBUTRIN XL) 300 MG 24 hr tablet, Take 1 tablet (300 mg total) by mouth daily., Disp: 90 tablet, Rfl: 0 .  busPIRone (BUSPAR) 15 MG tablet, Take 1 tablet by mouth 2 (two) times daily., Disp: , Rfl:  .  citalopram (CELEXA) 40 MG tablet, Take 1 tablet (40 mg total) by mouth daily., Disp: 90 tablet, Rfl: 0 .  ibuprofen (ADVIL,MOTRIN) 800 MG tablet, Take 1 tablet (800 mg total) by mouth daily as needed. (Patient taking differently: Take 800 mg by mouth daily as needed. ), Disp: 90 tablet, Rfl: 1 .  orphenadrine (NORFLEX) 100 MG tablet, take 1 tablet by mouth twice a day if needed for muscle  spasm, Disp: , Rfl: 0 .  oxyCODONE-acetaminophen (PERCOCET/ROXICET) 5-325 MG tablet, Take 1 tablet by mouth every 4 (four) hours as needed for severe pain. PRN, Disp: 180 tablet, Rfl: 0 .  rosuvastatin (CRESTOR) 20 MG tablet, 20 mg daily. , Disp: , Rfl:   No Known Allergies   Review of Systems  Constitutional: Negative for chills, fever and malaise/fatigue.  HENT: Positive for sinus pain (sinus congestion from pollen). Negative for congestion and ear pain.   Eyes: Negative for blurred vision and double vision.  Respiratory: Negative for cough, sputum production and shortness of breath.   Cardiovascular: Negative for chest pain, palpitations and leg swelling.  Gastrointestinal: Negative for abdominal pain, blood in stool, constipation, diarrhea, nausea and vomiting.       Has intermittent stool incontinence, now getting worse  Genitourinary: Negative for dysuria, frequency, hematuria and urgency.  Musculoskeletal: Positive for back pain and neck pain. Negative for joint pain.  Neurological: Negative for dizziness and headaches.  Psychiatric/Behavioral: Positive for depression. The patient is nervous/anxious.     Objective  There were no vitals filed for this visit.  Physical Exam  Constitutional: He  is oriented to person, place, and time and well-developed, well-nourished, and in no distress.  HENT:  Head: Normocephalic and atraumatic.  Right Ear: External ear normal.  Left Ear: External ear normal.  Eyes: Conjunctivae are normal. Pupils are equal, round, and reactive to light.  Neck: No thyromegaly present.  Cardiovascular: Normal rate, regular rhythm and normal heart sounds.   No murmur heard. Pulmonary/Chest: Effort normal and breath sounds normal. He has no wheezes.  Abdominal: Soft. Bowel sounds are normal. There is no tenderness.  Genitourinary: Prostate is enlarged.  Genitourinary Comments: Prostate marginally enlarged, non tender  Musculoskeletal:       Lumbar back: He  exhibits tenderness, pain and spasm.       Back:  Neurological: He is alert and oriented to person, place, and time.  Skin: Skin is warm, dry and intact.  Psychiatric: Mood, memory, affect and judgment normal.  Nursing note and vitals reviewed.    Assessment & Plan  1. Medicare annual wellness visit, subsequent Obtain age-appropriate laboratory screenings, EKG is normal - CBC with Differential/Platelet - COMPLETE METABOLIC PANEL WITH GFR - Lipid panel - VITAMIN D 25 Hydroxy (Vit-D Deficiency, Fractures) - TSH - EKG 12-Lead  2. Screening for colon cancer  - Cologuard  3. Screening for prostate cancer Prostate marginally enlarged, obtain PSA levels to correlate - PSA  4. Lumbar radiculopathy, chronic Chronic low back pain, responsive to opioid therapy, patient compliant with controlled substance agreement, refills provided - oxyCODONE-acetaminophen (PERCOCET/ROXICET) 5-325 MG tablet; Take 1 tablet by mouth every 4 (four) hours as needed for severe pain. PRN  Dispense: 180 tablet; Refill: 0  Froilan Mclean Asad [facility] 01/21/2017 11:50 AM

## 2017-01-22 LAB — COLOGUARD: Cologuard: NEGATIVE

## 2017-01-29 DIAGNOSIS — Z1211 Encounter for screening for malignant neoplasm of colon: Secondary | ICD-10-CM | POA: Diagnosis not present

## 2017-01-29 DIAGNOSIS — Z1212 Encounter for screening for malignant neoplasm of rectum: Secondary | ICD-10-CM | POA: Diagnosis not present

## 2017-02-05 LAB — COLOGUARD: Cologuard: NEGATIVE

## 2017-02-19 ENCOUNTER — Ambulatory Visit (INDEPENDENT_AMBULATORY_CARE_PROVIDER_SITE_OTHER): Payer: Medicare PPO | Admitting: Family Medicine

## 2017-02-19 ENCOUNTER — Encounter: Payer: Self-pay | Admitting: Family Medicine

## 2017-02-19 VITALS — BP 140/77 | HR 79 | Temp 98.1°F | Resp 16 | Ht 68.0 in | Wt 263.8 lb

## 2017-02-19 DIAGNOSIS — G4762 Sleep related leg cramps: Secondary | ICD-10-CM

## 2017-02-19 DIAGNOSIS — E782 Mixed hyperlipidemia: Secondary | ICD-10-CM | POA: Diagnosis not present

## 2017-02-19 DIAGNOSIS — F419 Anxiety disorder, unspecified: Secondary | ICD-10-CM | POA: Diagnosis not present

## 2017-02-19 DIAGNOSIS — M5416 Radiculopathy, lumbar region: Secondary | ICD-10-CM

## 2017-02-19 DIAGNOSIS — F329 Major depressive disorder, single episode, unspecified: Secondary | ICD-10-CM

## 2017-02-19 DIAGNOSIS — F341 Dysthymic disorder: Secondary | ICD-10-CM | POA: Diagnosis not present

## 2017-02-19 MED ORDER — BUPROPION HCL ER (XL) 300 MG PO TB24: 300.0000 mg | ORAL_TABLET | Freq: Every day | ORAL | 0 refills | Status: DC

## 2017-02-19 MED ORDER — ROSUVASTATIN CALCIUM 20 MG PO TABS: 20.0000 mg | ORAL_TABLET | Freq: Every day | ORAL | 0 refills | Status: DC

## 2017-02-19 MED ORDER — BUSPIRONE HCL 15 MG PO TABS: 15.0000 mg | ORAL_TABLET | Freq: Two times a day (BID) | ORAL | 0 refills | Status: DC

## 2017-02-19 MED ORDER — IBUPROFEN 800 MG PO TABS: 800.0000 mg | ORAL_TABLET | Freq: Every day | ORAL | 1 refills | Status: DC | PRN

## 2017-02-19 MED ORDER — OXYCODONE-ACETAMINOPHEN 5-325 MG PO TABS: 1.0000 | ORAL_TABLET | ORAL | 0 refills | Status: DC | PRN

## 2017-02-19 MED ORDER — TIZANIDINE HCL 2 MG PO TABS: 2.0000 mg | ORAL_TABLET | Freq: Every day | ORAL | 2 refills | Status: DC

## 2017-02-19 MED ORDER — ALPRAZOLAM 0.25 MG PO TABS: 0.2500 mg | ORAL_TABLET | Freq: Every day | ORAL | 0 refills | Status: DC | PRN

## 2017-02-19 MED ORDER — CITALOPRAM HYDROBROMIDE 40 MG PO TABS: 40.0000 mg | ORAL_TABLET | Freq: Every day | ORAL | 0 refills | Status: DC

## 2017-02-19 NOTE — Progress Notes (Signed)
Name: John Garrett   MRN: [id number]    DOB: 1961-03-10   Date:02/19/2017       Progress Note  Subjective  Chief Complaint  Chief Complaint  Patient presents with  . Follow-up    1 mo  . Medication Refill    Back Pain  This is a chronic problem. The problem occurs constantly. The problem is unchanged. The pain is present in the lumbar spine. The pain radiates to the left thigh and right thigh. The pain is at a severity of 5/10. The symptoms are aggravated by sitting and standing (worse with prolonged sitting and standing). Associated symptoms include numbness and tingling. Pertinent negatives include no bladder incontinence, bowel incontinence, headaches or leg pain (leg cramps at night). He has tried analgesics and NSAIDs for the symptoms. The treatment provided moderate relief.  Anxiety  Presents for follow-up visit. Symptoms include depressed mood, excessive worry, irritability and nervous/anxious behavior. Patient reports no shortness of breath or suicidal ideas. The severity of symptoms is moderate.    Depression         This is a chronic problem.  The onset quality is gradual. The problem is unchanged.  Associated symptoms include fatigue and sad.  Associated symptoms include no helplessness, no hopelessness, no decreased interest, no appetite change, no myalgias, no headaches and no suicidal ideas.  Past treatments include SSRIs - Selective serotonin reuptake inhibitors.  Compliance with treatment is good.  Past medical history includes anxiety.   Hyperlipidemia  This is a chronic problem. The problem is uncontrolled. Recent lipid tests were reviewed and are high. Exacerbating diseases include obesity. Pertinent negatives include no leg pain (leg cramps at night), myalgias or shortness of breath. Current antihyperlipidemic treatment includes statins.    Past Medical History:  Diagnosis Date  . Anxiety   . Chronic back pain   . Chronic pain   . Depression   . Hyperlipidemia      Past Surgical History:  Procedure Laterality Date  . CATARACT EXTRACTION Right 2014    Family History  Problem Relation Age of Onset  . Adopted: Yes  . Family history unknown: Yes    Social History   Social History  . Marital status: Single    Spouse name: N/A  . Number of children: N/A  . Years of education: N/A   Occupational History  . Not on file.   Social History Main Topics  . Smoking status: Current Every Day Smoker    Packs/day: 0.00    Types: E-cigarettes  . Smokeless tobacco: Former Systems developer    Types: Chew     Comment: decreasing nicotene in [place]  . Alcohol use 7.2 oz/week    12 Cans of beer per week  . Drug use: No  . Sexual activity: Not on file   Other Topics Concern  . Not on file   Social History Narrative  . No narrative on file     Current Outpatient Prescriptions:  .  ALPRAZolam (XANAX) 0.25 MG tablet, Take 1 tablet (0.25 mg total) by mouth daily as needed., Disp: 30 tablet, Rfl: 0 .  buPROPion (WELLBUTRIN XL) 300 MG 24 hr tablet, Take 1 tablet (300 mg total) by mouth daily., Disp: 90 tablet, Rfl: 0 .  busPIRone (BUSPAR) 15 MG tablet, Take 1 tablet by mouth 2 (two) times daily., Disp: , Rfl:  .  citalopram (CELEXA) 40 MG tablet, Take 1 tablet (40 mg total) by mouth daily., Disp: 90 tablet, Rfl: 0 .  ibuprofen (ADVIL,MOTRIN)  800 MG tablet, Take 1 tablet (800 mg total) by mouth daily as needed. (Patient taking differently: Take 800 mg by mouth daily as needed. ), Disp: 90 tablet, Rfl: 1 .  orphenadrine (NORFLEX) 100 MG tablet, take 1 tablet by mouth twice a day if needed for muscle spasm, Disp: , Rfl: 0 .  oxyCODONE-acetaminophen (PERCOCET/ROXICET) 5-325 MG tablet, Take 1 tablet by mouth every 4 (four) hours as needed for severe pain. PRN, Disp: 180 tablet, Rfl: 0 .  rosuvastatin (CRESTOR) 20 MG tablet, 20 mg daily. , Disp: , Rfl:   No Known Allergies   Review of Systems  Constitutional: Positive for fatigue and irritability. Negative for  appetite change.  Respiratory: Negative for shortness of breath.   Gastrointestinal: Negative for bowel incontinence.  Genitourinary: Negative for bladder incontinence.  Musculoskeletal: Positive for back pain. Negative for myalgias.  Neurological: Positive for tingling and numbness. Negative for headaches.  Psychiatric/Behavioral: Positive for depression. Negative for suicidal ideas. The patient is nervous/anxious.     Objective  Vitals:   02/19/17 1013  BP: 140/77  Pulse: 79  Resp: 16  Temp: 98.1 F (36.7 C)  TempSrc: Oral  SpO2: 97%  Weight: 263 lb 12.8 oz (119.7 kg)  Height: 5\' 8"  (1.727 m)    Physical Exam  Constitutional: He is oriented to person, place, and time and well-developed, well-nourished, and in no distress.  HENT:  Head: Normocephalic and atraumatic.  Cardiovascular: Normal rate, regular rhythm and normal heart sounds.   No murmur heard. Pulmonary/Chest: Effort normal and breath sounds normal. He has no wheezes.  Musculoskeletal:       Lumbar back: He exhibits tenderness, pain and spasm.       Back:  Neurological: He is alert and oriented to person, place, and time.  Psychiatric: Mood, memory, affect and judgment normal.  Nursing note and vitals reviewed.   Assessment & Plan  1. Anxiety Stable and responsive to alprazolam and buspirone taken as directed, patient compliant with controlled substances agreement. Refills provided - ALPRAZolam (XANAX) 0.25 MG tablet; Take 1 tablet (0.25 mg total) by mouth daily as needed.  Dispense: 30 tablet; Refill: 0 - busPIRone (BUSPAR) 15 MG tablet; Take 1 tablet (15 mg total) by mouth 2 (two) times daily.  Dispense: 180 tablet; Refill: 0  2. Lumbar radiculopathy, chronic Chronic low back pain responsive to opioids, patient compliant with controlled substances agreement, [place] was checked prior to prescription for opioids,  - oxyCODONE-acetaminophen (PERCOCET/ROXICET) 5-325 MG tablet; Take 1 tablet by mouth every 4  (four) hours as needed for severe pain. PRN  Dispense: 180 tablet; Refill: 0 - ibuprofen (ADVIL,MOTRIN) 800 MG tablet; Take 1 tablet (800 mg total) by mouth daily as needed.  Dispense: 90 tablet; Refill: 1  3. Major depression, chronic  - citalopram (CELEXA) 40 MG tablet; Take 1 tablet (40 mg total) by mouth daily.  Dispense: 90 tablet; Refill: 0 - buPROPion (WELLBUTRIN XL) 300 MG 24 hr tablet; Take 1 tablet (300 mg total) by mouth daily.  Dispense: 90 tablet; Refill: 0  4. Mixed hyperlipidemia  - rosuvastatin (CRESTOR) 20 MG tablet; Take 1 tablet (20 mg total) by mouth daily.  Dispense: 90 tablet; Refill: 0  5. Nocturnal leg cramps Recommend muscle relaxant at night for treatment, follow-up in one month - tiZANidine (ZANAFLEX) 2 MG tablet; Take 1 tablet (2 mg total) by mouth at bedtime.  Dispense: 30 tablet; Refill: 2   Shiro Ellerman Asad A. [facility] 02/19/2017  10:45 AM

## 2017-03-20 ENCOUNTER — Ambulatory Visit (INDEPENDENT_AMBULATORY_CARE_PROVIDER_SITE_OTHER): Payer: Medicare PPO | Admitting: Family Medicine

## 2017-03-20 ENCOUNTER — Encounter: Payer: Self-pay | Admitting: Family Medicine

## 2017-03-20 VITALS — BP 141/71 | HR 80 | Temp 98.4°F | Resp 17 | Ht 68.0 in | Wt 260.4 lb

## 2017-03-20 DIAGNOSIS — R030 Elevated blood-pressure reading, without diagnosis of hypertension: Secondary | ICD-10-CM | POA: Diagnosis not present

## 2017-03-20 DIAGNOSIS — M5416 Radiculopathy, lumbar region: Secondary | ICD-10-CM

## 2017-03-20 MED ORDER — OXYCODONE-ACETAMINOPHEN 5-325 MG PO TABS: 1.0000 | ORAL_TABLET | ORAL | 0 refills | Status: DC | PRN

## 2017-03-20 NOTE — Progress Notes (Signed)
Name: John Garrett   MRN: [id number]    DOB: 1961-08-14   Date:03/20/2017       Progress Note  Subjective  Chief Complaint  Chief Complaint  Patient presents with  . Follow-up    1 mo  . Medication Refill    Back Pain  This is a chronic problem. The problem is unchanged. The pain is present in the gluteal and lumbar spine. The quality of the pain is described as shooting. The pain radiates to the right thigh and right knee. The pain is at a severity of 6/10. The pain is moderate. The symptoms are aggravated by position and sitting (prolonged sitting makes it worse, standing for some time hurts his back). Pertinent negatives include no bladder incontinence, bowel incontinence or fever. He has tried analgesics for the symptoms. The treatment provided significant relief.      Past Medical History:  Diagnosis Date  . Anxiety   . Chronic back pain   . Chronic pain   . Depression   . Hyperlipidemia     Past Surgical History:  Procedure Laterality Date  . CATARACT EXTRACTION Right 2014    Family History  Problem Relation Age of Onset  . Adopted: Yes  . Family history unknown: Yes    Social History   Social History  . Marital status: Single    Spouse name: N/A  . Number of children: N/A  . Years of education: N/A   Occupational History  . Not on file.   Social History Main Topics  . Smoking status: Current Every Day Smoker    Packs/day: 0.00    Types: E-cigarettes  . Smokeless tobacco: Former Systems developer    Types: Chew     Comment: decreasing nicotene in [place]  . Alcohol use 7.2 oz/week    12 Cans of beer per week  . Drug use: No  . Sexual activity: Not on file   Other Topics Concern  . Not on file   Social History Narrative  . No narrative on file     Current Outpatient Prescriptions:  .  ALPRAZolam (XANAX) 0.25 MG tablet, Take 1 tablet (0.25 mg total) by mouth daily as needed., Disp: 30 tablet, Rfl: 0 .  buPROPion (WELLBUTRIN XL) 300 MG 24 hr tablet, Take 1  tablet (300 mg total) by mouth daily., Disp: 90 tablet, Rfl: 0 .  busPIRone (BUSPAR) 15 MG tablet, Take 1 tablet (15 mg total) by mouth 2 (two) times daily., Disp: 180 tablet, Rfl: 0 .  citalopram (CELEXA) 40 MG tablet, Take 1 tablet (40 mg total) by mouth daily., Disp: 90 tablet, Rfl: 0 .  ibuprofen (ADVIL,MOTRIN) 800 MG tablet, Take 1 tablet (800 mg total) by mouth daily as needed., Disp: 90 tablet, Rfl: 1 .  orphenadrine (NORFLEX) 100 MG tablet, take 1 tablet by mouth twice a day if needed for muscle spasm, Disp: , Rfl: 0 .  oxyCODONE-acetaminophen (PERCOCET/ROXICET) 5-325 MG tablet, Take 1 tablet by mouth every 4 (four) hours as needed for severe pain. PRN, Disp: 180 tablet, Rfl: 0 .  rosuvastatin (CRESTOR) 20 MG tablet, Take 1 tablet (20 mg total) by mouth daily., Disp: 90 tablet, Rfl: 0 .  tiZANidine (ZANAFLEX) 2 MG tablet, Take 1 tablet (2 mg total) by mouth at bedtime., Disp: 30 tablet, Rfl: 2  No Known Allergies   Review of Systems  Constitutional: Negative for fever.  Gastrointestinal: Negative for bowel incontinence.  Genitourinary: Negative for bladder incontinence.  Musculoskeletal: Positive for back pain.  Objective  Vitals:   03/20/17 0940  BP: (!) 141/71  Pulse: 80  Resp: 17  Temp: 98.4 F (36.9 C)  TempSrc: Oral  SpO2: 98%  Weight: 260 lb 6.4 oz (118.1 kg)  Height: 5\' 8"  (1.727 m)    Physical Exam  Constitutional: He is oriented to person, place, and time and well-developed, well-nourished, and in no distress.  HENT:  Head: Normocephalic and atraumatic.  Cardiovascular: Normal rate, regular rhythm and normal heart sounds.   No murmur heard. Pulmonary/Chest: Effort normal and breath sounds normal. He has no wheezes.  Musculoskeletal:       Lumbar back: He exhibits tenderness, pain and spasm.       Back:  Neurological: He is alert and oriented to person, place, and time.  Psychiatric: Mood, memory, affect and judgment normal.  Nursing note and  vitals reviewed.      Assessment & Plan  1. Lumbar radiculopathy, chronic Stable and responsive to opioid therapy, patient compliant with controlled substances agreement. Refills provided - oxyCODONE-acetaminophen (PERCOCET/ROXICET) 5-325 MG tablet; Take 1 tablet by mouth every 4 (four) hours as needed for severe pain. PRN  Dispense: 180 tablet; Refill: 0  2. Elevated BP without diagnosis of hypertension Likely because of pain versus anxiety, reassess in one month  Kam Kushnir Asad A. [facility] 03/20/2017 10:01 AM

## 2017-03-22 ENCOUNTER — Ambulatory Visit: Payer: Medicare PPO | Admitting: Family Medicine

## 2017-04-16 ENCOUNTER — Ambulatory Visit (INDEPENDENT_AMBULATORY_CARE_PROVIDER_SITE_OTHER): Payer: Medicare PPO | Admitting: Family Medicine

## 2017-04-16 ENCOUNTER — Encounter: Payer: Self-pay | Admitting: Family Medicine

## 2017-04-16 VITALS — BP 110/64 | HR 92 | Temp 98.4°F | Resp 17 | Ht 68.0 in | Wt 261.3 lb

## 2017-04-16 DIAGNOSIS — R030 Elevated blood-pressure reading, without diagnosis of hypertension: Secondary | ICD-10-CM

## 2017-04-16 DIAGNOSIS — Z789 Other specified health status: Secondary | ICD-10-CM | POA: Diagnosis not present

## 2017-04-16 DIAGNOSIS — M5416 Radiculopathy, lumbar region: Secondary | ICD-10-CM | POA: Diagnosis not present

## 2017-04-16 MED ORDER — OXYCODONE-ACETAMINOPHEN 5-325 MG PO TABS: 1.0000 | ORAL_TABLET | ORAL | 0 refills | Status: DC | PRN

## 2017-04-16 NOTE — Progress Notes (Signed)
Name: John Garrett   MRN: [id number]    DOB: November 10, 1960   Date:04/16/2017       Progress Note  Subjective  Chief Complaint  Chief Complaint  Patient presents with  . Follow-up    1 mo  . Medication Refill    oxycodone    Back Pain  This is a chronic problem. The problem is unchanged. The pain is present in the gluteal and lumbar spine. The quality of the pain is described as shooting. The pain radiates to the right thigh and right knee. The pain is at a severity of 6/10. The pain is moderate. The symptoms are aggravated by position and sitting (prolonged sitting makes it worse, standing for some time hurts his back). Pertinent negatives include no bladder incontinence, bowel incontinence or fever. He has tried analgesics for the symptoms. The treatment provided significant relief.  Nicotine Dependence  Presents for initial visit. Symptoms include cravings. Preferred tobacco type: electronic cigarette. His urge triggers include company of smokers, drinking coffee, meal time and stress. The symptoms have been stable. His first smoke is before 6 AM. John Garrett is thinking about quitting.     Past Medical History:  Diagnosis Date  . Anxiety   . Chronic back pain   . Chronic pain   . Depression   . Hyperlipidemia     Past Surgical History:  Procedure Laterality Date  . CATARACT EXTRACTION Right 2014    Family History  Problem Relation Age of Onset  . Adopted: Yes  . Family history unknown: Yes    Social History   Social History  . Marital status: Single    Spouse name: N/A  . Number of children: N/A  . Years of education: N/A   Occupational History  . Not on file.   Social History Main Topics  . Smoking status: Current Every Day Smoker    Packs/day: 0.00    Types: E-cigarettes  . Smokeless tobacco: Former Systems developer    Types: Chew     Comment: decreasing nicotene in [place]  . Alcohol use 7.2 oz/week    12 Cans of beer per week  . Drug use: No  . Sexual activity: Not on  file   Other Topics Concern  . Not on file   Social History Narrative  . No narrative on file     Current Outpatient Prescriptions:  .  ALPRAZolam (XANAX) 0.25 MG tablet, Take 1 tablet (0.25 mg total) by mouth daily as needed., Disp: 30 tablet, Rfl: 0 .  buPROPion (WELLBUTRIN XL) 300 MG 24 hr tablet, Take 1 tablet (300 mg total) by mouth daily., Disp: 90 tablet, Rfl: 0 .  busPIRone (BUSPAR) 15 MG tablet, Take 1 tablet (15 mg total) by mouth 2 (two) times daily., Disp: 180 tablet, Rfl: 0 .  citalopram (CELEXA) 40 MG tablet, Take 1 tablet (40 mg total) by mouth daily., Disp: 90 tablet, Rfl: 0 .  ibuprofen (ADVIL,MOTRIN) 800 MG tablet, Take 1 tablet (800 mg total) by mouth daily as needed., Disp: 90 tablet, Rfl: 1 .  orphenadrine (NORFLEX) 100 MG tablet, take 1 tablet by mouth twice a day if needed for muscle spasm, Disp: , Rfl: 0 .  oxyCODONE-acetaminophen (PERCOCET/ROXICET) 5-325 MG tablet, Take 1 tablet by mouth every 4 (four) hours as needed for severe pain. PRN, Disp: 180 tablet, Rfl: 0 .  rosuvastatin (CRESTOR) 20 MG tablet, Take 1 tablet (20 mg total) by mouth daily., Disp: 90 tablet, Rfl: 0 .  tiZANidine (ZANAFLEX) 2 MG  tablet, Take 1 tablet (2 mg total) by mouth at bedtime., Disp: 30 tablet, Rfl: 2  No Known Allergies   Review of Systems  Constitutional: Negative for fever.  Gastrointestinal: Negative for bowel incontinence.  Genitourinary: Negative for bladder incontinence.  Musculoskeletal: Positive for back pain.      Objective  Vitals:   04/16/17 1327  BP: 110/64  Pulse: 92  Resp: 17  Temp: 98.4 F (36.9 C)  TempSrc: Oral  SpO2: 96%  Weight: 261 lb 4.8 oz (118.5 kg)  Height: 5\' 8"  (1.727 m)    Physical Exam  Constitutional: He is oriented to person, place, and time and well-developed, well-nourished, and in no distress.  HENT:  Head: Normocephalic and atraumatic.  Cardiovascular: Normal rate, regular rhythm and normal heart sounds.   No murmur  heard. Pulmonary/Chest: Effort normal and breath sounds normal. He has no wheezes.  Musculoskeletal:       Lumbar back: He exhibits tenderness, pain and spasm.       Back:  Neurological: He is alert and oriented to person, place, and time.  Psychiatric: Mood, memory, affect and judgment normal.  Nursing note and vitals reviewed.      Assessment & Plan  1. Lumbar radiculopathy, chronic Stable and responsive to opioid therapy, patient compliant with controlled substances agreement, refills provided - oxyCODONE-acetaminophen (PERCOCET/ROXICET) 5-325 MG tablet; Take 1 tablet by mouth every 4 (four) hours as needed for severe pain. PRN  Dispense: 180 tablet; Refill: 0  2. Electronic cigarette use Patient is trying to decrease the nicotine content in his vaping device, encouraged to quit altogether  3. Elevated BP without diagnosis of hypertension BP is stable and at goal, on no antihypertensive treatment  John Garrett A. [facility] 04/16/2017 8:17 PM

## 2017-05-20 ENCOUNTER — Encounter: Payer: Self-pay | Admitting: Family Medicine

## 2017-05-20 ENCOUNTER — Ambulatory Visit (INDEPENDENT_AMBULATORY_CARE_PROVIDER_SITE_OTHER): Payer: Medicare PPO | Admitting: Family Medicine

## 2017-05-20 DIAGNOSIS — M5416 Radiculopathy, lumbar region: Secondary | ICD-10-CM | POA: Diagnosis not present

## 2017-05-20 MED ORDER — OXYCODONE-ACETAMINOPHEN 5-325 MG PO TABS: 1.0000 | ORAL_TABLET | ORAL | 0 refills | Status: DC | PRN

## 2017-05-20 NOTE — Progress Notes (Signed)
Name: John Garrett   MRN: [id number]    DOB: [birth year]   Date:05/20/2017       Progress Note  Subjective  Chief Complaint  Chief Complaint  Patient presents with  . Follow-up    patient is here for his 1 month f/u  . Medication Refill    oxycodone- acetaminophen 5/325    Back Pain  This is a chronic problem. The problem is unchanged. The pain is present in the gluteal and lumbar spine. The quality of the pain is described as shooting. The pain radiates to the right thigh and right knee. The pain is at a severity of 6/10. The pain is moderate. The symptoms are aggravated by position and sitting (prolonged sitting makes it worse, standing for some time hurts his back). Associated symptoms include leg pain (right leg pain). Pertinent negatives include no bladder incontinence, bowel incontinence or numbness. He has tried analgesics for the symptoms. The treatment provided significant relief.     Past Medical History:  Diagnosis Date  . Anxiety   . Chronic back pain   . Chronic pain   . Depression   . Hyperlipidemia     Past Surgical History:  Procedure Laterality Date  . CATARACT EXTRACTION Right 2014    Family History  Problem Relation Age of Onset  . Adopted: Yes  . Family history unknown: Yes    Social History   Social History  . Marital status: Single    Spouse name: N/A  . Number of children: N/A  . Years of education: N/A   Occupational History  . Not on file.   Social History Main Topics  . Smoking status: Current Every Day Smoker    Packs/day: 0.00    Types: E-cigarettes  . Smokeless tobacco: Former Systems developer    Types: Chew     Comment: decreasing nicotene in [place]  . Alcohol use 7.2 oz/week    12 Cans of beer per week  . Drug use: No  . Sexual activity: No   Other Topics Concern  . Not on file   Social History Narrative  . No narrative on file     Current Outpatient Prescriptions:  .  ALPRAZolam (XANAX) 0.25 MG tablet, Take 1 tablet (0.25 mg  total) by mouth daily as needed., Disp: 30 tablet, Rfl: 0 .  buPROPion (WELLBUTRIN XL) 300 MG 24 hr tablet, Take 1 tablet (300 mg total) by mouth daily., Disp: 90 tablet, Rfl: 0 .  busPIRone (BUSPAR) 15 MG tablet, Take 1 tablet (15 mg total) by mouth 2 (two) times daily., Disp: 180 tablet, Rfl: 0 .  citalopram (CELEXA) 40 MG tablet, Take 1 tablet (40 mg total) by mouth daily., Disp: 90 tablet, Rfl: 0 .  ibuprofen (ADVIL,MOTRIN) 800 MG tablet, Take 1 tablet (800 mg total) by mouth daily as needed., Disp: 90 tablet, Rfl: 1 .  orphenadrine (NORFLEX) 100 MG tablet, take 1 tablet by mouth twice a day if needed for muscle spasm, Disp: , Rfl: 0 .  oxyCODONE-acetaminophen (PERCOCET/ROXICET) 5-325 MG tablet, Take 1 tablet by mouth every 4 (four) hours as needed for severe pain. PRN, Disp: 180 tablet, Rfl: 0 .  rosuvastatin (CRESTOR) 20 MG tablet, Take 1 tablet (20 mg total) by mouth daily., Disp: 90 tablet, Rfl: 0 .  tiZANidine (ZANAFLEX) 2 MG tablet, Take 1 tablet (2 mg total) by mouth at bedtime., Disp: 30 tablet, Rfl: 2  No Known Allergies   Review of Systems  Gastrointestinal: Negative for bowel incontinence.  Genitourinary: Negative for bladder incontinence.  Musculoskeletal: Positive for back pain.  Neurological: Negative for numbness.     Objective  Vitals:   05/20/17 1037  BP: 112/68  Pulse: 70  Resp: 18  Temp: 98.4 F (36.9 C)  TempSrc: Oral  SpO2: 99%  Weight: 260 lb 14.4 oz (118.3 kg)  Height: 5\' 8"  (1.727 m)    Physical Exam  Constitutional: He is oriented to person, place, and time and well-developed, well-nourished, and in no distress.  HENT:  Head: Normocephalic and atraumatic.  Cardiovascular: Normal rate, regular rhythm and normal heart sounds.   No murmur heard. Pulmonary/Chest: Effort normal and breath sounds normal. He has no wheezes.  Musculoskeletal:       Lumbar back: He exhibits tenderness, pain and spasm.       Back:  Neurological: He is alert and  oriented to person, place, and time.  Psychiatric: Mood, memory, affect and judgment normal.  Nursing note and vitals reviewed.      Assessment & Plan  1. Lumbar radiculopathy, chronic Stable and responsive to opioid therapy, patient compliant with controlled substances agreement, refills provided - oxyCODONE-acetaminophen (PERCOCET/ROXICET) 5-325 MG tablet; Take 1 tablet by mouth every 4 (four) hours as needed for severe pain. PRN  Dispense: 180 tablet; Refill: 0   John Garrett [facility] 05/20/2017 10:54 AM

## 2017-06-19 ENCOUNTER — Encounter: Payer: Self-pay | Admitting: Family Medicine

## 2017-06-19 ENCOUNTER — Ambulatory Visit (INDEPENDENT_AMBULATORY_CARE_PROVIDER_SITE_OTHER): Payer: Medicare PPO | Admitting: Family Medicine

## 2017-06-19 VITALS — BP 115/72 | HR 91 | Temp 98.6°F | Resp 16 | Ht 68.0 in | Wt 261.6 lb

## 2017-06-19 DIAGNOSIS — Z23 Encounter for immunization: Secondary | ICD-10-CM

## 2017-06-19 DIAGNOSIS — F329 Major depressive disorder, single episode, unspecified: Secondary | ICD-10-CM

## 2017-06-19 DIAGNOSIS — F341 Dysthymic disorder: Secondary | ICD-10-CM

## 2017-06-19 DIAGNOSIS — G4762 Sleep related leg cramps: Secondary | ICD-10-CM | POA: Diagnosis not present

## 2017-06-19 DIAGNOSIS — E782 Mixed hyperlipidemia: Secondary | ICD-10-CM | POA: Diagnosis not present

## 2017-06-19 DIAGNOSIS — F419 Anxiety disorder, unspecified: Secondary | ICD-10-CM | POA: Diagnosis not present

## 2017-06-19 DIAGNOSIS — M5416 Radiculopathy, lumbar region: Secondary | ICD-10-CM | POA: Diagnosis not present

## 2017-06-19 MED ORDER — ALPRAZOLAM 0.25 MG PO TABS: 0.2500 mg | ORAL_TABLET | Freq: Every day | ORAL | 0 refills | Status: DC | PRN

## 2017-06-19 MED ORDER — ROSUVASTATIN CALCIUM 20 MG PO TABS: 20.0000 mg | ORAL_TABLET | Freq: Every day | ORAL | 0 refills | Status: DC

## 2017-06-19 MED ORDER — BUSPIRONE HCL 15 MG PO TABS: 15.0000 mg | ORAL_TABLET | Freq: Two times a day (BID) | ORAL | 0 refills | Status: DC

## 2017-06-19 MED ORDER — OXYCODONE-ACETAMINOPHEN 5-325 MG PO TABS: 1.0000 | ORAL_TABLET | ORAL | 0 refills | Status: DC | PRN

## 2017-06-19 MED ORDER — TIZANIDINE HCL 2 MG PO TABS: 2.0000 mg | ORAL_TABLET | Freq: Every day | ORAL | 2 refills | Status: DC

## 2017-06-19 MED ORDER — CITALOPRAM HYDROBROMIDE 40 MG PO TABS: 40.0000 mg | ORAL_TABLET | Freq: Every day | ORAL | 0 refills | Status: DC

## 2017-06-19 MED ORDER — BUPROPION HCL ER (XL) 300 MG PO TB24: 300.0000 mg | ORAL_TABLET | Freq: Every day | ORAL | 0 refills | Status: DC

## 2017-06-19 NOTE — Progress Notes (Signed)
Name: John Garrett   MRN: [id number]    DOB: 06/01/1961   Date:06/19/2017       Progress Note  Subjective  Chief Complaint  Chief Complaint  Patient presents with  . Follow-up    1 mo  . Medication Refill  . Hyperlipidemia    Back Pain  This is a chronic problem. The problem occurs constantly. The problem is unchanged. The pain is present in the lumbar spine. The pain radiates to the right thigh. The pain is at a severity of 7/10. The symptoms are aggravated by sitting and standing (worse with prolonged sitting and standing). Associated symptoms include numbness and tingling (occasional tinlging in his hands). Pertinent negatives include no bladder incontinence, bowel incontinence, headaches or leg pain (leg cramps at night). He has tried analgesics, NSAIDs and muscle relaxant for the symptoms. The treatment provided moderate relief.  Anxiety  Presents for follow-up visit. Symptoms include depressed mood, excessive worry, irritability, nervous/anxious behavior, panic and shortness of breath. Patient reports no insomnia or suicidal ideas. Primary symptoms comment: symptoms controlled on Buspirone. The severity of symptoms is moderate.    Depression       (Symptoms controlled on Buspirone)  This is a chronic problem.  The onset quality is gradual. The problem is unchanged.  Associated symptoms include fatigue.  Associated symptoms include no helplessness, no hopelessness, does not have insomnia, no decreased interest, no appetite change, no myalgias, no headaches, not sad and no suicidal ideas.  Past treatments include SSRIs - Selective serotonin reuptake inhibitors.  Compliance with treatment is good.  Past medical history includes anxiety.   Hyperlipidemia  This is a chronic problem. The problem is uncontrolled. Recent lipid tests were reviewed and are high. Exacerbating diseases include obesity. Associated symptoms include shortness of breath. Pertinent negatives include no leg pain (leg  cramps at night) or myalgias. Current antihyperlipidemic treatment includes statins.     Past Medical History:  Diagnosis Date  . Anxiety   . Chronic back pain   . Chronic pain   . Depression   . Hyperlipidemia     Past Surgical History:  Procedure Laterality Date  . CATARACT EXTRACTION Right 2014    Family History  Problem Relation Age of Onset  . Adopted: Yes  . Family history unknown: Yes    Social History   Social History  . Marital status: Single    Spouse name: N/A  . Number of children: N/A  . Years of education: N/A   Occupational History  . Not on file.   Social History Main Topics  . Smoking status: Current Every Day Smoker    Packs/day: 0.00    Types: E-cigarettes  . Smokeless tobacco: Current User    Types: Chew     Comment: decreasing nicotene in [place]  . Alcohol use 7.2 oz/week    12 Cans of beer per week  . Drug use: No  . Sexual activity: No   Other Topics Concern  . Not on file   Social History Narrative  . No narrative on file     Current Outpatient Prescriptions:  .  ALPRAZolam (XANAX) 0.25 MG tablet, Take 1 tablet (0.25 mg total) by mouth daily as needed., Disp: 30 tablet, Rfl: 0 .  buPROPion (WELLBUTRIN XL) 300 MG 24 hr tablet, Take 1 tablet (300 mg total) by mouth daily., Disp: 90 tablet, Rfl: 0 .  busPIRone (BUSPAR) 15 MG tablet, Take 1 tablet (15 mg total) by mouth 2 (two) times daily., Disp:  180 tablet, Rfl: 0 .  citalopram (CELEXA) 40 MG tablet, Take 1 tablet (40 mg total) by mouth daily., Disp: 90 tablet, Rfl: 0 .  ibuprofen (ADVIL,MOTRIN) 800 MG tablet, Take 1 tablet (800 mg total) by mouth daily as needed., Disp: 90 tablet, Rfl: 1 .  orphenadrine (NORFLEX) 100 MG tablet, take 1 tablet by mouth twice a day if needed for muscle spasm, Disp: , Rfl: 0 .  oxyCODONE-acetaminophen (PERCOCET/ROXICET) 5-325 MG tablet, Take 1 tablet by mouth every 4 (four) hours as needed for severe pain. PRN, Disp: 180 tablet, Rfl: 0 .  rosuvastatin  (CRESTOR) 20 MG tablet, Take 1 tablet (20 mg total) by mouth daily., Disp: 90 tablet, Rfl: 0 .  tiZANidine (ZANAFLEX) 2 MG tablet, Take 1 tablet (2 mg total) by mouth at bedtime., Disp: 30 tablet, Rfl: 2  No Known Allergies   Review of Systems  Constitutional: Positive for fatigue and irritability. Negative for appetite change.  Respiratory: Positive for shortness of breath.   Gastrointestinal: Negative for bowel incontinence.  Genitourinary: Negative for bladder incontinence.  Musculoskeletal: Positive for back pain. Negative for myalgias.  Neurological: Positive for tingling (occasional tinlging in his hands) and numbness. Negative for headaches.  Psychiatric/Behavioral: Positive for depression. Negative for suicidal ideas. The patient is nervous/anxious. The patient does not have insomnia.      Objective  Vitals:   06/19/17 0903  BP: 115/72  Pulse: 91  Resp: 16  Temp: 98.6 F (37 C)  TempSrc: Oral  SpO2: 96%  Weight: 261 lb 9.6 oz (118.7 kg)  Height: 5\' 8"  (1.727 m)    Physical Exam  Constitutional: He is oriented to person, place, and time and well-developed, well-nourished, and in no distress.  HENT:  Head: Normocephalic and atraumatic.  Cardiovascular: Normal rate, regular rhythm and normal heart sounds.   No murmur heard. Pulmonary/Chest: Effort normal and breath sounds normal. He has no wheezes.  Abdominal: Soft. Bowel sounds are normal.  Musculoskeletal:       Right ankle: He exhibits no swelling.       Left ankle: He exhibits no swelling.       Back:  Neurological: He is alert and oriented to person, place, and time.  Psychiatric: Mood, memory, affect and judgment normal.  Nursing note and vitals reviewed.   Assessment & Plan  1. Anxiety Symptoms of anxiety are stable and controlled on buspirone taken twice daily and alprazolam as needed, patient compliant with controlled substances agreement, understands the dependence potential, side effects and drug  interactions of benzodiazepines - busPIRone (BUSPAR) 15 MG tablet; Take 1 tablet (15 mg total) by mouth 2 (two) times daily.  Dispense: 180 tablet; Refill: 0 - ALPRAZolam (XANAX) 0.25 MG tablet; Take 1 tablet (0.25 mg total) by mouth daily as needed.  Dispense: 30 tablet; Refill: 0  2. Lumbar radiculopathy, chronic Chronic low back pain, controlled on Percocet taken every 4 hours as needed, patient compliant with controlled substances agreement and understands the dependence potential, side effects and drug interactions of opioids - oxyCODONE-acetaminophen (PERCOCET/ROXICET) 5-325 MG tablet; Take 1 tablet by mouth every 4 (four) hours as needed for severe pain. PRN  Dispense: 180 tablet; Refill: 0  3. Major depression, chronic In remission while on Celexa and Wellbutrin. - buPROPion (WELLBUTRIN XL) 300 MG 24 hr tablet; Take 1 tablet (300 mg total) by mouth daily.  Dispense: 90 tablet; Refill: 0 - citalopram (CELEXA) 40 MG tablet; Take 1 tablet (40 mg total) by mouth daily.  Dispense: 90  tablet; Refill: 0  4. Mixed hyperlipidemia  - rosuvastatin (CRESTOR) 20 MG tablet; Take 1 tablet (20 mg total) by mouth daily.  Dispense: 90 tablet; Refill: 0 - Lipid panel - COMPLETE METABOLIC PANEL WITH GFR  5. Nocturnal leg cramps  - tiZANidine (ZANAFLEX) 2 MG tablet; Take 1 tablet (2 mg total) by mouth at bedtime.  Dispense: 30 tablet; Refill: 2  6. Need for influenza vaccination  - Flu Vaccine QUAD 6+ mos PF IM (Fluarix Quad PF)  Kerah Hardebeck Asad A. [facility] 06/19/2017 9:09 AM

## 2017-06-20 ENCOUNTER — Ambulatory Visit: Payer: Medicare PPO | Admitting: Family Medicine

## 2017-07-18 ENCOUNTER — Ambulatory Visit (INDEPENDENT_AMBULATORY_CARE_PROVIDER_SITE_OTHER): Payer: Medicare PPO | Admitting: Family Medicine

## 2017-07-18 ENCOUNTER — Encounter: Payer: Self-pay | Admitting: Family Medicine

## 2017-07-18 DIAGNOSIS — M5416 Radiculopathy, lumbar region: Secondary | ICD-10-CM | POA: Diagnosis not present

## 2017-07-18 MED ORDER — IBUPROFEN 800 MG PO TABS: 800.0000 mg | ORAL_TABLET | Freq: Every day | ORAL | 1 refills | Status: DC | PRN

## 2017-07-18 MED ORDER — OXYCODONE-ACETAMINOPHEN 5-325 MG PO TABS: 1.0000 | ORAL_TABLET | ORAL | 0 refills | Status: DC | PRN

## 2017-07-18 NOTE — Progress Notes (Signed)
The following letter was provided to [facility] during their visit today: ---------------------------------------  Dear valued [facility] Patient,  I am writing to share that as of November 15, 2017, I will no longer be seeing patients at [facility]. While it has been my privilege to care for you as a physician, I have decided to move outside of [place] to pursue other opportunities.  The staff at [facility] has been supportive of my decision and are supportive of any patients who have been under my care.  They will be happy to provide care to you and your family.  The office staff will do everything they can to ensure a seamless transition of care at [facility].  However if you are on any controlled substance medications (i.e. Pain medication or benzodiazepines), they will no longer be able to refill those medications, but we will be more than happy to refer you to a specialist.  [facility] will also assist you with the transfer of medical records should you wish to seek care elsewhere.  If you have any questions about your future care, you may call the office at [phone].  I have enjoyed getting to know my patients here and I wish you the very best.  Sincerely,  Rochel Brome, MD  ---------------------------------------  A written copy of this letter was given to the patient.  The patient verbalizes understanding of the letter, and does request referral to specialist or to new primary care provider.  This decision has been conveyed to Dr. Manuella Ghazi, who will place any appropriate referrals during today's visit.

## 2017-07-18 NOTE — Progress Notes (Signed)
Name: John Garrett   MRN: [id number]    DOB: 1961-01-30   Date:07/18/2017       Progress Note  Subjective  Chief Complaint  Chief Complaint  Patient presents with  . Medication Refill    pain meds     Back Pain  This is a chronic problem. The problem is unchanged. The pain is present in the gluteal and lumbar spine. The quality of the pain is described as shooting. The pain radiates to the right thigh and right knee. The pain is at a severity of 6/10. The pain is moderate. The symptoms are aggravated by position and sitting (prolonged sitting makes it worse, standing for some time hurts his back). Associated symptoms include leg pain (right leg pain). Pertinent negatives include no bladder incontinence, bowel incontinence or numbness. He has tried analgesics for the symptoms. The treatment provided significant relief.     Past Medical History:  Diagnosis Date  . Anxiety   . Chronic back pain   . Chronic pain   . Depression   . Hyperlipidemia     Past Surgical History:  Procedure Laterality Date  . CATARACT EXTRACTION Right 2014    Family History  Problem Relation Age of Onset  . Adopted: Yes  . Family history unknown: Yes    Social History   Social History  . Marital status: Single    Spouse name: N/A  . Number of children: N/A  . Years of education: N/A   Occupational History  . Not on file.   Social History Main Topics  . Smoking status: Current Every Day Smoker    Packs/day: 0.00    Types: E-cigarettes  . Smokeless tobacco: Former Systems developer    Types: Chew     Comment: decreasing nicotene in [place]  . Alcohol use 7.2 oz/week    12 Cans of beer per week  . Drug use: No  . Sexual activity: No   Other Topics Concern  . Not on file   Social History Narrative  . No narrative on file     Current Outpatient Prescriptions:  .  ALPRAZolam (XANAX) 0.25 MG tablet, Take 1 tablet (0.25 mg total) by mouth daily as needed., Disp: 30 tablet, Rfl: 0 .  buPROPion  (WELLBUTRIN XL) 300 MG 24 hr tablet, Take 1 tablet (300 mg total) by mouth daily., Disp: 90 tablet, Rfl: 0 .  busPIRone (BUSPAR) 15 MG tablet, Take 1 tablet (15 mg total) by mouth 2 (two) times daily., Disp: 180 tablet, Rfl: 0 .  citalopram (CELEXA) 40 MG tablet, Take 1 tablet (40 mg total) by mouth daily., Disp: 90 tablet, Rfl: 0 .  ibuprofen (ADVIL,MOTRIN) 800 MG tablet, Take 1 tablet (800 mg total) by mouth daily as needed., Disp: 90 tablet, Rfl: 1 .  orphenadrine (NORFLEX) 100 MG tablet, take 1 tablet by mouth twice a day if needed for muscle spasm, Disp: , Rfl: 0 .  oxyCODONE-acetaminophen (PERCOCET/ROXICET) 5-325 MG tablet, Take 1 tablet by mouth every 4 (four) hours as needed for severe pain. PRN, Disp: 180 tablet, Rfl: 0 .  rosuvastatin (CRESTOR) 20 MG tablet, Take 1 tablet (20 mg total) by mouth daily., Disp: 90 tablet, Rfl: 0 .  tiZANidine (ZANAFLEX) 2 MG tablet, Take 1 tablet (2 mg total) by mouth at bedtime., Disp: 30 tablet, Rfl: 2  No Known Allergies   Review of Systems  Gastrointestinal: Negative for bowel incontinence.  Genitourinary: Negative for bladder incontinence.  Musculoskeletal: Positive for back pain.  Neurological: Negative  for numbness.     Objective  Vitals:   07/18/17 1057  BP: 114/78  Pulse: 92  Resp: 16  Temp: 98.4 F (36.9 C)  TempSrc: Oral  SpO2: 94%  Weight: 256 lb 1.6 oz (116.2 kg)  Height: 5\' 4"  (1.626 m)    Physical Exam  Constitutional: He is oriented to person, place, and time and well-developed, well-nourished, and in no distress.  HENT:  Head: Normocephalic and atraumatic.  Cardiovascular: Normal rate, regular rhythm and normal heart sounds.   No murmur heard. Pulmonary/Chest: Effort normal and breath sounds normal. He has no wheezes.  Musculoskeletal:       Lumbar back: He exhibits tenderness, pain and spasm.       Back:  Neurological: He is alert and oriented to person, place, and time.  Nursing note and vitals  reviewed.    Assessment & Plan  1. Lumbar radiculopathy, chronic Stable on opioid treatment, advised of my pending departure from this practice and that he will need to be referred to a pain clinic for management of chronic low back pain. Refills provided. - oxyCODONE-acetaminophen (PERCOCET/ROXICET) 5-325 MG tablet; Take 1 tablet by mouth every 4 (four) hours as needed for severe pain. PRN  Dispense: 180 tablet; Refill: 0 - ibuprofen (ADVIL,MOTRIN) 800 MG tablet; Take 1 tablet (800 mg total) by mouth daily as needed.  Dispense: 90 tablet; Refill: 1 - Ambulatory referral to Pain Clinic   [facility] A. [facility] 07/18/2017 11:17 AM

## 2017-07-30 ENCOUNTER — Ambulatory Visit
Payer: Medicare PPO | Attending: Student in an Organized Health Care Education/Training Program | Admitting: Student in an Organized Health Care Education/Training Program

## 2017-07-30 ENCOUNTER — Encounter: Payer: Self-pay | Admitting: Student in an Organized Health Care Education/Training Program

## 2017-07-30 VITALS — BP 146/81 | HR 79 | Temp 98.3°F | Resp 16 | Ht 68.0 in | Wt 263.0 lb

## 2017-07-30 DIAGNOSIS — F1120 Opioid dependence, uncomplicated: Secondary | ICD-10-CM | POA: Insufficient documentation

## 2017-07-30 DIAGNOSIS — G894 Chronic pain syndrome: Secondary | ICD-10-CM

## 2017-07-30 DIAGNOSIS — E785 Hyperlipidemia, unspecified: Secondary | ICD-10-CM | POA: Insufficient documentation

## 2017-07-30 DIAGNOSIS — Z6839 Body mass index (BMI) 39.0-39.9, adult: Secondary | ICD-10-CM | POA: Diagnosis not present

## 2017-07-30 DIAGNOSIS — M5441 Lumbago with sciatica, right side: Secondary | ICD-10-CM

## 2017-07-30 DIAGNOSIS — G8929 Other chronic pain: Secondary | ICD-10-CM

## 2017-07-30 DIAGNOSIS — M4802 Spinal stenosis, cervical region: Secondary | ICD-10-CM | POA: Diagnosis not present

## 2017-07-30 DIAGNOSIS — F17200 Nicotine dependence, unspecified, uncomplicated: Secondary | ICD-10-CM | POA: Insufficient documentation

## 2017-07-30 DIAGNOSIS — M5416 Radiculopathy, lumbar region: Secondary | ICD-10-CM

## 2017-07-30 DIAGNOSIS — M5106 Intervertebral disc disorders with myelopathy, lumbar region: Secondary | ICD-10-CM | POA: Insufficient documentation

## 2017-07-30 DIAGNOSIS — Z79899 Other long term (current) drug therapy: Secondary | ICD-10-CM | POA: Diagnosis not present

## 2017-07-30 DIAGNOSIS — R252 Cramp and spasm: Secondary | ICD-10-CM | POA: Diagnosis not present

## 2017-07-30 DIAGNOSIS — F329 Major depressive disorder, single episode, unspecified: Secondary | ICD-10-CM | POA: Insufficient documentation

## 2017-07-30 DIAGNOSIS — E668 Other obesity: Secondary | ICD-10-CM | POA: Insufficient documentation

## 2017-07-30 DIAGNOSIS — F419 Anxiety disorder, unspecified: Secondary | ICD-10-CM | POA: Insufficient documentation

## 2017-07-30 DIAGNOSIS — M5442 Lumbago with sciatica, left side: Secondary | ICD-10-CM | POA: Diagnosis not present

## 2017-07-30 DIAGNOSIS — M5136 Other intervertebral disc degeneration, lumbar region: Secondary | ICD-10-CM

## 2017-07-30 DIAGNOSIS — M47812 Spondylosis without myelopathy or radiculopathy, cervical region: Secondary | ICD-10-CM | POA: Diagnosis not present

## 2017-07-30 MED ORDER — TIZANIDINE HCL 4 MG PO TABS: 4.0000 mg | ORAL_TABLET | Freq: Two times a day (BID) | ORAL | 1 refills | Status: DC | PRN

## 2017-07-30 NOTE — Progress Notes (Signed)
Safety precautions to be maintained throughout the outpatient stay will include: orient to surroundings, keep bed in low position, maintain call bell within reach at all times, provide assistance with transfer out of bed and ambulation.  

## 2017-07-30 NOTE — Progress Notes (Signed)
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              Gait & Posture Assessment  Ambulation: Limited Gait: Antalgic Posture: Difficulty standing up straight, due to pain   Lower Extremity Exam    Side: Right lower extremity  Side: Left lower extremity  Skin & Extremity Inspection: Skin color, temperature, and hair growth are WNL. No peripheral edema or cyanosis. No masses, redness, swelling, asymmetry, or associated skin lesions. No contractures.  Skin & Extremity Inspection: Skin color, temperature, and hair growth are WNL. No peripheral edema or cyanosis.  No masses, redness, swelling, asymmetry, or associated skin lesions. No contractures.  Functional ROM: Unrestricted ROM          Functional ROM: Unrestricted ROM          Muscle Tone/Strength: Functionally intact. No obvious neuro-muscular anomalies detected.  Muscle Tone/Strength: Functionally intact. No obvious neuro-muscular anomalies detected.  Sensory (Neurological): Unimpaired  Sensory (Neurological): Unimpaired   Palpation: No palpable anomalies  Palpation: No palpable anomalies   Assessment  Primary Diagnosis & Pertinent Problem List: The primary encounter diagnosis was Lumbar radiculopathy, chronic. Diagnoses of Chronic bilateral low back pain with bilateral sciatica, Lumbar degenerative disc disease, Spondylosis of cervical region without myelopathy or radiculopathy, and Chronic pain syndrome were also pertinent to this visit.  Visit Diagnosis (New problems to examiner): 1. Lumbar radiculopathy, chronic   2. Chronic bilateral low back pain with bilateral sciatica   3. Lumbar degenerative disc disease   4. Spondylosis of cervical region without myelopathy or radiculopathy   5. Chronic pain syndrome    Plan of Care (Initial workup plan)  Note: Please be advised that as per protocol, today's visit has been an evaluation only. We have not taken over the patient's controlled substance management.  General Recommendations: The pain condition that the patient suffers from is best treated with a multidisciplinary approach that involves an increase in physical activity to prevent de-conditioning and worsening of the pain cycle, as well as psychological counseling (formal and/or informal) to address the co-morbid psychological affects of pain. Treatment will often involve judicious use of pain medications and interventional procedures to decrease the pain, allowing the patient to participate in the physical activity that will ultimately produce long-lasting pain reductions. The goal of the multidisciplinary approach is to return the patient to a higher level of overall function and to restore their ability to perform activities of daily living.   56 year old male presents with neck pain and axial low back pain with radiation to right greater than left leg. This is secondary to lumbar radiculopathy, lumbar degenerative disc disease, lumbar spondylosis, cervical spondylosis, cervical degenerative disc disease.  For his low back pain with radiation, we discussed lumbar epidural steroid injection. Risks and benefits were discussed. Patient would like to proceed. After this we can address his neck and consider cervical facet blocks.  In regards to medication management, I advised the patient to discontinue Xanax if he wants to be considered for opioid therapy here. Patient gets up Xanax 0.25 mg, quantity 30 with refills on 06/19/2017 and 02/19/2017. He rarely uses this medication and states that he will discontinue. I will increase his tizanidine dose and frequency to help with his muscle spasms and also assist with sleep.   Plan: -Urine drug screen today. Should be positive for oxycodone and its metabolites along with alprazolam and its metabolites. Repeat UDS at next visit should be negative for alprazolam. -Referral to pain psychology for evaluation of substance abuse disorder. -Schedule for lumbar epidural steroid injection. -Increase tizanidine to 4 mg twice daily as needed for muscle spasms. -Patient counseled to discontinue Xanax and informed him that if he is to receive opioid therapy here, he cannot use benzodiazepines.  Ordered Lab-work, Procedure(s), Referral(s), & Consult(s): Orders Placed This Encounter  Procedures  . Lumbar Epidural Injection  . Compliance Drug Analysis, Ur  . Ambulatory referral to Psychology   Pharmacotherapy (current): Medications ordered:  Meds ordered this encounter  Medications  . tiZANidine (ZANAFLEX) 4 MG tablet    Sig: Take 1 tablet (4 mg total)  by mouth 2 (two) times daily as needed for muscle spasms.    Dispense:  60 tablet    Refill:  1   Medications administered during this visit: John Garrett had no medications administered during this visit.   Pharmacological management options:  Opioid Analgesics: The patient was informed that there is no guarantee that he would be a candidate for opioid analgesics. The decision will be made following CDC guidelines.  This decision will be based on the results of diagnostic studies, as well as John Garrett risk profile.   Membrane stabilizer: To be determined at a later time  Muscle relaxant: To be determined at a later time  NSAID: To be determined at a later time  Other analgesic(s): To be determined at a later time   Interventional management options: John Garrett was informed that there is no guarantee that he would be a candidate for interventional therapies. The decision will be based on the results of diagnostic studies, as well as John Garrett risk profile.  Procedure(s) under consideration:  -Lumbar epidural steroid injection -Lumbar bilateral facet blocks, goal radio frequency ablation -Bilateral sacroiliac joint injection -Cervical medial branch nerve blocks with goal radiofrequency ablation    Provider-requested follow-up: Return for Procedure, After Psychological evaluation.  Future Appointments Date Time Provider [place]  10/18/2017 10:40 AM Roselee Nova, MD [facility]    Primary Care Physician: Roselee Nova, MD Location: [facility] Note by: [name], M.D, Date: 07/30/2017; Time: 3:20 PM  Patient Instructions   1. Urine drug screen today. Please stop Xanax 2. Referral to Pain Psychology 3. Schedule for lumbar epidural steroid injection AFTER you have seen pain psychology- we will do procedure and medication management visit on same day. MAKE SURE you have evaluation from pain psych or else I will not be able to prescribe opioid medication.  4. Increase Tizanidine dose to 4 mg twice daily as needed for spasms/anxiety GENERAL RISKS AND COMPLICATIONS  What are the risk, side effects and possible complications? Generally speaking, most procedures are safe.  However, with any procedure there are risks, side effects, and the possibility of complications.  The risks and complications are dependent upon the sites that are lesioned, or the  type of nerve block to be performed.  The closer the procedure is to the spine, the more serious the risks are.  Great care is taken when placing the radio frequency needles, block needles or lesioning probes, but sometimes complications can occur. 1. Infection: Any time there is an injection through the skin, there is a risk of infection.  This is why sterile conditions are used for these blocks.  There are four possible types of infection. 1. Localized skin infection. 2. Central Nervous System Infection-This can be in the form of Meningitis, which can be deadly. 3. Epidural Infections-This can be in the form of an epidural abscess, which can cause pressure inside of the spine, causing compression of the spinal cord with subsequent paralysis. This would require an emergency surgery to decompress, and there are no guarantees that the patient would recover from the paralysis. 4. Discitis-This is an infection of the intervertebral discs.  It occurs in about 1% of discography procedures.  It is difficult to treat and it may lead to surgery.        2. Pain: the needles have to go through skin and soft tissues, will cause soreness.       3. Damage to internal structures:  The nerves to be lesioned may be near blood vessels or    other nerves which can be potentially damaged.       4. Bleeding: Bleeding is more common if the patient is taking blood thinners such as  aspirin, Coumadin, Ticiid, Plavix, etc., or if he/she have some genetic predisposition  such as hemophilia. Bleeding into the spinal canal can cause compression of the spinal  cord with subsequent paralysis.  This would require an emergency surgery to  decompress and there are no guarantees that the patient would recover from the  paralysis.       5. Pneumothorax:  Puncturing of a lung is a possibility, every time a needle is introduced in  the area of the chest or upper back.  Pneumothorax refers to free air around the  collapsed lung(s), inside of  the thoracic cavity (chest cavity).  Another two possible  complications related to a similar event would include: Hemothorax and Chylothorax.   These are variations of the Pneumothorax, where instead of air around the collapsed  lung(s), you may have blood or chyle, respectively.       6. Spinal headaches: They may occur with any procedures in the area of the spine.       7. Persistent CSF (Cerebro-Spinal Fluid) leakage: This is a rare problem, but may occur  with prolonged intrathecal or epidural catheters either due to the formation of a fistulous  track or a dural tear.       8. Nerve damage: By working so close to the spinal cord, there is always a possibility of  nerve damage, which could be as serious as a permanent spinal cord injury with  paralysis.       9. Death:  Although rare, severe deadly allergic reactions known as "Anaphylactic  reaction" can occur to any of the medications used.      10. Worsening of the symptoms:  We can always make thing worse.  What are the chances of something like this happening? Chances of any of this occuring are extremely low.  By statistics, you have more of a chance of getting killed in a motor vehicle accident: while driving to the hospital than any of the above occurring .  Nevertheless, you should be aware that they are possibilities.  In general, it is similar to taking a shower.  Everybody knows that you can slip, hit your head and get killed.  Does that mean that you should not shower again?  Nevertheless always keep in mind that statistics do not mean anything if you happen to be on the wrong side of them.  Even if a procedure has a 1 (one) in a 1,000,000 (million) chance of going wrong, it you happen to be that one..Also, keep in mind that by statistics, you have more of a chance of having something go wrong when taking medications.  Who should not have this procedure? If you are on a blood thinning medication (e.g. Coumadin, Plavix, see list of "Blood  Thinners"), or if you have an active infection going on, you should not have the procedure.  If you are taking any blood thinners, please inform your physician.  How should I prepare for this procedure?  Do not eat or drink anything at least six hours prior to the procedure.  Bring a driver with you .  It cannot be a taxi.  Come accompanied by an adult that can drive you back, and that is strong enough to help  you if your legs get weak or numb from the local anesthetic.  Take all of your medicines the morning of the procedure with just enough water to swallow them.  If you have diabetes, make sure that you are scheduled to have your procedure done first thing in the morning, whenever possible.  If you have diabetes, take only half of your insulin dose and notify our nurse that you have done so as soon as you arrive at the clinic.  If you are diabetic, but only take blood sugar pills (oral hypoglycemic), then do not take them on the morning of your procedure.  You may take them after you have had the procedure.  Do not take aspirin or any aspirin-containing medications, at least eleven (11) days prior to the procedure.  They may prolong bleeding.  Wear loose fitting clothing that may be easy to take off and that you would not mind if it got stained with Betadine or blood.  Do not wear any jewelry or perfume  Remove any nail coloring.  It will interfere with some of our monitoring equipment.  NOTE: Remember that this is not meant to be interpreted as a complete list of all possible complications.  Unforeseen problems may occur.  BLOOD THINNERS The following drugs contain aspirin or other products, which can cause increased bleeding during surgery and should not be taken for 2 weeks prior to and 1 week after surgery.  If you should need take something for relief of minor pain, you may take acetaminophen which is found in Tylenol,m Datril, Anacin-3 and Panadol. It is not blood thinner. The  products listed below are.  Do not take any of the products listed below in addition to any listed on your instruction sheet.  A.P.C or A.P.C with Codeine Codeine Phosphate Capsules #3 Ibuprofen Ridaura  ABC compound Congesprin Imuran rimadil  Advil Cope Indocin Robaxisal  Alka-Seltzer Effervescent Pain Reliever and Antacid Coricidin or Coricidin-D  Indomethacin Rufen  Alka-Seltzer plus Cold Medicine Cosprin Ketoprofen S-A-C Tablets  Anacin Analgesic Tablets or Capsules Coumadin Korlgesic Salflex  Anacin Extra Strength Analgesic tablets or capsules CP-2 Tablets Lanoril Salicylate  Anaprox Cuprimine Capsules Levenox Salocol  Anexsia-D Dalteparin Magan Salsalate  Anodynos Darvon compound Magnesium Salicylate Sine-off  Ansaid Dasin Capsules Magsal Sodium Salicylate  Anturane Depen Capsules Marnal Soma  APF Arthritis pain formula Dewitt's Pills Measurin Stanback  Argesic Dia-Gesic Meclofenamic Sulfinpyrazone  Arthritis Bayer Timed Release Aspirin Diclofenac Meclomen Sulindac  Arthritis pain formula Anacin Dicumarol Medipren Supac  Analgesic (Safety coated) Arthralgen Diffunasal Mefanamic Suprofen  Arthritis Strength Bufferin Dihydrocodeine Mepro Compound Suprol  Arthropan liquid Dopirydamole Methcarbomol with Aspirin Synalgos  ASA tablets/Enseals Disalcid Micrainin Tagament  Ascriptin Doan's Midol Talwin  Ascriptin A/D Dolene Mobidin Tanderil  Ascriptin Extra Strength Dolobid Moblgesic Ticlid  Ascriptin with Codeine Doloprin or Doloprin with Codeine Momentum Tolectin  Asperbuf Duoprin Mono-gesic Trendar  Aspergum Duradyne Motrin or Motrin IB Triminicin  Aspirin plain, buffered or enteric coated Durasal Myochrisine Trigesic  Aspirin Suppositories Easprin Nalfon Trillsate  Aspirin with Codeine Ecotrin Regular or Extra Strength Naprosyn Uracel  Atromid-S Efficin Naproxen Ursinus  Auranofin Capsules Elmiron Neocylate Vanquish  Axotal Emagrin Norgesic Verin  Azathioprine Empirin or Empirin  with Codeine Normiflo Vitamin E  Azolid Emprazil Nuprin Voltaren  Bayer Aspirin plain, buffered or children's or timed BC Tablets or powders Encaprin Orgaran Warfarin Sodium  Buff-a-Comp Enoxaparin Orudis Zorpin  Buff-a-Comp with Codeine Equegesic Os-Cal-Gesic   Buffaprin Excedrin plain, buffered or Extra Strength Oxalid  Bufferin Arthritis Strength Feldene Oxphenbutazone   Bufferin plain or Extra Strength Feldene Capsules Oxycodone with Aspirin   Bufferin with Codeine Fenoprofen Fenoprofen Pabalate or Pabalate-SF   Buffets II Flogesic Panagesic   Buffinol plain or Extra Strength Florinal or Florinal with Codeine Panwarfarin   Buf-Tabs Flurbiprofen Penicillamine   Butalbital Compound Four-way cold tablets Penicillin   Butazolidin Fragmin Pepto-Bismol   Carbenicillin Geminisyn Percodan   Carna Arthritis Reliever Geopen Persantine   Carprofen Gold's salt Persistin   Chloramphenicol Goody's Phenylbutazone   Chloromycetin Haltrain Piroxlcam   Clmetidine heparin Plaquenil   Cllnoril Hyco-pap Ponstel   Clofibrate Hydroxy chloroquine Propoxyphen         Before stopping any of these medications, be sure to consult the physician who ordered them.  Some, such as Coumadin (Warfarin) are ordered to prevent or treat serious conditions such as "deep thrombosis", "pumonary embolisms", and other heart problems.  The amount of time that you may need off of the medication may also vary with the medication and the reason for which you were taking it.  If you are taking any of these medications, please make sure you notify your pain physician before you undergo any procedures.          Epidural Steroid Injection An epidural steroid injection is a shot of steroid medicine and numbing medicine that is given into the space between the spinal cord and the bones in your back (epidural space). The shot helps relieve pain caused by an irritated or swollen nerve root. The amount of pain relief you get from  the injection depends on what is causing the nerve to be swollen and irritated, and how long your pain lasts. You are more likely to benefit from this injection if your pain is strong and comes on suddenly rather than if you have had pain for a long time. Tell a health care provider about:  Any allergies you have.  All medicines you are taking, including vitamins, herbs, eye drops, creams, and over-the-counter medicines.  Any problems you or family members have had with anesthetic medicines.  Any blood disorders you have.  Any surgeries you have had.  Any medical conditions you have.  Whether you are pregnant or may be pregnant. What are the risks? Generally, this is a safe procedure. However, problems may occur, including:  Headache.  Bleeding.  Infection.  Allergic reaction to medicines.  Damage to your nerves.  What happens before the procedure? Staying hydrated Follow instructions from your health care provider about hydration, which may include:  Up to 2 hours before the procedure - you may continue to drink clear liquids, such as water, clear fruit juice, black coffee, and plain tea.  Eating and drinking restrictions Follow instructions from your health care provider about eating and drinking, which may include:  8 hours before the procedure - stop eating heavy meals or foods such as meat, fried foods, or fatty foods.  6 hours before the procedure - stop eating light meals or foods, such as toast or cereal.  6 hours before the procedure - stop drinking milk or drinks that contain milk.  2 hours before the procedure - stop drinking clear liquids.  Medicine  You may be given medicines to lower anxiety.  Ask your health care provider about: ? Changing or stopping your regular medicines. This is especially important if you are taking diabetes medicines or blood thinners. ? Taking medicines such as aspirin and ibuprofen. These medicines can thin your blood. Do not  take these medicines before your procedure if your health care provider instructs you not to. General instructions  Plan to have someone take you home from the hospital or clinic. What happens during the procedure?  You may receive a medicine to help you relax (sedative).  You will be asked to lie on your abdomen.  The injection site will be cleaned.  A numbing medicine (local anesthetic) will be used to numb the injection site.  A needle will be inserted through your skin into the epidural space. You may feel some discomfort when this happens. An X-ray machine will be used to make sure the needle is put as close as possible to the affected nerve.  A steroid medicine and a local anesthetic will be injected into the epidural space.  The needle will be removed.  A bandage (dressing) will be put over the injection site. What happens after the procedure?  Your blood pressure, heart rate, breathing rate, and blood oxygen level will be monitored until the medicines you were given have worn off.  Your arm or leg may feel weak or numb for a few hours.  The injection site may feel sore.  Do not drive for 24 hours if you received a sedative. This information is not intended to replace advice given to you by your health care provider. Make sure you discuss any questions you have with your health care provider. Document Released: 12/25/2007 Document Revised: 02/29/2016 Document Reviewed: 01/03/2016 Elsevier Interactive Patient Education  2017 [employer].

## 2017-07-30 NOTE — Patient Instructions (Addendum)
1. Urine drug screen today. Please stop Xanax 2. Referral to Pain Psychology 3. Schedule for lumbar epidural steroid injection AFTER you have seen pain psychology- we will do procedure and medication management visit on same day. MAKE SURE you have evaluation from pain psych or else I will not be able to prescribe opioid medication.  4. Increase Tizanidine dose to 4 mg twice daily as needed for spasms/anxiety GENERAL RISKS AND COMPLICATIONS  What are the risk, side effects and possible complications? Generally speaking, most procedures are safe.  However, with any procedure there are risks, side effects, and the possibility of complications.  The risks and complications are dependent upon the sites that are lesioned, or the type of nerve block to be performed.  The closer the procedure is to the spine, the more serious the risks are.  Great care is taken when placing the radio frequency needles, block needles or lesioning probes, but sometimes complications can occur. 1. Infection: Any time there is an injection through the skin, there is a risk of infection.  This is why sterile conditions are used for these blocks.  There are four possible types of infection. 1. Localized skin infection. 2. Central Nervous System Infection-This can be in the form of Meningitis, which can be deadly. 3. Epidural Infections-This can be in the form of an epidural abscess, which can cause pressure inside of the spine, causing compression of the spinal cord with subsequent paralysis. This would require an emergency surgery to decompress, and there are no guarantees that the patient would recover from the paralysis. 4. Discitis-This is an infection of the intervertebral discs.  It occurs in about 1% of discography procedures.  It is difficult to treat and it may lead to surgery.        2. Pain: the needles have to go through skin and soft tissues, will cause soreness.       3. Damage to internal structures:  The nerves to  be lesioned may be near blood vessels or    other nerves which can be potentially damaged.       4. Bleeding: Bleeding is more common if the patient is taking blood thinners such as  aspirin, Coumadin, Ticiid, Plavix, etc., or if he/she have some genetic predisposition  such as hemophilia. Bleeding into the spinal canal can cause compression of the spinal  cord with subsequent paralysis.  This would require an emergency surgery to  decompress and there are no guarantees that the patient would recover from the  paralysis.       5. Pneumothorax:  Puncturing of a lung is a possibility, every time a needle is introduced in  the area of the chest or upper back.  Pneumothorax refers to free air around the  collapsed lung(s), inside of the thoracic cavity (chest cavity).  Another two possible  complications related to a similar event would include: Hemothorax and Chylothorax.   These are variations of the Pneumothorax, where instead of air around the collapsed  lung(s), you may have blood or chyle, respectively.       6. Spinal headaches: They may occur with any procedures in the area of the spine.       7. Persistent CSF (Cerebro-Spinal Fluid) leakage: This is a rare problem, but may occur  with prolonged intrathecal or epidural catheters either due to the formation of a fistulous  track or a dural tear.       8. Nerve damage: By working so close to the  spinal cord, there is always a possibility of  nerve damage, which could be as serious as a permanent spinal cord injury with  paralysis.       9. Death:  Although rare, severe deadly allergic reactions known as "Anaphylactic  reaction" can occur to any of the medications used.      10. Worsening of the symptoms:  We can always make thing worse.  What are the chances of something like this happening? Chances of any of this occuring are extremely low.  By statistics, you have more of a chance of getting killed in a motor vehicle accident: while driving to the  hospital than any of the above occurring .  Nevertheless, you should be aware that they are possibilities.  In general, it is similar to taking a shower.  Everybody knows that you can slip, hit your head and get killed.  Does that mean that you should not shower again?  Nevertheless always keep in mind that statistics do not mean anything if you happen to be on the wrong side of them.  Even if a procedure has a 1 (one) in a 1,000,000 (million) chance of going wrong, it you happen to be that one..Also, keep in mind that by statistics, you have more of a chance of having something go wrong when taking medications.  Who should not have this procedure? If you are on a blood thinning medication (e.g. Coumadin, Plavix, see list of "Blood Thinners"), or if you have an active infection going on, you should not have the procedure.  If you are taking any blood thinners, please inform your physician.  How should I prepare for this procedure?  Do not eat or drink anything at least six hours prior to the procedure.  Bring a driver with you .  It cannot be a taxi.  Come accompanied by an adult that can drive you back, and that is strong enough to help you if your legs get weak or numb from the local anesthetic.  Take all of your medicines the morning of the procedure with just enough water to swallow them.  If you have diabetes, make sure that you are scheduled to have your procedure done first thing in the morning, whenever possible.  If you have diabetes, take only half of your insulin dose and notify our nurse that you have done so as soon as you arrive at the clinic.  If you are diabetic, but only take blood sugar pills (oral hypoglycemic), then do not take them on the morning of your procedure.  You may take them after you have had the procedure.  Do not take aspirin or any aspirin-containing medications, at least eleven (11) days prior to the procedure.  They may prolong bleeding.  Wear loose fitting  clothing that may be easy to take off and that you would not mind if it got stained with Betadine or blood.  Do not wear any jewelry or perfume  Remove any nail coloring.  It will interfere with some of our monitoring equipment.  NOTE: Remember that this is not meant to be interpreted as a complete list of all possible complications.  Unforeseen problems may occur.  BLOOD THINNERS The following drugs contain aspirin or other products, which can cause increased bleeding during surgery and should not be taken for 2 weeks prior to and 1 week after surgery.  If you should need take something for relief of minor pain, you may take acetaminophen which is found in  Tylenol,m Datril, Anacin-3 and Panadol. It is not blood thinner. The products listed below are.  Do not take any of the products listed below in addition to any listed on your instruction sheet.  A.P.C or A.P.C with Codeine Codeine Phosphate Capsules #3 Ibuprofen Ridaura  ABC compound Congesprin Imuran rimadil  Advil Cope Indocin Robaxisal  Alka-Seltzer Effervescent Pain Reliever and Antacid Coricidin or Coricidin-D  Indomethacin Rufen  Alka-Seltzer plus Cold Medicine Cosprin Ketoprofen S-A-C Tablets  Anacin Analgesic Tablets or Capsules Coumadin Korlgesic Salflex  Anacin Extra Strength Analgesic tablets or capsules CP-2 Tablets Lanoril Salicylate  Anaprox Cuprimine Capsules Levenox Salocol  Anexsia-D Dalteparin Magan Salsalate  Anodynos Darvon compound Magnesium Salicylate Sine-off  Ansaid Dasin Capsules Magsal Sodium Salicylate  Anturane Depen Capsules Marnal Soma  APF Arthritis pain formula Dewitt's Pills Measurin Stanback  Argesic Dia-Gesic Meclofenamic Sulfinpyrazone  Arthritis Bayer Timed Release Aspirin Diclofenac Meclomen Sulindac  Arthritis pain formula Anacin Dicumarol Medipren Supac  Analgesic (Safety coated) Arthralgen Diffunasal Mefanamic Suprofen  Arthritis Strength Bufferin Dihydrocodeine Mepro Compound Suprol   Arthropan liquid Dopirydamole Methcarbomol with Aspirin Synalgos  ASA tablets/Enseals Disalcid Micrainin Tagament  Ascriptin Doan's Midol Talwin  Ascriptin A/D Dolene Mobidin Tanderil  Ascriptin Extra Strength Dolobid Moblgesic Ticlid  Ascriptin with Codeine Doloprin or Doloprin with Codeine Momentum Tolectin  Asperbuf Duoprin Mono-gesic Trendar  Aspergum Duradyne Motrin or Motrin IB Triminicin  Aspirin plain, buffered or enteric coated Durasal Myochrisine Trigesic  Aspirin Suppositories Easprin Nalfon Trillsate  Aspirin with Codeine Ecotrin Regular or Extra Strength Naprosyn Uracel  Atromid-S Efficin Naproxen Ursinus  Auranofin Capsules Elmiron Neocylate Vanquish  Axotal Emagrin Norgesic Verin  Azathioprine Empirin or Empirin with Codeine Normiflo Vitamin E  Azolid Emprazil Nuprin Voltaren  Bayer Aspirin plain, buffered or children's or timed BC Tablets or powders Encaprin Orgaran Warfarin Sodium  Buff-a-Comp Enoxaparin Orudis Zorpin  Buff-a-Comp with Codeine Equegesic Os-Cal-Gesic   Buffaprin Excedrin plain, buffered or Extra Strength Oxalid   Bufferin Arthritis Strength Feldene Oxphenbutazone   Bufferin plain or Extra Strength Feldene Capsules Oxycodone with Aspirin   Bufferin with Codeine Fenoprofen Fenoprofen Pabalate or Pabalate-SF   Buffets II Flogesic Panagesic   Buffinol plain or Extra Strength Florinal or Florinal with Codeine Panwarfarin   Buf-Tabs Flurbiprofen Penicillamine   Butalbital Compound Four-way cold tablets Penicillin   Butazolidin Fragmin Pepto-Bismol   Carbenicillin Geminisyn Percodan   Carna Arthritis Reliever Geopen Persantine   Carprofen Gold's salt Persistin   Chloramphenicol Goody's Phenylbutazone   Chloromycetin Haltrain Piroxlcam   Clmetidine heparin Plaquenil   Cllnoril Hyco-pap Ponstel   Clofibrate Hydroxy chloroquine Propoxyphen         Before stopping any of these medications, be sure to consult the physician who ordered them.  Some, such as  Coumadin (Warfarin) are ordered to prevent or treat serious conditions such as "deep thrombosis", "pumonary embolisms", and other heart problems.  The amount of time that you may need off of the medication may also vary with the medication and the reason for which you were taking it.  If you are taking any of these medications, please make sure you notify your pain physician before you undergo any procedures.          Epidural Steroid Injection An epidural steroid injection is a shot of steroid medicine and numbing medicine that is given into the space between the spinal cord and the bones in your back (epidural space). The shot helps relieve pain caused by an irritated or swollen nerve root. The amount of pain relief  you get from the injection depends on what is causing the nerve to be swollen and irritated, and how long your pain lasts. You are more likely to benefit from this injection if your pain is strong and comes on suddenly rather than if you have had pain for a long time. Tell a health care provider about:  Any allergies you have.  All medicines you are taking, including vitamins, herbs, eye drops, creams, and over-the-counter medicines.  Any problems you or family members have had with anesthetic medicines.  Any blood disorders you have.  Any surgeries you have had.  Any medical conditions you have.  Whether you are pregnant or may be pregnant. What are the risks? Generally, this is a safe procedure. However, problems may occur, including:  Headache.  Bleeding.  Infection.  Allergic reaction to medicines.  Damage to your nerves.  What happens before the procedure? Staying hydrated Follow instructions from your health care provider about hydration, which may include:  Up to 2 hours before the procedure - you may continue to drink clear liquids, such as water, clear fruit juice, black coffee, and plain tea.  Eating and drinking restrictions Follow instructions  from your health care provider about eating and drinking, which may include:  8 hours before the procedure - stop eating heavy meals or foods such as meat, fried foods, or fatty foods.  6 hours before the procedure - stop eating light meals or foods, such as toast or cereal.  6 hours before the procedure - stop drinking milk or drinks that contain milk.  2 hours before the procedure - stop drinking clear liquids.  Medicine  You may be given medicines to lower anxiety.  Ask your health care provider about: ? Changing or stopping your regular medicines. This is especially important if you are taking diabetes medicines or blood thinners. ? Taking medicines such as aspirin and ibuprofen. These medicines can thin your blood. Do not take these medicines before your procedure if your health care provider instructs you not to. General instructions  Plan to have someone take you home from the hospital or clinic. What happens during the procedure?  You may receive a medicine to help you relax (sedative).  You will be asked to lie on your abdomen.  The injection site will be cleaned.  A numbing medicine (local anesthetic) will be used to numb the injection site.  A needle will be inserted through your skin into the epidural space. You may feel some discomfort when this happens. An X-ray machine will be used to make sure the needle is put as close as possible to the affected nerve.  A steroid medicine and a local anesthetic will be injected into the epidural space.  The needle will be removed.  A bandage (dressing) will be put over the injection site. What happens after the procedure?  Your blood pressure, heart rate, breathing rate, and blood oxygen level will be monitored until the medicines you were given have worn off.  Your arm or leg may feel weak or numb for a few hours.  The injection site may feel sore.  Do not drive for 24 hours if you received a sedative. This information  is not intended to replace advice given to you by your health care provider. Make sure you discuss any questions you have with your health care provider. Document Released: 12/25/2007 Document Revised: 02/29/2016 Document Reviewed: 01/03/2016 Elsevier Interactive Patient Education  2017 [employer].

## 2017-08-04 LAB — COMPLIANCE DRUG ANALYSIS, UR

## 2017-08-12 ENCOUNTER — Ambulatory Visit
Admission: RE | Admit: 2017-08-12 | Discharge: 2017-08-12 | Disposition: A | Discharge status: Home / Self Care | Payer: Medicare PPO | Source: Ambulatory Visit | Attending: Student in an Organized Health Care Education/Training Program | Admitting: Student in an Organized Health Care Education/Training Program

## 2017-08-12 ENCOUNTER — Other Ambulatory Visit: Payer: Self-pay

## 2017-08-12 ENCOUNTER — Ambulatory Visit (HOSPITAL_BASED_OUTPATIENT_CLINIC_OR_DEPARTMENT_OTHER): Payer: Medicare PPO | Admitting: Student in an Organized Health Care Education/Training Program

## 2017-08-12 ENCOUNTER — Encounter: Payer: Self-pay | Admitting: Student in an Organized Health Care Education/Training Program

## 2017-08-12 VITALS — BP 124/88 | HR 90 | Temp 97.3°F | Resp 16 | Ht 67.0 in | Wt 243.0 lb

## 2017-08-12 DIAGNOSIS — M5136 Other intervertebral disc degeneration, lumbar region: Secondary | ICD-10-CM | POA: Diagnosis not present

## 2017-08-12 DIAGNOSIS — M5441 Lumbago with sciatica, right side: Secondary | ICD-10-CM

## 2017-08-12 DIAGNOSIS — M5442 Lumbago with sciatica, left side: Secondary | ICD-10-CM

## 2017-08-12 DIAGNOSIS — G8929 Other chronic pain: Secondary | ICD-10-CM | POA: Insufficient documentation

## 2017-08-12 DIAGNOSIS — M5416 Radiculopathy, lumbar region: Secondary | ICD-10-CM | POA: Insufficient documentation

## 2017-08-12 MED ORDER — LACTATED RINGERS IV SOLN
1000.0000 mL | Freq: Once | INTRAVENOUS | Status: AC
  Administered 2017-08-12: 1000 mL via INTRAVENOUS

## 2017-08-12 MED ORDER — ROPIVACAINE HCL 2 MG/ML IJ SOLN
2.0000 mL | Freq: Once | INTRAMUSCULAR | Status: AC
  Administered 2017-08-12: 10 mL via EPIDURAL
  Filled 2017-08-12: qty 10

## 2017-08-12 MED ORDER — FENTANYL CITRATE (PF) 100 MCG/2ML IJ SOLN
25.0000 ug | INTRAMUSCULAR | Status: DC | PRN
  Administered 2017-08-12: 100 ug via INTRAVENOUS
  Filled 2017-08-12: qty 2

## 2017-08-12 MED ORDER — DEXAMETHASONE SODIUM PHOSPHATE 10 MG/ML IJ SOLN
10.0000 mg | Freq: Once | INTRAMUSCULAR | Status: AC
  Administered 2017-08-12: 10 mg
  Filled 2017-08-12: qty 1

## 2017-08-12 MED ORDER — SODIUM CHLORIDE 0.9% FLUSH
2.0000 mL | Freq: Once | INTRAVENOUS | Status: AC
  Administered 2017-08-12: 10 mL

## 2017-08-12 MED ORDER — SODIUM CHLORIDE 0.9 % IJ SOLN
INTRAMUSCULAR | Status: AC
  Filled 2017-08-12: qty 10

## 2017-08-12 MED ORDER — IOPAMIDOL (ISOVUE-M 200) INJECTION 41%
10.0000 mL | Freq: Once | INTRAMUSCULAR | Status: AC
  Administered 2017-08-12: 10 mL via EPIDURAL
  Filled 2017-08-12: qty 10

## 2017-08-12 MED ORDER — LIDOCAINE HCL (PF) 1 % IJ SOLN
10.0000 mL | Freq: Once | INTRAMUSCULAR | Status: AC
  Administered 2017-08-12: 5 mL
  Filled 2017-08-12: qty 10

## 2017-08-12 NOTE — Patient Instructions (Signed)
Pain Management Discharge Instructions  General Discharge Instructions :  If you need to reach your doctor call: Monday-Friday 8:00 am - 4:00 pm at [phone] or toll free [phone].  After clinic hours [phone] to have operator reach doctor.  Bring all of your medication bottles to all your appointments in the pain clinic.  To cancel or reschedule your appointment with Pain Management please remember to call 24 hours in advance to avoid a fee.  Refer to the educational materials which you have been given on: General Risks, I had my Procedure. Discharge Instructions, Post Sedation.  Post Procedure Instructions:  The drugs you were given will stay in your system until tomorrow, so for the next 24 hours you should not drive, make any legal decisions or drink any alcoholic beverages.  You may eat anything you prefer, but it is better to start with liquids then soups and crackers, and gradually work up to solid foods.  Please notify your doctor immediately if you have any unusual bleeding, trouble breathing or pain that is not related to your normal pain.  Depending on the type of procedure that was done, some parts of your body may feel week and/or numb.  This usually clears up by tonight or the next day.  Walk with the use of an assistive device or accompanied by an adult for the 24 hours.  You may use ice on the affected area for the first 24 hours.  Put ice in a Ziploc bag and cover with a towel and place against area 15 minutes on 15 minutes off.  You may switch to heat after 24 hours.Epidural Steroid Injection Patient Information  Description: The epidural space surrounds the nerves as they exit the spinal cord.  In some patients, the nerves can be compressed and inflamed by a bulging disc or a tight spinal canal (spinal stenosis).  By injecting steroids into the epidural space, we can bring irritated nerves into direct contact with a potentially helpful medication.  These  steroids act directly on the irritated nerves and can reduce swelling and inflammation which often leads to decreased pain.  Epidural steroids may be injected anywhere along the spine and from the neck to the low back depending upon the location of your pain.   After numbing the skin with local anesthetic (like Novocaine), a small needle is passed into the epidural space slowly.  You may experience a sensation of pressure while this is being done.  The entire block usually last less than 10 minutes.  Conditions which may be treated by epidural steroids:   Low back and leg pain  Neck and arm pain  Spinal stenosis  Post-laminectomy syndrome  Herpes zoster (shingles) pain  Pain from compression fractures  Preparation for the injection:  1. Do not eat any solid food or dairy products within 8 hours of your appointment.  2. You may drink clear liquids up to 3 hours before appointment.  Clear liquids include water, black coffee, juice or soda.  No milk or cream please. 3. You may take your regular medication, including pain medications, with a sip of water before your appointment  Diabetics should hold regular insulin (if taken separately) and take 1/2 normal NPH dos the morning of the procedure.  Carry some sugar containing items with you to your appointment. 4. A driver must accompany you and be prepared to drive you home after your procedure.  5. Bring all your current medications with your. 6. An IV may be inserted and   sedation may be given at the discretion of the physician.   7. A blood pressure cuff, EKG and other monitors will often be applied during the procedure.  Some patients may need to have extra oxygen administered for a short period. 8. You will be asked to provide medical information, including your allergies, prior to the procedure.  We must know immediately if you are taking blood thinners (like Coumadin/Warfarin)  Or if you are allergic to IV iodine contrast (dye). We must  know if you could possible be pregnant.  Possible side-effects:  Bleeding from needle site  Infection (rare, may require surgery)  Nerve injury (rare)  Numbness & tingling (temporary)  Difficulty urinating (rare, temporary)  Spinal headache ( a headache worse with upright posture)  Light -headedness (temporary)  Pain at injection site (several days)  Decreased blood pressure (temporary)  Weakness in arm/leg (temporary)  Pressure sensation in back/neck (temporary)  Call if you experience:  Fever/chills associated with headache or increased back/neck pain.  Headache worsened by an upright position.  New onset weakness or numbness of an extremity below the injection site  Hives or difficulty breathing (go to the emergency room)  Inflammation or drainage at the infection site  Severe back/neck pain  Any new symptoms which are concerning to you  Please note:  Although the local anesthetic injected can often make your back or neck feel good for several hours after the injection, the pain will likely return.  It takes 3-7 days for steroids to work in the epidural space.  You may not notice any pain relief for at least that one week.  If effective, we will often do a series of three injections spaced 3-6 weeks apart to maximally decrease your pain.  After the initial series, we generally will wait several months before considering a repeat injection of the same type.  If you have any questions, please call [phone] [facility] 

## 2017-08-12 NOTE — Progress Notes (Signed)
Safety precautions to be maintained throughout the outpatient stay will include: orient to surroundings, keep bed in low position, maintain call bell within reach at all times, provide assistance with transfer out of bed and ambulation.  

## 2017-08-12 NOTE — Progress Notes (Signed)
Patient's Name: John Garrett  MRN: [id number]  Referring Provider: [name], MD  DOB: [birth year]  PCP: Roselee Nova, MD  DOS: 08/12/2017  Note by: [name], MD  Service setting: Ambulatory outpatient  Specialty: Interventional Pain Management  Patient type: Established  Location: [facility] (AMB) Pain Management Facility  Visit type: Interventional Procedure   Primary Reason for Visit: Interventional Pain Management Treatment. CC: Back Pain (lower) and Neck Pain  Procedure:  Anesthesia, Analgesia, Anxiolysis:  Type: Therapeutic Inter-Laminar Epidural Steroid Injection Region: Lumbar Level: L4-5 Level. Laterality: Midline directed towards the right         Type: Local Anesthesia with Moderate (Conscious) Sedation Local Anesthetic: Lidocaine 1% Route: Intravenous (IV) IV Access: Secured Sedation: Meaningful verbal contact was maintained at all times during the procedure  Indication(s): Analgesia and Anxiety   Indications: 1. Lumbar radiculopathy, chronic   2. Chronic bilateral low back pain with bilateral sciatica   3. Lumbar degenerative disc disease    Pain Score: Pre-procedure: 7 /10 Post-procedure: 0-No pain/10  Pre-op Assessment:  John Garrett is a 56 y.o. (year old), male patient, seen today for interventional treatment. He  has a past surgical history that includes Cataract extraction (Right, 2014). [name] has a current medication list which includes the following prescription(s): bupropion, buspirone, citalopram, ibuprofen, oxycodone-acetaminophen, rosuvastatin, tizanidine, alprazolam, and orphenadrine, and the following Facility-Administered Medications: fentanyl. His primarily concern today is the Back Pain (lower) and Neck Pain  Initial Vital Signs: Blood pressure (!) 119/91, pulse 90, temperature 98.5 F (36.9 C), temperature source Oral, resp. rate 20, height 5\' 7"  (1.702 m), weight 243 lb (110.2 kg), SpO2 97 %. BMI: Estimated body mass index is 38.06 kg/m as  calculated from the following:   Height as of this encounter: 5\' 7"  (1.702 m).   Weight as of this encounter: 243 lb (110.2 kg).  Risk Assessment: Allergies: Reviewed. He has No Known Allergies.  Allergy Precautions: None required Coagulopathies: Reviewed. None identified.  Blood-thinner therapy: None at this time Active Infection(s): Reviewed. None identified. John Garrett is afebrile  Site Confirmation: [name] was asked to confirm the procedure and laterality before marking the site Procedure checklist: Completed Consent: Before the procedure and under the influence of no sedative(s), amnesic(s), or anxiolytics, the patient was informed of the treatment options, risks and possible complications. To fulfill our ethical and legal obligations, as recommended by the American Medical Association's Code of Ethics, I have informed the patient of my clinical impression; the nature and purpose of the treatment or procedure; the risks, benefits, and possible complications of the intervention; the alternatives, including doing nothing; the risk(s) and benefit(s) of the alternative treatment(s) or procedure(s); and the risk(s) and benefit(s) of doing nothing. The patient was provided information about the general risks and possible complications associated with the procedure. These may include, but are not limited to: failure to achieve desired goals, infection, bleeding, organ or nerve damage, allergic reactions, paralysis, and death. In addition, the patient was informed of those risks and complications associated to Spine-related procedures, such as failure to decrease pain; infection (i.e.: Meningitis, epidural or intraspinal abscess); bleeding (i.e.: epidural hematoma, subarachnoid hemorrhage, or any other type of intraspinal or peri-dural bleeding); organ or nerve damage (i.e.: Any type of peripheral nerve, nerve root, or spinal cord injury) with subsequent damage to sensory, motor, and/or autonomic  systems, resulting in permanent pain, numbness, and/or weakness of one or several areas of the body; allergic reactions; (i.e.: anaphylactic reaction); and/or death. Furthermore, the  patient was informed of those risks and complications associated with the medications. These include, but are not limited to: allergic reactions (i.e.: anaphylactic or anaphylactoid reaction(s)); adrenal axis suppression; blood sugar elevation that in diabetics may result in ketoacidosis or comma; water retention that in patients with history of congestive heart failure may result in shortness of breath, pulmonary edema, and decompensation with resultant heart failure; weight gain; swelling or edema; medication-induced neural toxicity; particulate matter embolism and blood vessel occlusion with resultant organ, and/or nervous system infarction; and/or aseptic necrosis of one or more joints. Finally, the patient was informed that Medicine is not an exact science; therefore, there is also the possibility of unforeseen or unpredictable risks and/or possible complications that may result in a catastrophic outcome. The patient indicated having understood very clearly. We have given the patient no guarantees and we have made no promises. Enough time was given to the patient to ask questions, all of which were answered to the patient's satisfaction. [name] has indicated that he wanted to continue with the procedure. Attestation: I, the ordering provider, attest that I have discussed with the patient the benefits, risks, side-effects, alternatives, likelihood of achieving goals, and potential problems during recovery for the procedure that I have provided informed consent. Date: 08/12/2017; Time: 10:37 AM  Pre-Procedure Preparation:  Monitoring: As per clinic protocol. Respiration, ETCO2, SpO2, BP, heart rate and rhythm monitor placed and checked for adequate function Safety Precautions: Patient was assessed for positional comfort  and pressure points before starting the procedure. Time-out: I initiated and conducted the "Time-out" before starting the procedure, as per protocol. The patient was asked to participate by confirming the accuracy of the "Time Out" information. Verification of the correct person, site, and procedure were performed and confirmed by me, the nursing staff, and the patient. "Time-out" conducted as per Joint Commission's Universal Protocol (UP.01.01.01). "Time-out" Date & Time: 08/12/2017; 1050 hrs.  Description of Procedure Process:   Position: Prone with head of the table was raised to facilitate breathing. Target Area: The interlaminar space, initially targeting the lower laminar border of the superior vertebral body. Approach: Paramedial approach. Area Prepped: Entire Posterior Lumbar Region Prepping solution: ChloraPrep (2% chlorhexidine gluconate and 70% isopropyl alcohol) Safety Precautions: Aspiration looking for blood return was conducted prior to all injections. At no point did we inject any substances, as a needle was being advanced. No attempts were made at seeking any paresthesias. Safe injection practices and needle disposal techniques used. Medications properly checked for expiration dates. SDV (single dose vial) medications used. Description of the Procedure: Protocol guidelines were followed. The procedure needle was introduced through the skin, ipsilateral to the reported pain, and advanced to the target area. Bone was contacted and the needle walked caudad, until the lamina was cleared. The epidural space was identified using "loss-of-resistance technique" with 2-3 ml of PF-NaCl (0.9% NSS), in a 5cc LOR glass syringe. Vitals:   08/12/17 1109 08/12/17 1119 08/12/17 1129 08/12/17 1139  BP: (!) 143/113 (!) 144/98 (!) 120/97 124/88  Pulse:      Resp: 15 17 15 16   Temp:  (!) 97.3 F (36.3 C)    TempSrc:      SpO2: 98% 97% 97% 96%  Weight:      Height:        Start Time: 1050  hrs. End Time: 1108 hrs. Materials:  Needle(s) Type: Epidural needle Gauge: 17G Length: 3.5-in Medication(s): We administered iopamidol, ropivacaine (PF) 2 mg/mL (0.2%), sodium chloride flush, lidocaine (PF), lactated ringers,  fentaNYL, and dexamethasone. Please see chart orders for dosing details. 8 cc solution: 5 cc preservative-free saline, 2 cc 0.2% ropivacaine, 1 cc of Decadron 10 mg/cc. Imaging Guidance (Spinal):  Type of Imaging Technique: Fluoroscopy Guidance (Spinal) Indication(s): Assistance in needle guidance and placement for procedures requiring needle placement in or near specific anatomical locations not easily accessible without such assistance. Exposure Time: Please see nurses notes. Contrast: Before injecting any contrast, we confirmed that the patient did not have an allergy to iodine, shellfish, or radiological contrast. Once satisfactory needle placement was completed at the desired level, radiological contrast was injected. Contrast injected under live fluoroscopy. No contrast complications. See chart for type and volume of contrast used. Fluoroscopic Guidance: I was personally present during the use of fluoroscopy. "Tunnel Vision Technique" used to obtain the best possible view of the target area. Parallax error corrected before commencing the procedure. "Direction-depth-direction" technique used to introduce the needle under continuous pulsed fluoroscopy. Once target was reached, antero-posterior, oblique, and lateral fluoroscopic projection used confirm needle placement in all planes. Images permanently stored in EMR. Interpretation: I personally interpreted the imaging intraoperatively. Adequate needle placement confirmed in multiple planes. Appropriate spread of contrast into desired area was observed. No evidence of afferent or efferent intravascular uptake. No intrathecal or subarachnoid spread observed. Permanent images saved into the patient's record.  Antibiotic  Prophylaxis:  Indication(s): None identified Antibiotic given: None  Post-operative Assessment:  EBL: None Complications: No immediate post-treatment complications observed by team, or reported by patient. Note: The patient tolerated the entire procedure well. A repeat set of vitals were taken after the procedure and the patient was kept under observation following institutional policy, for this type of procedure. Post-procedural neurological assessment was performed, showing return to baseline, prior to discharge. The patient was provided with post-procedure discharge instructions, including a section on how to identify potential problems. Should any problems arise concerning this procedure, the patient was given instructions to immediately contact us, at any time, without hesitation. In any case, we plan to contact the patient by telephone for a follow-up status report regarding this interventional procedure. Comments:  No additional relevant information. 5 out of 5 strength bilateral lower extremity: Plantar flexion, dorsiflexion, knee flexion, knee extension.  Plan of Care   Imaging Orders     DG C-Arm 1-60 Min-No Report Procedure Orders    No procedure(s) ordered today   Follow-up in 2-3 weeks for postprocedural evaluation.  Patient will have hopefully completed pain psychology evaluation by then.  Afterwards, assuming appropriate, can discuss taking over patient's opioid regimen.  Medications ordered for procedure: Meds ordered this encounter  Medications  . iopamidol (ISOVUE-M) 41 % intrathecal injection 10 mL  . ropivacaine (PF) 2 mg/mL (0.2%) (NAROPIN) injection 2 mL  . sodium chloride flush (NS) 0.9 % injection 2 mL  . lidocaine (PF) (XYLOCAINE) 1 % injection 10 mL  . lactated ringers infusion 1,000 mL  . fentaNYL (SUBLIMAZE) injection 25-100 mcg    Make sure Narcan is available in the pyxis when using this medication. In the event of respiratory depression (RR< 8/min):  Titrate NARCAN (naloxone) in increments of 0.1 to 0.2 mg IV at 2-3 minute intervals, until desired degree of reversal.  . dexamethasone (DECADRON) injection 10 mg   Medications administered: We administered iopamidol, ropivacaine (PF) 2 mg/mL (0.2%), sodium chloride flush, lidocaine (PF), lactated ringers, fentaNYL, and dexamethasone.  See the medical record for exact dosing, route, and time of administration.  This SmartLink is deprecated. Use AVSMEDLIST instead  to display the medication list for a patient. Disposition: Discharge home  Discharge Date & Time: 08/12/2017; 1141 hrs.   Physician-requested Follow-up: Return in about 3 weeks (around 09/02/2017) for Post Procedure Evaluation. Future Appointments  Date Time Provider [place]  09/03/2017 10:00 AM [name], MD [facility] None  10/18/2017 10:40 AM Roselee Nova, MD [facility]   Primary Care Physician: Roselee Nova, MD Location: [facility] Note by: [name], MD Date: 08/12/2017; Time: 11:55 AM  Disclaimer:  Medicine is not an exact science. The only guarantee in medicine is that nothing is guaranteed. It is important to note that the decision to proceed with this intervention was based on the information collected from the patient. The Data and conclusions were drawn from the patient's questionnaire, the interview, and the physical examination. Because the information was provided in large part by the patient, it cannot be guaranteed that it has not been purposely or unconsciously manipulated. Every effort has been made to obtain as much relevant data as possible for this evaluation. It is important to note that the conclusions that lead to this procedure are derived in large part from the available data. Always take into account that the treatment will also be dependent on availability of resources and existing treatment guidelines, considered by other Pain Management Practitioners  as being common knowledge and practice, at the time of the intervention. For Medico-Legal purposes, it is also important to point out that variation in procedural techniques and pharmacological choices are the acceptable norm. The indications, contraindications, technique, and results of the above procedure should only be interpreted and judged by a Board-Certified Interventional Pain Specialist with extensive familiarity and expertise in the same exact procedure and technique.

## 2017-08-15 ENCOUNTER — Other Ambulatory Visit: Payer: Self-pay | Admitting: Family Medicine

## 2017-08-15 DIAGNOSIS — M5416 Radiculopathy, lumbar region: Secondary | ICD-10-CM

## 2017-08-15 NOTE — Telephone Encounter (Signed)
Copied from [place] [phone] [phone]. Topic: Quick Communication - See Telephone Encounter >> Aug 15, 2017  1:10 PM Aurelio Brash B wrote: CRM for notification. See Telephone encounter for:  Refill oxycodone  Dr Manuella Ghazi sent pt to pain clinic, pain clinic Dr told pt Dr Manuella Ghazi would need to refill the med 08/15/17.

## 2017-08-15 NOTE — Telephone Encounter (Signed)
Patient has been referred to pain clinic and all further refills should be by the specialist. He has been advised of my departure from the practice early next year and will need to follow up with pain management provider for subsequent visits

## 2017-08-15 NOTE — Telephone Encounter (Signed)
Oxycodone refill

## 2017-08-19 ENCOUNTER — Telehealth: Payer: Self-pay | Admitting: *Deleted

## 2017-08-19 ENCOUNTER — Ambulatory Visit: Payer: Medicare PPO | Admitting: Student in an Organized Health Care Education/Training Program

## 2017-08-19 ENCOUNTER — Telehealth: Payer: Self-pay

## 2017-08-19 DIAGNOSIS — M5416 Radiculopathy, lumbar region: Secondary | ICD-10-CM

## 2017-08-19 NOTE — Telephone Encounter (Signed)
Dr. Manuella Ghazi stated on 08/15/2017 he would like for the pt get his further refills form the pain clinic due to his departure from the practice. The patient states he received a Therapeutic Inter-Laminar Epidural Steroid Injection on 08/12/2017. Pt states due to having other stuff to do ( but reviewing Dr. Holley Raring notes Im assuming the pt mean he will have to complete pain psychology evaluation and Dr. Holley Raring  Will discuss taking over patient's opioid regimen) he will need to follow-up with DR. Manuella Ghazi about Prescribing one more refill until its completed. I mention to pt that Dr. Manuella Ghazi is out of the office and will not be in until 08/26/2017. Pt started getting little upset with me even after apologizing that Dr. Manuella Ghazi Is out of the office due to a illness; Pt hung up on me.

## 2017-08-19 NOTE — Telephone Encounter (Signed)
Copied from [place] [id number]. Topic: Quick Communication - See Telephone Encounter >> Aug 19, 2017  9:15 AM Cleaster Corin, NT wrote: CRM for notification. See Telephone encounter for:   08/19/17. Pt. Calling said he has called since Thursday to get refill on his oxycodone please call pt. To let him know when available

## 2017-08-19 NOTE — Telephone Encounter (Signed)
Patient notified that Dr Holley Raring was out of the office until 08-26-17.

## 2017-08-19 NOTE — Telephone Encounter (Unsigned)
Copied from [place] [phone]. Topic: Quick Communication >> Aug 19, 2017  9:15 AM Cleaster Corin, NT wrote: CRM for notification. See Telephone encounter for:   08/19/17. Pt. Calling said he has called since Thursday to get refill on his oxycodone please call pt. To let him know when available

## 2017-08-20 MED ORDER — OXYCODONE-ACETAMINOPHEN 5-325 MG PO TABS: 1.0000 | ORAL_TABLET | ORAL | 0 refills | Status: DC | PRN

## 2017-08-20 NOTE — Telephone Encounter (Signed)
Prescription for oxycodone acetaminophen is printed and is ready for pickup

## 2017-08-21 NOTE — Telephone Encounter (Signed)
Informed pt that he can pick up the Rx Monday when Dr. Manuella Ghazi return to sign the RX.

## 2017-09-03 ENCOUNTER — Ambulatory Visit: Payer: Medicare PPO | Admitting: Student in an Organized Health Care Education/Training Program

## 2017-09-17 ENCOUNTER — Encounter: Payer: Self-pay | Admitting: Student in an Organized Health Care Education/Training Program

## 2017-09-17 ENCOUNTER — Ambulatory Visit
Payer: Medicare PPO | Attending: Student in an Organized Health Care Education/Training Program | Admitting: Student in an Organized Health Care Education/Training Program

## 2017-09-17 ENCOUNTER — Other Ambulatory Visit: Payer: Self-pay

## 2017-09-17 VITALS — BP 121/87 | HR 79 | Temp 98.3°F | Resp 18 | Ht 68.0 in | Wt 260.0 lb

## 2017-09-17 DIAGNOSIS — F419 Anxiety disorder, unspecified: Secondary | ICD-10-CM | POA: Insufficient documentation

## 2017-09-17 DIAGNOSIS — E669 Obesity, unspecified: Secondary | ICD-10-CM | POA: Diagnosis not present

## 2017-09-17 DIAGNOSIS — M47816 Spondylosis without myelopathy or radiculopathy, lumbar region: Secondary | ICD-10-CM | POA: Diagnosis not present

## 2017-09-17 DIAGNOSIS — G894 Chronic pain syndrome: Secondary | ICD-10-CM | POA: Insufficient documentation

## 2017-09-17 DIAGNOSIS — E785 Hyperlipidemia, unspecified: Secondary | ICD-10-CM | POA: Insufficient documentation

## 2017-09-17 DIAGNOSIS — Z79891 Long term (current) use of opiate analgesic: Secondary | ICD-10-CM | POA: Insufficient documentation

## 2017-09-17 DIAGNOSIS — M5416 Radiculopathy, lumbar region: Secondary | ICD-10-CM | POA: Diagnosis not present

## 2017-09-17 DIAGNOSIS — M5136 Other intervertebral disc degeneration, lumbar region: Secondary | ICD-10-CM

## 2017-09-17 DIAGNOSIS — M47896 Other spondylosis, lumbar region: Secondary | ICD-10-CM | POA: Diagnosis not present

## 2017-09-17 DIAGNOSIS — F329 Major depressive disorder, single episode, unspecified: Secondary | ICD-10-CM | POA: Insufficient documentation

## 2017-09-17 DIAGNOSIS — F17210 Nicotine dependence, cigarettes, uncomplicated: Secondary | ICD-10-CM | POA: Diagnosis not present

## 2017-09-17 DIAGNOSIS — Z5181 Encounter for therapeutic drug level monitoring: Secondary | ICD-10-CM | POA: Diagnosis not present

## 2017-09-17 DIAGNOSIS — M5116 Intervertebral disc disorders with radiculopathy, lumbar region: Secondary | ICD-10-CM | POA: Insufficient documentation

## 2017-09-17 DIAGNOSIS — Z6839 Body mass index (BMI) 39.0-39.9, adult: Secondary | ICD-10-CM | POA: Insufficient documentation

## 2017-09-17 MED ORDER — OXYCODONE-ACETAMINOPHEN 5-325 MG PO TABS: 1.0000 | ORAL_TABLET | ORAL | 0 refills | Status: DC | PRN

## 2017-09-17 NOTE — Patient Instructions (Addendum)
1. Rx for Oxycodone at current dose for 2 months, SIGN OPIOID CONTRACT 2. Schedule for bilateral L3-L5 medial branch nerve blocks with steroid with sedation  GENERAL RISKS AND COMPLICATIONS  What are the risk, side effects and possible complications? Generally speaking, most procedures are safe.  However, with any procedure there are risks, side effects, and the possibility of complications.  The risks and complications are dependent upon the sites that are lesioned, or the type of nerve block to be performed.  The closer the procedure is to the spine, the more serious the risks are.  Great care is taken when placing the radio frequency needles, block needles or lesioning probes, but sometimes complications can occur. 1. Infection: Any time there is an injection through the skin, there is a risk of infection.  This is why sterile conditions are used for these blocks.  There are four possible types of infection. 1. Localized skin infection. 2. Central Nervous System Infection-This can be in the form of Meningitis, which can be deadly. 3. Epidural Infections-This can be in the form of an epidural abscess, which can cause pressure inside of the spine, causing compression of the spinal cord with subsequent paralysis. This would require an emergency surgery to decompress, and there are no guarantees that the patient would recover from the paralysis. 4. Discitis-This is an infection of the intervertebral discs.  It occurs in about 1% of discography procedures.  It is difficult to treat and it may lead to surgery.        2. Pain: the needles have to go through skin and soft tissues, will cause soreness.       3. Damage to internal structures:  The nerves to be lesioned may be near blood vessels or    other nerves which can be potentially damaged.       4. Bleeding: Bleeding is more common if the patient is taking blood thinners such as  aspirin, Coumadin, Ticiid, Plavix, etc., or if he/she have some genetic  predisposition  such as hemophilia. Bleeding into the spinal canal can cause compression of the spinal  cord with subsequent paralysis.  This would require an emergency surgery to  decompress and there are no guarantees that the patient would recover from the  paralysis.       5. Pneumothorax:  Puncturing of a lung is a possibility, every time a needle is introduced in  the area of the chest or upper back.  Pneumothorax refers to free air around the  collapsed lung(s), inside of the thoracic cavity (chest cavity).  Another two possible  complications related to a similar event would include: Hemothorax and Chylothorax.   These are variations of the Pneumothorax, where instead of air around the collapsed  lung(s), you may have blood or chyle, respectively.       6. Spinal headaches: They may occur with any procedures in the area of the spine.       7. Persistent CSF (Cerebro-Spinal Fluid) leakage: This is a rare problem, but may occur  with prolonged intrathecal or epidural catheters either due to the formation of a fistulous  track or a dural tear.       8. Nerve damage: By working so close to the spinal cord, there is always a possibility of  nerve damage, which could be as serious as a permanent spinal cord injury with  paralysis.       9. Death:  Although rare, severe deadly allergic reactions known as "Anaphylactic  reaction" can occur to any of the medications used.      10. Worsening of the symptoms:  We can always make thing worse.  What are the chances of something like this happening? Chances of any of this occuring are extremely low.  By statistics, you have more of a chance of getting killed in a motor vehicle accident: while driving to the hospital than any of the above occurring .  Nevertheless, you should be aware that they are possibilities.  In general, it is similar to taking a shower.  Everybody knows that you can slip, hit your head and get killed.  Does that mean that you should not  shower again?  Nevertheless always keep in mind that statistics do not mean anything if you happen to be on the wrong side of them.  Even if a procedure has a 1 (one) in a 1,000,000 (million) chance of going wrong, it you happen to be that one..Also, keep in mind that by statistics, you have more of a chance of having something go wrong when taking medications.  Who should not have this procedure? If you are on a blood thinning medication (e.g. Coumadin, Plavix, see list of "Blood Thinners"), or if you have an active infection going on, you should not have the procedure.  If you are taking any blood thinners, please inform your physician.  How should I prepare for this procedure?  Do not eat or drink anything at least six hours prior to the procedure.  Bring a driver with you .  It cannot be a taxi.  Come accompanied by an adult that can drive you back, and that is strong enough to help you if your legs get weak or numb from the local anesthetic.  Take all of your medicines the morning of the procedure with just enough water to swallow them.  If you have diabetes, make sure that you are scheduled to have your procedure done first thing in the morning, whenever possible.  If you have diabetes, take only half of your insulin dose and notify our nurse that you have done so as soon as you arrive at the clinic.  If you are diabetic, but only take blood sugar pills (oral hypoglycemic), then do not take them on the morning of your procedure.  You may take them after you have had the procedure.  Do not take aspirin or any aspirin-containing medications, at least eleven (11) days prior to the procedure.  They may prolong bleeding.  Wear loose fitting clothing that may be easy to take off and that you would not mind if it got stained with Betadine or blood.  Do not wear any jewelry or perfume  Remove any nail coloring.  It will interfere with some of our monitoring equipment.  NOTE: Remember that  this is not meant to be interpreted as a complete list of all possible complications.  Unforeseen problems may occur.  BLOOD THINNERS The following drugs contain aspirin or other products, which can cause increased bleeding during surgery and should not be taken for 2 weeks prior to and 1 week after surgery.  If you should need take something for relief of minor pain, you may take acetaminophen which is found in Tylenol,m Datril, Anacin-3 and Panadol. It is not blood thinner. The products listed below are.  Do not take any of the products listed below in addition to any listed on your instruction sheet.  A.P.C or A.P.C with Codeine Codeine Phosphate Capsules #3  Ibuprofen Ridaura  ABC compound Congesprin Imuran rimadil  Advil Cope Indocin Robaxisal  Alka-Seltzer Effervescent Pain Reliever and Antacid Coricidin or Coricidin-D  Indomethacin Rufen  Alka-Seltzer plus Cold Medicine Cosprin Ketoprofen S-A-C Tablets  Anacin Analgesic Tablets or Capsules Coumadin Korlgesic Salflex  Anacin Extra Strength Analgesic tablets or capsules CP-2 Tablets Lanoril Salicylate  Anaprox Cuprimine Capsules Levenox Salocol  Anexsia-D Dalteparin Magan Salsalate  Anodynos Darvon compound Magnesium Salicylate Sine-off  Ansaid Dasin Capsules Magsal Sodium Salicylate  Anturane Depen Capsules Marnal Soma  APF Arthritis pain formula Dewitt's Pills Measurin Stanback  Argesic Dia-Gesic Meclofenamic Sulfinpyrazone  Arthritis Bayer Timed Release Aspirin Diclofenac Meclomen Sulindac  Arthritis pain formula Anacin Dicumarol Medipren Supac  Analgesic (Safety coated) Arthralgen Diffunasal Mefanamic Suprofen  Arthritis Strength Bufferin Dihydrocodeine Mepro Compound Suprol  Arthropan liquid Dopirydamole Methcarbomol with Aspirin Synalgos  ASA tablets/Enseals Disalcid Micrainin Tagament  Ascriptin Doan's Midol Talwin  Ascriptin A/D Dolene Mobidin Tanderil  Ascriptin Extra Strength Dolobid Moblgesic Ticlid  Ascriptin with Codeine  Doloprin or Doloprin with Codeine Momentum Tolectin  Asperbuf Duoprin Mono-gesic Trendar  Aspergum Duradyne Motrin or Motrin IB Triminicin  Aspirin plain, buffered or enteric coated Durasal Myochrisine Trigesic  Aspirin Suppositories Easprin Nalfon Trillsate  Aspirin with Codeine Ecotrin Regular or Extra Strength Naprosyn Uracel  Atromid-S Efficin Naproxen Ursinus  Auranofin Capsules Elmiron Neocylate Vanquish  Axotal Emagrin Norgesic Verin  Azathioprine Empirin or Empirin with Codeine Normiflo Vitamin E  Azolid Emprazil Nuprin Voltaren  Bayer Aspirin plain, buffered or children's or timed BC Tablets or powders Encaprin Orgaran Warfarin Sodium  Buff-a-Comp Enoxaparin Orudis Zorpin  Buff-a-Comp with Codeine Equegesic Os-Cal-Gesic   Buffaprin Excedrin plain, buffered or Extra Strength Oxalid   Bufferin Arthritis Strength Feldene Oxphenbutazone   Bufferin plain or Extra Strength Feldene Capsules Oxycodone with Aspirin   Bufferin with Codeine Fenoprofen Fenoprofen Pabalate or Pabalate-SF   Buffets II Flogesic Panagesic   Buffinol plain or Extra Strength Florinal or Florinal with Codeine Panwarfarin   Buf-Tabs Flurbiprofen Penicillamine   Butalbital Compound Four-way cold tablets Penicillin   Butazolidin Fragmin Pepto-Bismol   Carbenicillin Geminisyn Percodan   Carna Arthritis Reliever Geopen Persantine   Carprofen Gold's salt Persistin   Chloramphenicol Goody's Phenylbutazone   Chloromycetin Haltrain Piroxlcam   Clmetidine heparin Plaquenil   Cllnoril Hyco-pap Ponstel   Clofibrate Hydroxy chloroquine Propoxyphen         Before stopping any of these medications, be sure to consult the physician who ordered them.  Some, such as Coumadin (Warfarin) are ordered to prevent or treat serious conditions such as "deep thrombosis", "pumonary embolisms", and other heart problems.  The amount of time that you may need off of the medication may also vary with the medication and the reason for which  you were taking it.  If you are taking any of these medications, please make sure you notify your pain physician before you undergo any procedures.         Facet Blocks Patient Information  Description: The facets are joints in the spine between the vertebrae.  Like any joints in the body, facets can become irritated and painful.  Arthritis can also effect the facets.  By injecting steroids and local anesthetic in and around these joints, we can temporarily block the nerve supply to them.  Steroids act directly on irritated nerves and tissues to reduce selling and inflammation which often leads to decreased pain.  Facet blocks may be done anywhere along the spine from the neck to the low back depending upon the  location of your pain.   After numbing the skin with local anesthetic (like Novocaine), a small needle is passed onto the facet joints under x-ray guidance.  You may experience a sensation of pressure while this is being done.  The entire block usually lasts about 15-25 minutes.   Conditions which may be treated by facet blocks:   Low back/buttock pain  Neck/shoulder pain  Certain types of headaches  Preparation for the injection:  1. Do not eat any solid food or dairy products within 8 hours of your appointment. 2. You may drink clear liquid up to 3 hours before appointment.  Clear liquids include water, black coffee, juice or soda.  No milk or cream please. 3. You may take your regular medication, including pain medications, with a sip of water before your appointment.  Diabetics should hold regular insulin (if taken separately) and take 1/2 normal NPH dose the morning of the procedure.  Carry some sugar containing items with you to your appointment. 4. A driver must accompany you and be prepared to drive you home after your procedure. 5. Bring all your current medications with you. 6. An IV may be inserted and sedation may be given at the discretion of the physician. 7. A  blood pressure cuff, EKG and other monitors will often be applied during the procedure.  Some patients may need to have extra oxygen administered for a short period. 8. You will be asked to provide medical information, including your allergies and medications, prior to the procedure.  We must know immediately if you are taking blood thinners (like Coumadin/Warfarin) or if you are allergic to IV iodine contrast (dye).  We must know if you could possible be pregnant.  Possible side-effects:   Bleeding from needle site  Infection (rare, may require surgery)  Nerve injury (rare)  Numbness & tingling (temporary)  Difficulty urinating (rare, temporary)  Spinal headache (a headache worse with upright posture)  Light-headedness (temporary)  Pain at injection site (serveral days)  Decreased blood pressure (rare, temporary)  Weakness in arm/leg (temporary)  Pressure sensation in back/neck (temporary)   Call if you experience:   Fever/chills associated with headache or increased back/neck pain  Headache worsened by an upright position  New onset, weakness or numbness of an extremity below the injection site  Hives or difficulty breathing (go to the emergency room)  Inflammation or drainage at the injection site(s)  Severe back/neck pain greater than usual  New symptoms which are concerning to you  Please note:  Although the local anesthetic injected can often make your back or neck feel good for several hours after the injection, the pain will likely return. It takes 3-7 days for steroids to work.  You may not notice any pain relief for at least one week.  If effective, we will often do a series of 2-3 injections spaced 3-6 weeks apart to maximally decrease your pain.  After the initial series, you may be a candidate for a more permanent nerve block of the facets.  If you have any questions, please call #[phone] [facility]

## 2017-09-17 NOTE — Progress Notes (Signed)
Safety precautions to be maintained throughout the outpatient stay will include: orient to surroundings, keep bed in low position, maintain call bell within reach at all times, provide assistance with transfer out of bed and ambulation.  

## 2017-09-17 NOTE — Progress Notes (Signed)
Patient's Name: John Garrett  MRN: [id number]  Referring Provider: Roselee Nova, MD  DOB: [birth year]  PCP: Roselee Nova, MD  DOS: 09/17/2017  Note by: [name], MD  Service setting: Ambulatory outpatient  Specialty: Interventional Pain Management  Location: [facility] (AMB) Pain Management Facility    Patient type: Established   Primary Reason(s) for Visit: Encounter for prescription drug management & post-procedure evaluation of chronic illness with mild to moderate exacerbation(Level of risk: moderate) CC: Back Pain (lower)  HPI  John Garrett is a 56 y.o. year old, male patient, who comes today for a post-procedure evaluation and medication management. He has Lumbar radiculopathy, chronic; Continuous opioid dependence (Jay); Anxiety; Major depression, chronic; Cervical pain; Chest wall mass; Extreme obesity; Compulsive tobacco user syndrome; Dyslipidemia; Mass of soft tissue; Elevated BP without diagnosis of hypertension; Polypharmacy; Low back pain with sciatica; Hyperlipidemia; and Nocturnal leg cramps on their problem list. His primarily concern today is the Back Pain (lower)  Pain Assessment: Location: Lower Back Radiating: right leg Onset: More than a month ago Duration: Chronic pain Quality: Pressure Severity: 6 /10 (self-reported pain score)  Note: Reported level is inconsistent with clinical observations. Clinically the patient looks like a 3/10             When using our objective Pain Scale, levels between 6 and 10/10 are said to belong in an emergency room, as it progressively worsens from a 6/10, described as severely limiting, requiring emergency care not usually available at an outpatient pain management facility. At a 6/10 level, communication becomes difficult and requires great effort. Assistance to reach the emergency department may be required. Facial flushing and profuse sweating along with potentially dangerous increases in heart rate and blood pressure will be  evident. Effect on ADL:   Timing: Constant Modifying factors: rest, hot shower, Tiger Balm  Mr. [name] was last seen on 08/12/2017 for a procedure. During today's appointment we reviewed Mr. [name] post-procedure results, as well as his outpatient medication regimen.  Further details on both, my assessment(s), as well as the proposed treatment plan, please see below.  Controlled Substance Pharmacotherapy Assessment REMS (Risk Evaluation and Mitigation Strategy)  Analgesic: Oxycodone 5 mg up to 6 times a day as needed for chronic pain. MME/day: Approximately 45-50 mg/day.  Landis Martins, RN  09/17/2017 10:44 AM  Sign at close encounter Safety precautions to be maintained throughout the outpatient stay will include: orient to surroundings, keep bed in low position, maintain call bell within reach at all times, provide assistance with transfer out of bed and ambulation.    Pharmacokinetics: Liberation and absorption (onset of action): WNL Distribution (time to peak effect): WNL Metabolism and excretion (duration of action): WNL         Pharmacodynamics: Desired effects: Analgesia: Mr. Downum reports >50% benefit. Functional ability: Patient reports that medication allows him to accomplish basic ADLs Clinically meaningful improvement in function (CMIF): Sustained CMIF goals met Perceived effectiveness: Described as relatively effective but with some room for improvement Undesirable effects: Side-effects or Adverse reactions: None reported Monitoring: [place] PMP: Online review of the past 71-monthperiod conducted. Compliant with practice rules and regulations Last UDS on record: Summary  Date Value Ref Range Status  07/30/2017 FINAL  Final    Comment:    ==================================================================== [facility]RE COMP DRUG ANALYSIS,UR ==================================================================== Test  Result       Flag        Units Drug Present and Declared for Prescription Verification   Oxycodone                      708          EXPECTED   ng/mg creat   Noroxycodone                   803          EXPECTED   ng/mg creat    Sources of oxycodone include scheduled prescription medications.    Noroxycodone is an expected metabolite of oxycodone.   Tizanidine                     PRESENT      EXPECTED   Bupropion                      PRESENT      EXPECTED   Hydroxybupropion               PRESENT      EXPECTED    Hydroxybupropion is an expected metabolite of bupropion.   Citalopram                     PRESENT      EXPECTED   Desmethylcitalopram            PRESENT      EXPECTED    Desmethylcitalopram is an expected metabolite of citalopram or    the enantiomeric form, escitalopram.   Acetaminophen                  PRESENT      EXPECTED   Ibuprofen                      PRESENT      EXPECTED Drug Absent but Declared for Prescription Verification   Alprazolam                     Not Detected UNEXPECTED ng/mg creat   Orphenadrine                   Not Detected UNEXPECTED ==================================================================== Test                      Result    Flag   Units      Ref Range   Creatinine              214              mg/dL      >=20 ==================================================================== Declared Medications:  The flagging and interpretation on this report are based on the  following declared medications.  Unexpected results may arise from  inaccuracies in the declared medications.  **Note: The testing scope of this panel includes these medications:  Alprazolam  Bupropion  Citalopram  Orphenadrine (Norflex)  Oxycodone (Oxycodone Acetaminophen)  **Note: The testing scope of this panel does not include small to  moderate amounts of these reported medications:  Acetaminophen (Oxycodone Acetaminophen)  Ibuprofen  Tizanidine  **Note: The testing scope of this panel does not  include following  reported medications:  Buspirone  Rosuvastatin (Crestor) ==================================================================== For clinical consultation, please call [phone]. ====================================================================    UDS interpretation: Compliant  Medication Assessment Form: Reviewed. Patient indicates being compliant with therapy Treatment compliance: Compliant Risk Assessment Profile: Aberrant behavior: See prior evaluations. None observed or detected today Comorbid factors increasing risk of overdose: See prior notes. No additional risks detected today Risk of substance use disorder (SUD): Low patient states that he is scheduled to see pain psychologist this week. Opioid Risk Tool - 08/12/17 1000      Family History of Substance Abuse   Alcohol  Negative    Illegal Drugs  Negative    Rx Drugs  Negative      Personal History of Substance Abuse   Alcohol  Negative    Illegal Drugs  Negative    Rx Drugs  Negative      Age   Age between 60-45 years   No      History of Preadolescent Sexual Abuse   History of Preadolescent Sexual Abuse  Negative or Male      Psychological Disease   Psychological Disease  Negative    Depression  Negative      Total Score   Opioid Risk Tool Scoring  0    Opioid Risk Interpretation  Low Risk      ORT Scoring interpretation table:  Score <3 = Low Risk for SUD  Score between 4-7 = Moderate Risk for SUD  Score >8 = High Risk for Opioid Abuse   Risk Mitigation Strategies:  Patient Counseling: Completed today. Counseling provided to patient as per "Patient Counseling Document". Document signed by patient, attesting to counseling and understanding Patient-Prescriber Agreement (PPA): Obtained today  Notification to other healthcare providers: Written and sent today  Pharmacologic Plan: Today we will take over the chronic pain medication management and from this point on our  medication agreement with this patient is active  Post-Procedure Assessment  08/12/2017 Procedure: Lumbar epidural steroid injection Pre-procedure pain score:  7/10 Post-procedure pain score: 0/10         Influential Factors: BMI: 39.53 kg/m Intra-procedural challenges: None observed.         Assessment challenges: None detected.              Reported side-effects: None.        Post-procedural adverse reactions or complications: None reported         Sedation: Please see nurses note. When no sedatives are used, the analgesic levels obtained are directly associated to the effectiveness of the local anesthetics. However, when sedation is provided, the level of analgesia obtained during the initial 1 hour following the intervention, is believed to be the result of a combination of factors. These factors may include, but are not limited to: 1. The effectiveness of the local anesthetics used. 2. The effects of the analgesic(s) and/or anxiolytic(s) used. 3. The degree of discomfort experienced by the patient at the time of the procedure. 4. The patients ability and reliability in recalling and recording the events. 5. The presence and influence of possible secondary gains and/or psychosocial factors. Reported result: Relief experienced during the 1st hour after the procedure: 20 % (Ultra-Short Term Relief)            Interpretative annotation: Clinically appropriate result. Analgesia during this period is likely to be Local Anesthetic and/or IV Sedative (Analgesic/Anxiolytic) related.          Effects of local anesthetic: The analgesic effects attained during this period are directly associated to the localized infiltration of local anesthetics and therefore cary significant diagnostic value as to the etiological location, or  anatomical origin, of the pain. Expected duration of relief is directly dependent on the pharmacodynamics of the local anesthetic used. Long-acting (4-6 hours) anesthetics  used.  Reported result: Relief during the next 4 to 6 hour after the procedure: 45 % (Short-Term Relief)            Interpretative annotation: Clinically appropriate result. Analgesia during this period is likely to be Local Anesthetic-related.          Long-term benefit: Defined as the period of time past the expected duration of local anesthetics (1 hour for short-acting and 4-6 hours for long-acting). With the possible exception of prolonged sympathetic blockade from the local anesthetics, benefits during this period are typically attributed to, or associated with, other factors such as analgesic sensory neuropraxia, antiinflammatory effects, or beneficial biochemical changes provided by agents other than the local anesthetics.  Reported result: Extended relief following procedure: 20 %(relief in leg cramps, but not in lower back) (Long-Term Relief)            Interpretative annotation: Clinically appropriate result. Good relief. No permanent benefit expected. Inflammation plays a part in the etiology to the pain.          Current benefits: Defined as reported results that persistent at this point in time.   Analgesia: 0-25 %            Function: Somewhat improved ROM: Somewhat improved Interpretative annotation: Recurrence of symptoms. Therapeutic benefit observed. Effective diagnostic intervention.         Patient states that leg cramps and radicular leg pain is better but that axial low back pain is still there.  Interpretation: Results would suggest a successful diagnostic and therapeutic intervention.                  Plan:  Please see "Plan of Care" for details.         Meds   Current Outpatient Medications:  .  buPROPion (WELLBUTRIN XL) 300 MG 24 hr tablet, Take 1 tablet (300 mg total) by mouth daily., Disp: 90 tablet, Rfl: 0 .  busPIRone (BUSPAR) 15 MG tablet, Take 1 tablet (15 mg total) by mouth 2 (two) times daily., Disp: 180 tablet, Rfl: 0 .  citalopram (CELEXA) 40 MG tablet,  Take 1 tablet (40 mg total) by mouth daily., Disp: 90 tablet, Rfl: 0 .  ibuprofen (ADVIL,MOTRIN) 800 MG tablet, Take 1 tablet (800 mg total) by mouth daily as needed., Disp: 90 tablet, Rfl: 1 .  oxyCODONE-acetaminophen (PERCOCET/ROXICET) 5-325 MG tablet, Take 1 tablet by mouth every 4 (four) hours as needed for severe pain. For chronic pain DNF: 09/21/17, 10/21/17 To last for 30 day from fill date, Disp: 180 tablet, Rfl: 0 .  rosuvastatin (CRESTOR) 20 MG tablet, Take 1 tablet (20 mg total) by mouth daily., Disp: 90 tablet, Rfl: 0 .  tiZANidine (ZANAFLEX) 4 MG tablet, Take 1 tablet (4 mg total) by mouth 2 (two) times daily as needed for muscle spasms., Disp: 60 tablet, Rfl: 1 .  orphenadrine (NORFLEX) 100 MG tablet, take 1 tablet by mouth twice a day if needed for muscle spasm, Disp: , Rfl: 0  ROS  Constitutional: Denies any fever or chills Gastrointestinal: No reported hemesis, hematochezia, vomiting, or acute GI distress Musculoskeletal: Denies any acute onset joint swelling, redness, loss of ROM, or weakness Neurological: No reported episodes of acute onset apraxia, aphasia, dysarthria, agnosia, amnesia, paralysis, loss of coordination, or loss of consciousness  Allergies  Mr. Wise has No Known  Allergies.  [place]  Drug: Mr. Thoman  reports that he does not use drugs. Alcohol:  reports that he drinks about 7.2 oz of alcohol per week. Tobacco:  reports that he has been smoking e-cigarettes.  He has been smoking about 0.00 packs per day. He has quit using smokeless tobacco. His smokeless tobacco use included chew. Medical:  has a past medical history of Anxiety, Chronic back pain, Chronic pain, Depression, and Hyperlipidemia. Surgical: Mr. Pfiester  has a past surgical history that includes Cataract extraction (Right, 2014). Family: He was adopted. Family history is unknown by patient.  Constitutional Exam  General appearance: Well nourished, well developed, and well hydrated. In no apparent acute  distress Vitals:   09/17/17 1044  BP: 121/87  Pulse: 79  Resp: 18  Temp: 98.3 F (36.8 C)  TempSrc: Oral  SpO2: 100%  Weight: 260 lb (117.9 kg)  Height: _0  (1.727 m)   BMI Assessment: Estimated body mass index is 39.53 kg/m as calculated from the following:   Height as of this encounter: _1  (1.727 m).   Weight as of this encounter: 260 lb (117.9 kg).  BMI interpretation table: BMI level Category Range association with higher incidence of chronic pain  <18 kg/m2 Underweight   18.5-24.9 kg/m2 Ideal body weight   25-29.9 kg/m2 Overweight Increased incidence by 20%  30-34.9 kg/m2 Obese (Class I) Increased incidence by 68%  35-39.9 kg/m2 Severe obesity (Class II) Increased incidence by 136%  >40 kg/m2 Extreme obesity (Class III) Increased incidence by 254%   BMI Readings from Last 4 Encounters:  09/17/17 39.53 kg/m  08/12/17 38.06 kg/m  07/30/17 39.99 kg/m  07/18/17 43.96 kg/m   Wt Readings from Last 4 Encounters:  09/17/17 260 lb (117.9 kg)  08/12/17 243 lb (110.2 kg)  07/30/17 263 lb (119.3 kg)  07/18/17 256 lb 1.6 oz (116.2 kg)  Psych/Mental status: Alert, oriented x 3 (person, place, & time)       Eyes: PERLA Respiratory: No evidence of acute respiratory distress  Cervical Spine Area Exam  Skin & Axial Inspection: No masses, redness, edema, swelling, or associated skin lesions Alignment: Symmetrical Functional ROM: Unrestricted ROM      Stability: No instability detected Muscle Tone/Strength: Functionally intact. No obvious neuro-muscular anomalies detected. Sensory (Neurological): Unimpaired Palpation: No palpable anomalies              Upper Extremity (UE) Exam    Side: Right upper extremity  Side: Left upper extremity  Skin & Extremity Inspection: Skin color, temperature, and hair growth are WNL. No peripheral edema or cyanosis. No masses, redness, swelling, asymmetry, or associated skin lesions. No contractures.  Skin & Extremity Inspection: Skin  color, temperature, and hair growth are WNL. No peripheral edema or cyanosis. No masses, redness, swelling, asymmetry, or associated skin lesions. No contractures.  Functional ROM: Unrestricted ROM          Functional ROM: Unrestricted ROM          Muscle Tone/Strength: Functionally intact. No obvious neuro-muscular anomalies detected.  Muscle Tone/Strength: Functionally intact. No obvious neuro-muscular anomalies detected.  Sensory (Neurological): Unimpaired          Sensory (Neurological): Unimpaired          Palpation: No palpable anomalies              Palpation: No palpable anomalies              Specialized Test(s): Deferred  Specialized Test(s): Deferred          Thoracic Spine Area Exam  Skin & Axial Inspection: No masses, redness, or swelling Alignment: Symmetrical Functional ROM: Unrestricted ROM Stability: No instability detected Muscle Tone/Strength: Functionally intact. No obvious neuro-muscular anomalies detected. Sensory (Neurological): Unimpaired Muscle strength & Tone: No palpable anomalies  Lumbar Spine Area Exam  Skin & Axial Inspection: No masses, redness, or swelling Alignment: Symmetrical Functional ROM: Decreased ROM      Stability: No instability detected Muscle Tone/Strength: Functionally intact. No obvious neuro-muscular anomalies detected. Sensory (Neurological): Articular pain pattern Palpation: Complains of area being tender to palpation Bilateral Fist Percussion Test Provocative Tests: Lumbar Hyperextension and rotation test: Positive bilaterally for facet joint pain. Lumbar Lateral bending test: Positive ipsilateral radicular pain, bilaterally. Positive for bilateral foraminal stenosis. Patrick's Maneuver: Positive for bilateral S-I arthralgia              Gait & Posture Assessment  Ambulation: Unassisted Gait: Antalgic Posture: WNL   Lower Extremity Exam    Side: Right lower extremity  Side: Left lower extremity  Skin & Extremity Inspection:  Skin color, temperature, and hair growth are WNL. No peripheral edema or cyanosis. No masses, redness, swelling, asymmetry, or associated skin lesions. No contractures.  Skin & Extremity Inspection: Skin color, temperature, and hair growth are WNL. No peripheral edema or cyanosis. No masses, redness, swelling, asymmetry, or associated skin lesions. No contractures.  Functional ROM: Unrestricted ROM          Functional ROM: Unrestricted ROM          Muscle Tone/Strength: Functionally intact. No obvious neuro-muscular anomalies detected.  Muscle Tone/Strength: Functionally intact. No obvious neuro-muscular anomalies detected.  Sensory (Neurological): Unimpaired  Sensory (Neurological): Unimpaired  Palpation: No palpable anomalies  Palpation: No palpable anomalies   Assessment  Primary Diagnosis & Pertinent Problem List: The primary encounter diagnosis was Lumbar spondylosis. Diagnoses of Lumbar degenerative disc disease, Lumbar radiculopathy, chronic, and Chronic pain syndrome were also pertinent to this visit.  Status Diagnosis  Controlled Controlled Controlled 1. Lumbar spondylosis   2. Lumbar degenerative disc disease   3. Lumbar radiculopathy, chronic   4. Chronic pain syndrome      56 year old male with chronic pain secondary to lumbar spondylosis, lumbar degenerative disc disease, lumbar radiculopathy which is chronic status post lumbar epidural steroid injection which is significantly improved the patient's lower extremity cramps and radiating lower externally pain.  Patient continues to endorse axial low back pain that has not gotten significantly better after the lumbar epidural steroid injection.  I explained to the patient that the epidural injection was for his radicular symptoms primarily his leg pain.  Patient does have pain with facet loading and lateral rotation suggesting a facet referral pattern.  We discussed bilateral L3-L5 lumbar medial branch nerve blocks with steroid for his  axial low back pain and possible radiofrequency ablation if it is effective.  Risks and benefits were discussed.  Patient would like to proceed.  In regards to medication management, I have encouraged the patient to see the psychologist as originally planned.  Patient states that he had an appointment last week but that due to the snow he was unable to make it.  He hopes to follow-up with them this week.  Patient's urine drug screen was reviewed and appropriate.  We will have the patient complete an opioid agreement with our clinic and take over his previous opioid therapy as prescribed by Dr. Manuella Ghazi.  No further dose escalation  beyond this.  Prescription provided for 2 months.  Plan: -Sign opiate agreement -Prescription for Percocet at previous dose below. -Bilateral L3/4, L4/5, L5/S1 facet medial branch nerve block with steroid.   Plan of Care  Pharmacotherapy (Medications Ordered): Meds ordered this encounter  Medications  . DISCONTD: oxyCODONE-acetaminophen (PERCOCET/ROXICET) 5-325 MG tablet    Sig: Take 1 tablet by mouth every 4 (four) hours as needed for severe pain. For chronic pain DNF: 09/21/17, 10/21/17 To last for 30 day from fill date    Dispense:  180 tablet    Refill:  0  . oxyCODONE-acetaminophen (PERCOCET/ROXICET) 5-325 MG tablet    Sig: Take 1 tablet by mouth every 4 (four) hours as needed for severe pain. For chronic pain DNF: 09/21/17, 10/21/17 To last for 30 day from fill date    Dispense:  180 tablet    Refill:  0   Lab-work, procedure(s), and/or referral(s): Orders Placed This Encounter  Procedures  . LUMBAR FACET(MEDIAL BRANCH NERVE BLOCK) MBNB    Provider-requested follow-up: Return in about 8 weeks (around 11/12/2017) for Medication Management.  Future Appointments  Date Time Provider [place]  10/18/2017 10:40 AM Roselee Nova, MD [facility]  11/07/2017 10:30 AM [name], MD [facility] None   Time Note: Greater than 50% of the 25 minute(s)  of face-to-face time spent with Mr. Martelli, was spent in counseling/coordination of care regarding: the appropriate use of the pain scale, opioid tolerance, the treatment plan, treatment alternatives, medication side effects, going over the informed consent and the opioid analgesic risks and possible complications.  Primary Care Physician: Roselee Nova, MD Location: [facility] Note by: [name], M.D Date: 09/17/2017; Time: 4:19 PM  Patient Instructions   1. Rx for Oxycodone at current dose for 2 months, SIGN OPIOID CONTRACT 2. Schedule for bilateral L3-L5 medial branch nerve blocks with steroid with sedation  GENERAL RISKS AND COMPLICATIONS  What are the risk, side effects and possible complications? Generally speaking, most procedures are safe.  However, with any procedure there are risks, side effects, and the possibility of complications.  The risks and complications are dependent upon the sites that are lesioned, or the type of nerve block to be performed.  The closer the procedure is to the spine, the more serious the risks are.  Great care is taken when placing the radio frequency needles, block needles or lesioning probes, but sometimes complications can occur. 1. Infection: Any time there is an injection through the skin, there is a risk of infection.  This is why sterile conditions are used for these blocks.  There are four possible types of infection. 1. Localized skin infection. 2. Central Nervous System Infection-This can be in the form of Meningitis, which can be deadly. 3. Epidural Infections-This can be in the form of an epidural abscess, which can cause pressure inside of the spine, causing compression of the spinal cord with subsequent paralysis. This would require an emergency surgery to decompress, and there are no guarantees that the patient would recover from the paralysis. 4. Discitis-This is an infection of the intervertebral discs.  It  occurs in about 1% of discography procedures.  It is difficult to treat and it may lead to surgery.        2. Pain: the needles have to go through skin and soft tissues, will cause soreness.       3. Damage to internal structures:  The nerves to be lesioned may be near  blood vessels or    other nerves which can be potentially damaged.       4. Bleeding: Bleeding is more common if the patient is taking blood thinners such as  aspirin, Coumadin, Ticiid, Plavix, etc., or if he/she have some genetic predisposition  such as hemophilia. Bleeding into the spinal canal can cause compression of the spinal  cord with subsequent paralysis.  This would require an emergency surgery to  decompress and there are no guarantees that the patient would recover from the  paralysis.       5. Pneumothorax:  Puncturing of a lung is a possibility, every time a needle is introduced in  the area of the chest or upper back.  Pneumothorax refers to free air around the  collapsed lung(s), inside of the thoracic cavity (chest cavity).  Another two possible  complications related to a similar event would include: Hemothorax and Chylothorax.   These are variations of the Pneumothorax, where instead of air around the collapsed  lung(s), you may have blood or chyle, respectively.       6. Spinal headaches: They may occur with any procedures in the area of the spine.       7. Persistent CSF (Cerebro-Spinal Fluid) leakage: This is a rare problem, but may occur  with prolonged intrathecal or epidural catheters either due to the formation of a fistulous  track or a dural tear.       8. Nerve damage: By working so close to the spinal cord, there is always a possibility of  nerve damage, which could be as serious as a permanent spinal cord injury with  paralysis.       9. Death:  Although rare, severe deadly allergic reactions known as "Anaphylactic  reaction" can occur to any of the medications used.      10. Worsening of the symptoms:  We can  always make thing worse.  What are the chances of something like this happening? Chances of any of this occuring are extremely low.  By statistics, you have more of a chance of getting killed in a motor vehicle accident: while driving to the hospital than any of the above occurring .  Nevertheless, you should be aware that they are possibilities.  In general, it is similar to taking a shower.  Everybody knows that you can slip, hit your head and get killed.  Does that mean that you should not shower again?  Nevertheless always keep in mind that statistics do not mean anything if you happen to be on the wrong side of them.  Even if a procedure has a 1 (one) in a 1,000,000 (million) chance of going wrong, it you happen to be that one..Also, keep in mind that by statistics, you have more of a chance of having something go wrong when taking medications.  Who should not have this procedure? If you are on a blood thinning medication (e.g. Coumadin, Plavix, see list of "Blood Thinners"), or if you have an active infection going on, you should not have the procedure.  If you are taking any blood thinners, please inform your physician.  How should I prepare for this procedure?  Do not eat or drink anything at least six hours prior to the procedure.  Bring a driver with you .  It cannot be a taxi.  Come accompanied by an adult that can drive you back, and that is strong enough to help you if your legs get weak or numb from  the local anesthetic.  Take all of your medicines the morning of the procedure with just enough water to swallow them.  If you have diabetes, make sure that you are scheduled to have your procedure done first thing in the morning, whenever possible.  If you have diabetes, take only half of your insulin dose and notify our nurse that you have done so as soon as you arrive at the clinic.  If you are diabetic, but only take blood sugar pills (oral hypoglycemic), then do not take them on  the morning of your procedure.  You may take them after you have had the procedure.  Do not take aspirin or any aspirin-containing medications, at least eleven (11) days prior to the procedure.  They may prolong bleeding.  Wear loose fitting clothing that may be easy to take off and that you would not mind if it got stained with Betadine or blood.  Do not wear any jewelry or perfume  Remove any nail coloring.  It will interfere with some of our monitoring equipment.  NOTE: Remember that this is not meant to be interpreted as a complete list of all possible complications.  Unforeseen problems may occur.  BLOOD THINNERS The following drugs contain aspirin or other products, which can cause increased bleeding during surgery and should not be taken for 2 weeks prior to and 1 week after surgery.  If you should need take something for relief of minor pain, you may take acetaminophen which is found in Tylenol,m Datril, Anacin-3 and Panadol. It is not blood thinner. The products listed below are.  Do not take any of the products listed below in addition to any listed on your instruction sheet.  A.P.C or A.P.C with Codeine Codeine Phosphate Capsules #3 Ibuprofen Ridaura  ABC compound Congesprin Imuran rimadil  Advil Cope Indocin Robaxisal  Alka-Seltzer Effervescent Pain Reliever and Antacid Coricidin or Coricidin-D  Indomethacin Rufen  Alka-Seltzer plus Cold Medicine Cosprin Ketoprofen S-A-C Tablets  Anacin Analgesic Tablets or Capsules Coumadin Korlgesic Salflex  Anacin Extra Strength Analgesic tablets or capsules CP-2 Tablets Lanoril Salicylate  Anaprox Cuprimine Capsules Levenox Salocol  Anexsia-D Dalteparin Magan Salsalate  Anodynos Darvon compound Magnesium Salicylate Sine-off  Ansaid Dasin Capsules Magsal Sodium Salicylate  Anturane Depen Capsules Marnal Soma  APF Arthritis pain formula Dewitt's Pills Measurin Stanback  Argesic Dia-Gesic Meclofenamic Sulfinpyrazone  Arthritis Bayer Timed  Release Aspirin Diclofenac Meclomen Sulindac  Arthritis pain formula Anacin Dicumarol Medipren Supac  Analgesic (Safety coated) Arthralgen Diffunasal Mefanamic Suprofen  Arthritis Strength Bufferin Dihydrocodeine Mepro Compound Suprol  Arthropan liquid Dopirydamole Methcarbomol with Aspirin Synalgos  ASA tablets/Enseals Disalcid Micrainin Tagament  Ascriptin Doan's Midol Talwin  Ascriptin A/D Dolene Mobidin Tanderil  Ascriptin Extra Strength Dolobid Moblgesic Ticlid  Ascriptin with Codeine Doloprin or Doloprin with Codeine Momentum Tolectin  Asperbuf Duoprin Mono-gesic Trendar  Aspergum Duradyne Motrin or Motrin IB Triminicin  Aspirin plain, buffered or enteric coated Durasal Myochrisine Trigesic  Aspirin Suppositories Easprin Nalfon Trillsate  Aspirin with Codeine Ecotrin Regular or Extra Strength Naprosyn Uracel  Atromid-S Efficin Naproxen Ursinus  Auranofin Capsules Elmiron Neocylate Vanquish  Axotal Emagrin Norgesic Verin  Azathioprine Empirin or Empirin with Codeine Normiflo Vitamin E  Azolid Emprazil Nuprin Voltaren  Bayer Aspirin plain, buffered or children's or timed BC Tablets or powders Encaprin Orgaran Warfarin Sodium  Buff-a-Comp Enoxaparin Orudis Zorpin  Buff-a-Comp with Codeine Equegesic Os-Cal-Gesic   Buffaprin Excedrin plain, buffered or Extra Strength Oxalid   Bufferin Arthritis Strength Feldene Oxphenbutazone   Bufferin plain  or Extra Strength Feldene Capsules Oxycodone with Aspirin   Bufferin with Codeine Fenoprofen Fenoprofen Pabalate or Pabalate-SF   Buffets II Flogesic Panagesic   Buffinol plain or Extra Strength Florinal or Florinal with Codeine Panwarfarin   Buf-Tabs Flurbiprofen Penicillamine   Butalbital Compound Four-way cold tablets Penicillin   Butazolidin Fragmin Pepto-Bismol   Carbenicillin Geminisyn Percodan   Carna Arthritis Reliever Geopen Persantine   Carprofen Gold's salt Persistin   Chloramphenicol Goody's Phenylbutazone   Chloromycetin  Haltrain Piroxlcam   Clmetidine heparin Plaquenil   Cllnoril Hyco-pap Ponstel   Clofibrate Hydroxy chloroquine Propoxyphen         Before stopping any of these medications, be sure to consult the physician who ordered them.  Some, such as Coumadin (Warfarin) are ordered to prevent or treat serious conditions such as "deep thrombosis", "pumonary embolisms", and other heart problems.  The amount of time that you may need off of the medication may also vary with the medication and the reason for which you were taking it.  If you are taking any of these medications, please make sure you notify your pain physician before you undergo any procedures.         Facet Blocks Patient Information  Description: The facets are joints in the spine between the vertebrae.  Like any joints in the body, facets can become irritated and painful.  Arthritis can also effect the facets.  By injecting steroids and local anesthetic in and around these joints, we can temporarily block the nerve supply to them.  Steroids act directly on irritated nerves and tissues to reduce selling and inflammation which often leads to decreased pain.  Facet blocks may be done anywhere along the spine from the neck to the low back depending upon the location of your pain.   After numbing the skin with local anesthetic (like Novocaine), a small needle is passed onto the facet joints under x-ray guidance.  You may experience a sensation of pressure while this is being done.  The entire block usually lasts about 15-25 minutes.   Conditions which may be treated by facet blocks:   Low back/buttock pain  Neck/shoulder pain  Certain types of headaches  Preparation for the injection:  1. Do not eat any solid food or dairy products within 8 hours of your appointment. 2. You may drink clear liquid up to 3 hours before appointment.  Clear liquids include water, black coffee, juice or soda.  No milk or cream please. 3. You may take your  regular medication, including pain medications, with a sip of water before your appointment.  Diabetics should hold regular insulin (if taken separately) and take 1/2 normal NPH dose the morning of the procedure.  Carry some sugar containing items with you to your appointment. 4. A driver must accompany you and be prepared to drive you home after your procedure. 5. Bring all your current medications with you. 6. An IV may be inserted and sedation may be given at the discretion of the physician. 7. A blood pressure cuff, EKG and other monitors will often be applied during the procedure.  Some patients may need to have extra oxygen administered for a short period. 8. You will be asked to provide medical information, including your allergies and medications, prior to the procedure.  We must know immediately if you are taking blood thinners (like Coumadin/Warfarin) or if you are allergic to IV iodine contrast (dye).  We must know if you could possible be pregnant.  Possible side-effects:  Bleeding from needle site  Infection (rare, may require surgery)  Nerve injury (rare)  Numbness & tingling (temporary)  Difficulty urinating (rare, temporary)  Spinal headache (a headache worse with upright posture)  Light-headedness (temporary)  Pain at injection site (serveral days)  Decreased blood pressure (rare, temporary)  Weakness in arm/leg (temporary)  Pressure sensation in back/neck (temporary)   Call if you experience:   Fever/chills associated with headache or increased back/neck pain  Headache worsened by an upright position  New onset, weakness or numbness of an extremity below the injection site  Hives or difficulty breathing (go to the emergency room)  Inflammation or drainage at the injection site(s)  Severe back/neck pain greater than usual  New symptoms which are concerning to you  Please note:  Although the local anesthetic injected can often make your back or  neck feel good for several hours after the injection, the pain will likely return. It takes 3-7 days for steroids to work.  You may not notice any pain relief for at least one week.  If effective, we will often do a series of 2-3 injections spaced 3-6 weeks apart to maximally decrease your pain.  After the initial series, you may be a candidate for a more permanent nerve block of the facets.  If you have any questions, please call #[phone]) [facility]

## 2017-10-18 ENCOUNTER — Ambulatory Visit: Payer: Medicare PPO | Admitting: Family Medicine

## 2017-10-18 ENCOUNTER — Encounter: Payer: Self-pay | Admitting: Family Medicine

## 2017-10-18 DIAGNOSIS — F419 Anxiety disorder, unspecified: Secondary | ICD-10-CM

## 2017-10-18 DIAGNOSIS — E782 Mixed hyperlipidemia: Secondary | ICD-10-CM | POA: Diagnosis not present

## 2017-10-18 DIAGNOSIS — F341 Dysthymic disorder: Secondary | ICD-10-CM

## 2017-10-18 DIAGNOSIS — F329 Major depressive disorder, single episode, unspecified: Secondary | ICD-10-CM

## 2017-10-18 MED ORDER — BUPROPION HCL ER (XL) 300 MG PO TB24: 300.0000 mg | ORAL_TABLET | Freq: Every day | ORAL | 0 refills | Status: DC

## 2017-10-18 MED ORDER — ROSUVASTATIN CALCIUM 20 MG PO TABS: 20.0000 mg | ORAL_TABLET | Freq: Every day | ORAL | 0 refills | Status: DC

## 2017-10-18 MED ORDER — CITALOPRAM HYDROBROMIDE 40 MG PO TABS: 40.0000 mg | ORAL_TABLET | Freq: Every day | ORAL | 0 refills | Status: DC

## 2017-10-18 MED ORDER — BUSPIRONE HCL 15 MG PO TABS: 15.0000 mg | ORAL_TABLET | Freq: Two times a day (BID) | ORAL | 0 refills | Status: DC

## 2017-10-18 NOTE — Progress Notes (Signed)
Name: John Garrett   MRN: [id number]    DOB: 1961/01/18   Date:10/18/2017       Progress Note  Subjective  Chief Complaint  Chief Complaint  Patient presents with  . Hyperlipidemia  . Anxiety  . Depression  . Medication Refill    Hyperlipidemia  This is a chronic problem. The problem is uncontrolled. Recent lipid tests were reviewed and are high. Exacerbating diseases include obesity. Pertinent negatives include no leg pain, myalgias or shortness of breath. Current antihyperlipidemic treatment includes statins.  Anxiety  Presents for follow-up visit. Symptoms include excessive worry and nervous/anxious behavior. Patient reports no depressed mood, insomnia or shortness of breath. The severity of symptoms is moderate and causing significant distress.    Depression         This is a chronic problem.  The onset quality is gradual. The problem is unchanged.  Associated symptoms include no fatigue, no helplessness, does not have insomnia and no myalgias.  Past medical history includes anxiety.      Past Medical History:  Diagnosis Date  . Anxiety   . Chronic back pain   . Chronic pain   . Depression   . Hyperlipidemia     Past Surgical History:  Procedure Laterality Date  . CATARACT EXTRACTION Right 2014    Family History  Adopted: Yes  Family history unknown: Yes    Social History   Socioeconomic History  . Marital status: Single    Spouse name: Not on file  . Number of children: Not on file  . Years of education: Not on file  . Highest education level: Not on file  Social Needs  . Financial resource strain: Not on file  . Food insecurity - worry: Not on file  . Food insecurity - inability: Not on file  . Transportation needs - medical: Not on file  . Transportation needs - non-medical: Not on file  Occupational History  . Not on file  Tobacco Use  . Smoking status: Current Every Day Smoker    Packs/day: 0.00    Types: E-cigarettes  . Smokeless tobacco:  Former Systems developer    Types: Chew  . Tobacco comment: decreasing nicotene in vapor  Substance and Sexual Activity  . Alcohol use: Yes    Alcohol/week: 7.2 oz    Types: 12 Cans of beer per week  . Drug use: No  . Sexual activity: No    Partners: Female  Other Topics Concern  . Not on file  Social History Narrative  . Not on file     Current Outpatient Medications:  .  buPROPion (WELLBUTRIN XL) 300 MG 24 hr tablet, Take 1 tablet (300 mg total) by mouth daily., Disp: 90 tablet, Rfl: 0 .  busPIRone (BUSPAR) 15 MG tablet, Take 1 tablet (15 mg total) by mouth 2 (two) times daily., Disp: 180 tablet, Rfl: 0 .  citalopram (CELEXA) 40 MG tablet, Take 1 tablet (40 mg total) by mouth daily., Disp: 90 tablet, Rfl: 0 .  ibuprofen (ADVIL,MOTRIN) 800 MG tablet, Take 1 tablet (800 mg total) by mouth daily as needed., Disp: 90 tablet, Rfl: 1 .  orphenadrine (NORFLEX) 100 MG tablet, take 1 tablet by mouth twice a day if needed for muscle spasm, Disp: , Rfl: 0 .  oxyCODONE-acetaminophen (PERCOCET/ROXICET) 5-325 MG tablet, Take 1 tablet by mouth every 4 (four) hours as needed for severe pain. For chronic pain DNF: 09/21/17, 10/21/17 To last for 30 day from fill date, Disp: 180 tablet, Rfl: 0 .  rosuvastatin (CRESTOR) 20 MG tablet, Take 1 tablet (20 mg total) by mouth daily., Disp: 90 tablet, Rfl: 0 .  tiZANidine (ZANAFLEX) 4 MG tablet, Take 1 tablet (4 mg total) by mouth 2 (two) times daily as needed for muscle spasms., Disp: 60 tablet, Rfl: 1  No Known Allergies   Review of Systems  Constitutional: Negative for fatigue.  Respiratory: Negative for shortness of breath.   Musculoskeletal: Negative for myalgias.  Psychiatric/Behavioral: Positive for depression. The patient is nervous/anxious. The patient does not have insomnia.      Objective  Vitals:   10/18/17 1101  BP: 122/64  Pulse: 84  Resp: 16  Temp: 98.7 F (37.1 C)  TempSrc: Oral  SpO2: 97%  Weight: 258 lb 14.4 oz (117.4 kg)    Physical  Exam  Constitutional: He is oriented to person, place, and time and well-developed, well-nourished, and in no distress.  HENT:  Head: Normocephalic and atraumatic.  Cardiovascular: Normal rate, regular rhythm and normal heart sounds.  No murmur heard. Pulmonary/Chest: Effort normal and breath sounds normal. He has no wheezes.  Neurological: He is alert and oriented to person, place, and time.  Psychiatric: Mood, memory, affect and judgment normal.  Nursing note and vitals reviewed.      Assessment & Plan  1. Anxiety Stable and responsive to buspirone taken up to twice daily - busPIRone (BUSPAR) 15 MG tablet; Take 1 tablet (15 mg total) by mouth 2 (two) times daily.  Dispense: 180 tablet; Refill: 0  2. Major depression, chronic Symptoms of depression are in remission on Wellbutrin taken every day - buPROPion (WELLBUTRIN XL) 300 MG 24 hr tablet; Take 1 tablet (300 mg total) by mouth daily.  Dispense: 90 tablet; Refill: 0 - citalopram (CELEXA) 40 MG tablet; Take 1 tablet (40 mg total) by mouth daily.  Dispense: 90 tablet; Refill: 0  3. Mixed hyperlipidemia  - rosuvastatin (CRESTOR) 20 MG tablet; Take 1 tablet (20 mg total) by mouth daily.  Dispense: 90 tablet; Refill: 0 - Lipid panel - COMPLETE METABOLIC PANEL WITH GFR   John Garrett [name] 10/18/2017 11:13 AM

## 2017-11-07 ENCOUNTER — Encounter: Payer: Medicare PPO | Admitting: Student in an Organized Health Care Education/Training Program

## 2017-11-13 ENCOUNTER — Ambulatory Visit
Payer: Medicare PPO | Attending: Student in an Organized Health Care Education/Training Program | Admitting: Student in an Organized Health Care Education/Training Program

## 2017-11-13 ENCOUNTER — Other Ambulatory Visit: Payer: Self-pay

## 2017-11-13 ENCOUNTER — Encounter: Payer: Self-pay | Admitting: Student in an Organized Health Care Education/Training Program

## 2017-11-13 VITALS — HR 75 | Temp 98.2°F | Resp 18 | Ht 68.0 in | Wt 260.0 lb

## 2017-11-13 DIAGNOSIS — M51369 Other intervertebral disc degeneration, lumbar region without mention of lumbar back pain or lower extremity pain: Secondary | ICD-10-CM

## 2017-11-13 DIAGNOSIS — E785 Hyperlipidemia, unspecified: Secondary | ICD-10-CM | POA: Insufficient documentation

## 2017-11-13 DIAGNOSIS — F1120 Opioid dependence, uncomplicated: Secondary | ICD-10-CM | POA: Diagnosis not present

## 2017-11-13 DIAGNOSIS — M5440 Lumbago with sciatica, unspecified side: Secondary | ICD-10-CM | POA: Insufficient documentation

## 2017-11-13 DIAGNOSIS — F17200 Nicotine dependence, unspecified, uncomplicated: Secondary | ICD-10-CM | POA: Insufficient documentation

## 2017-11-13 DIAGNOSIS — E669 Obesity, unspecified: Secondary | ICD-10-CM | POA: Insufficient documentation

## 2017-11-13 DIAGNOSIS — Z6839 Body mass index (BMI) 39.0-39.9, adult: Secondary | ICD-10-CM | POA: Diagnosis not present

## 2017-11-13 DIAGNOSIS — R222 Localized swelling, mass and lump, trunk: Secondary | ICD-10-CM | POA: Diagnosis not present

## 2017-11-13 DIAGNOSIS — M5416 Radiculopathy, lumbar region: Secondary | ICD-10-CM

## 2017-11-13 DIAGNOSIS — R030 Elevated blood-pressure reading, without diagnosis of hypertension: Secondary | ICD-10-CM | POA: Diagnosis not present

## 2017-11-13 DIAGNOSIS — G894 Chronic pain syndrome: Secondary | ICD-10-CM | POA: Diagnosis not present

## 2017-11-13 DIAGNOSIS — M5116 Intervertebral disc disorders with radiculopathy, lumbar region: Secondary | ICD-10-CM | POA: Insufficient documentation

## 2017-11-13 DIAGNOSIS — Z79899 Other long term (current) drug therapy: Secondary | ICD-10-CM | POA: Diagnosis not present

## 2017-11-13 DIAGNOSIS — F419 Anxiety disorder, unspecified: Secondary | ICD-10-CM | POA: Diagnosis not present

## 2017-11-13 DIAGNOSIS — M545 Low back pain: Secondary | ICD-10-CM | POA: Diagnosis present

## 2017-11-13 DIAGNOSIS — R252 Cramp and spasm: Secondary | ICD-10-CM | POA: Insufficient documentation

## 2017-11-13 DIAGNOSIS — F329 Major depressive disorder, single episode, unspecified: Secondary | ICD-10-CM | POA: Diagnosis not present

## 2017-11-13 DIAGNOSIS — M5136 Other intervertebral disc degeneration, lumbar region: Secondary | ICD-10-CM

## 2017-11-13 MED ORDER — OXYCODONE-ACETAMINOPHEN 5-325 MG PO TABS: 1.0000 | ORAL_TABLET | ORAL | 0 refills | Status: DC | PRN

## 2017-11-13 NOTE — Progress Notes (Signed)
Nursing Pain Medication Assessment:  Safety precautions to be maintained throughout the outpatient stay will include: orient to surroundings, keep bed in low position, maintain call bell within reach at all times, provide assistance with transfer out of bed and ambulation.  Medication Inspection Compliance: John Garrett did not comply with our request to bring his pills to be counted. He was reminded that bringing the medication bottles, even when empty, is a requirement.  Medication: None brought in. Pill/Patch Count: None available to be counted. Bottle Appearance: No container available. Did not bring bottle(s) to appointment. Filled Date: N/A Last Medication intake:  Today

## 2017-11-13 NOTE — Progress Notes (Signed)
Patient's Name: John Garrett  MRN: [id number]  Referring Provider: Roselee Nova, MD  DOB: [birth year]  PCP: Roselee Nova, MD  DOS: 11/13/2017  Note by: [name], MD  Service setting: Ambulatory outpatient  Specialty: Interventional Pain Management  Location: [facility] (AMB) Pain Management Facility    Patient type: Established   Primary Reason(s) for Visit: Encounter for prescription drug management. (Level of risk: moderate)  CC: Back Pain (low)  HPI  John Garrett is a 57 y.o. year old, male patient, who comes today for a medication management evaluation. He has Lumbar radiculopathy, chronic; Continuous opioid dependence ([place]); Anxiety; Major depression, chronic; Cervical pain; Chest wall mass; Extreme obesity; Compulsive tobacco user syndrome; Dyslipidemia; Mass of soft tissue; Elevated BP without diagnosis of hypertension; Polypharmacy; Low back pain with sciatica; Hyperlipidemia; and Nocturnal leg cramps on their problem list. His primarily concern today is the Back Pain (low)  Pain Assessment: Location: Lower Back Radiating: right leg Onset: More than a month ago Duration: Chronic pain Quality: Pressure, Constant, Sharp Severity: 7 /10 (self-reported pain score)  Note: Reported level is inconsistent with clinical observations. Clinically the patient looks like a 3/10 A 3/10 is viewed as "Moderate" and described as significantly interfering with activities of daily living (ADL). It becomes difficult to feed, bathe, get dressed, get on and off the toilet or to perform personal hygiene functions. Difficult to get in and out of bed or a chair without assistance. Very distracting. With effort, it can be ignored when deeply involved in activities.       When using our objective Pain Scale, levels between 6 and 10/10 are said to belong in an emergency room, as it progressively worsens from a 6/10, described as severely limiting, requiring emergency care not usually available at an outpatient  pain management facility. At a 6/10 level, communication becomes difficult and requires great effort. Assistance to reach the emergency department may be required. Facial flushing and profuse sweating along with potentially dangerous increases in heart rate and blood pressure will be evident. Effect on ADL:   Timing: Constant Modifying factors: rest, meds  John Garrett was last scheduled for an appointment on 09/17/2017 for medication management. During today's appointment we reviewed John Garrett chronic pain status, as well as his outpatient medication regimen.  The patient  reports that he does not use drugs. His body mass index is 39.53 kg/m.  Patient returns today for medication management.  Continuing to endorse right lower back pain that radiates into his right leg.  States that is similar to the pain that he was experiencing prior to his lumbar epidural steroid injection that he received on August 12, 2017.  Is requesting to have this repeated.  Further details on both, my assessment(s), as well as the proposed treatment plan, please see below.  Controlled Substance Pharmacotherapy Assessment REMS (Risk Evaluation and Mitigation Strategy)  Analgesic: Oxycodone 5 mg up to 6 times a day as needed for chronic pain. MME/day: Approximately 45-50 mg/day.  Hart Rochester, RN  11/13/2017 11:45 AM  Sign at close encounter Nursing Pain Medication Assessment:  Safety precautions to be maintained throughout the outpatient stay will include: orient to surroundings, keep bed in low position, maintain call bell within reach at all times, provide assistance with transfer out of bed and ambulation.  Medication Inspection Compliance: [name] did not comply with our request to bring his pills to be counted. He was reminded that bringing the medication bottles, even when empty, is  a requirement.  Medication: None brought in. Pill/Patch Count: None available to be counted. Bottle Appearance: No  container available. Did not bring bottle(s) to appointment. Filled Date: N/A Last Medication intake:  Today   Pharmacokinetics: Liberation and absorption (onset of action): WNL Distribution (time to peak effect): WNL Metabolism and excretion (duration of action): WNL         Pharmacodynamics: Desired effects: Analgesia: [name] reports >50% benefit. Functional ability: Patient reports that medication allows him to accomplish basic ADLs Clinically meaningful improvement in function (CMIF): Sustained CMIF goals met Perceived effectiveness: Described as relatively effective, allowing for increase in activities of daily living (ADL) Undesirable effects: Side-effects or Adverse reactions: None reported Monitoring: [place] PMP: Online review of the past 33-monthperiod conducted. Compliant with practice rules and regulations Last UDS on record: Summary  Date Value Ref Range Status  07/30/2017 FINAL  Final    Comment:    ==================================================================== TOXASSURE COMP DRUG ANALYSIS,UR ==================================================================== Test                             Result       Flag       Units Drug Present and Declared for Prescription Verification   Oxycodone                      708          EXPECTED   ng/mg creat   Noroxycodone                   803          EXPECTED   ng/mg creat    Sources of oxycodone include scheduled prescription medications.    Noroxycodone is an expected metabolite of oxycodone.   Tizanidine                     PRESENT      EXPECTED   Bupropion                      PRESENT      EXPECTED   Hydroxybupropion               PRESENT      EXPECTED    Hydroxybupropion is an expected metabolite of bupropion.   Citalopram                     PRESENT      EXPECTED   Desmethylcitalopram            PRESENT      EXPECTED    Desmethylcitalopram is an expected metabolite of citalopram or    the enantiomeric form,  escitalopram.   Acetaminophen                  PRESENT      EXPECTED   Ibuprofen                      PRESENT      EXPECTED Drug Absent but Declared for Prescription Verification   Alprazolam                     Not Detected UNEXPECTED ng/mg creat   Orphenadrine                   Not Detected UNEXPECTED ==================================================================== Test  Result    Flag   Units      Ref Range   Creatinine              214              mg/dL      >=20 ==================================================================== Declared Medications:  The flagging and interpretation on this report are based on the  following declared medications.  Unexpected results may arise from  inaccuracies in the declared medications.  **Note: The testing scope of this panel includes these medications:  Alprazolam  Bupropion  Citalopram  Orphenadrine (Norflex)  Oxycodone (Oxycodone Acetaminophen)  **Note: The testing scope of this panel does not include small to  moderate amounts of these reported medications:  Acetaminophen (Oxycodone Acetaminophen)  Ibuprofen  Tizanidine  **Note: The testing scope of this panel does not include following  reported medications:  Buspirone  Rosuvastatin (Crestor) ==================================================================== For clinical consultation, please call [phone]. ====================================================================    UDS interpretation: Compliant          Medication Assessment Form: Reviewed. Patient indicates being compliant with therapy Treatment compliance: Compliant Risk Assessment Profile: Aberrant behavior: See prior evaluations. None observed or detected today Comorbid factors increasing risk of overdose: See prior notes. No additional risks detected today Risk of substance use disorder (SUD): Low Opioid Risk Tool - 11/13/17 1145      Family History of Substance Abuse    Alcohol  Negative    Illegal Drugs  Negative    Rx Drugs  Negative      Personal History of Substance Abuse   Alcohol  Negative    Illegal Drugs  Negative    Rx Drugs  Negative      Age   Age between 57-45 years   No      History of Preadolescent Sexual Abuse   History of Preadolescent Sexual Abuse  Negative or Male      Psychological Disease   Depression  Positive and anxiety   and anxiety     Total Score   Opioid Risk Tool Scoring  1    Opioid Risk Interpretation  Low Risk      ORT Scoring interpretation table:  Score <3 = Low Risk for SUD  Score between 4-7 = Moderate Risk for SUD  Score >8 = High Risk for Opioid Abuse   Risk Mitigation Strategies:  Patient Counseling: Covered Patient-Prescriber Agreement (PPA): Present and active  Notification to other healthcare providers: Done  Pharmacologic Plan: No change in therapy, at this time.             Laboratory Chemistry  Inflammation Markers (CRP: Acute Phase) (ESR: Chronic Phase) No results found for: CRP, ESRSEDRATE, LATICACIDVEN                       Rheumatology Markers No results found for: RF, ANA, LABURIC, URICUR, LYMEIGGIGMAB, LYMEABIGMQN              Renal Function Markers No results found for: BUN, CREATININE, GFRAA, GFRNONAA               Hepatic Function Markers No results found for: AST, ALT, ALBUMIN, ALKPHOS, HCVAB, AMYLASE, LIPASE, AMMONIA               Electrolytes No results found for: NA, K, CL, CALCIUM, MG, PHOS                      Neuropathy Markers  Lab Results  Component Value Date   HGBA1C 5.9 01/03/2015                 Bone Pathology Markers No results found for: VD25OH, [id number], EG3151VO1, [id number], 25OHVITD1, 25OHVITD2, 25OHVITD3, TESTOFREE, TESTOSTERONE                       Coagulation Parameters No results found for: INR, LABPROT, APTT, PLT, DDIMER               Cardiovascular Markers No results found for: BNP, CKTOTAL, CKMB, TROPONINI, HGB, HCT               CA  Markers No results found for: CEA, CA125, LABCA2               Note: Lab results reviewed.  Recent Diagnostic Imaging Results  DG C-Arm 1-60 Min-No Report Fluoroscopy was utilized by the requesting physician.  No radiographic  interpretation.   Complexity Note: Imaging results reviewed. Results shared with John Garrett, using Layman's terms.                         Meds   Current Outpatient Medications:  .  buPROPion (WELLBUTRIN XL) 300 MG 24 hr tablet, Take 1 tablet (300 mg total) by mouth daily., Disp: 90 tablet, Rfl: 0 .  busPIRone (BUSPAR) 15 MG tablet, Take 1 tablet (15 mg total) by mouth 2 (two) times daily., Disp: 180 tablet, Rfl: 0 .  citalopram (CELEXA) 40 MG tablet, Take 1 tablet (40 mg total) by mouth daily., Disp: 90 tablet, Rfl: 0 .  ibuprofen (ADVIL,MOTRIN) 800 MG tablet, Take 1 tablet (800 mg total) by mouth daily as needed., Disp: 90 tablet, Rfl: 1 .  orphenadrine (NORFLEX) 100 MG tablet, take 1 tablet by mouth twice a day if needed for muscle spasm, Disp: , Rfl: 0 .  oxyCODONE-acetaminophen (PERCOCET/ROXICET) 5-325 MG tablet, Take 1 tablet by mouth every 4 (four) hours as needed for severe pain. For chronic pain DNF: 11/20/17, 12/20/17 To last for 30 day from fill date Can fill 1 day earlier if pharmacy closed on fill date, Disp: 180 tablet, Rfl: 0 .  rosuvastatin (CRESTOR) 20 MG tablet, Take 1 tablet (20 mg total) by mouth daily., Disp: 90 tablet, Rfl: 0 .  tiZANidine (ZANAFLEX) 4 MG tablet, Take 1 tablet (4 mg total) by mouth 2 (two) times daily as needed for muscle spasms., Disp: 60 tablet, Rfl: 1  ROS  Constitutional: Denies any fever or chills Gastrointestinal: No reported hemesis, hematochezia, vomiting, or acute GI distress Musculoskeletal: Denies any acute onset joint swelling, redness, loss of ROM, or weakness Neurological: No reported episodes of acute onset apraxia, aphasia, dysarthria, agnosia, amnesia, paralysis, loss of coordination, or loss of  consciousness  Allergies  John Garrett has No Known Allergies.  [place]  Drug: John Garrett  reports that he does not use drugs. Alcohol:  reports that he drinks about 7.2 oz of alcohol per week. Tobacco:  reports that he has been smoking e-cigarettes.  He has been smoking about 0.00 packs per day. He has quit using smokeless tobacco. His smokeless tobacco use included chew. Medical:  has a past medical history of Anxiety, Chronic back pain, Chronic pain, Depression, and Hyperlipidemia. Surgical: John Garrett  has a past surgical history that includes Cataract extraction (Right, 2014). Family: He was adopted. Family history is unknown by patient.  Constitutional Exam  General appearance: Well nourished,  well developed, and well hydrated. In no apparent acute distress Vitals:   11/13/17 1141  Pulse: 75  Resp: 18  Temp: 98.2 F (36.8 C)  TempSrc: Oral  SpO2: 98%  Weight: 260 lb (117.9 kg)  Height: 5' 8"  (1.727 m)   BMI Assessment: Estimated body mass index is 39.53 kg/m as calculated from the following:   Height as of this encounter: 5' 8"  (1.727 m).   Weight as of this encounter: 260 lb (117.9 kg).  BMI interpretation table: BMI level Category Range association with higher incidence of chronic pain  <18 kg/m2 Underweight   18.5-24.9 kg/m2 Ideal body weight   25-29.9 kg/m2 Overweight Increased incidence by 20%  30-34.9 kg/m2 Obese (Class I) Increased incidence by 68%  35-39.9 kg/m2 Severe obesity (Class II) Increased incidence by 136%  >40 kg/m2 Extreme obesity (Class III) Increased incidence by 254%   BMI Readings from Last 4 Encounters:  11/13/17 39.53 kg/m  10/18/17 39.37 kg/m  09/17/17 39.53 kg/m  08/12/17 38.06 kg/m   Wt Readings from Last 4 Encounters:  11/13/17 260 lb (117.9 kg)  10/18/17 258 lb 14.4 oz (117.4 kg)  09/17/17 260 lb (117.9 kg)  08/12/17 243 lb (110.2 kg)  Psych/Mental status: Alert, oriented x 3 (person, place, & time)       Eyes: PERLA Respiratory: No  evidence of acute respiratory distress  Cervical Spine Area Exam  Skin & Axial Inspection: No masses, redness, edema, swelling, or associated skin lesions Alignment: Symmetrical Functional ROM: Unrestricted ROM      Stability: No instability detected Muscle Tone/Strength: Functionally intact. No obvious neuro-muscular anomalies detected. Sensory (Neurological): Unimpaired Palpation: No palpable anomalies              Upper Extremity (UE) Exam    Side: Right upper extremity  Side: Left upper extremity  Skin & Extremity Inspection: Skin color, temperature, and hair growth are WNL. No peripheral edema or cyanosis. No masses, redness, swelling, asymmetry, or associated skin lesions. No contractures.  Skin & Extremity Inspection: Skin color, temperature, and hair growth are WNL. No peripheral edema or cyanosis. No masses, redness, swelling, asymmetry, or associated skin lesions. No contractures.  Functional ROM: Unrestricted ROM          Functional ROM: Unrestricted ROM          Muscle Tone/Strength: Functionally intact. No obvious neuro-muscular anomalies detected.  Muscle Tone/Strength: Functionally intact. No obvious neuro-muscular anomalies detected.  Sensory (Neurological): Unimpaired          Sensory (Neurological): Unimpaired          Palpation: No palpable anomalies              Palpation: No palpable anomalies              Specialized Test(s): Deferred         Specialized Test(s): Deferred          Thoracic Spine Area Exam  Skin & Axial Inspection: No masses, redness, or swelling Alignment: Symmetrical Functional ROM: Unrestricted ROM Stability: No instability detected Muscle Tone/Strength: Functionally intact. No obvious neuro-muscular anomalies detected. Sensory (Neurological): Unimpaired Muscle strength & Tone: No palpable anomalies  Lumbar Spine Area Exam  Skin & Axial Inspection: No masses, redness, or swelling Alignment: Symmetrical Functional ROM: Decreased ROM, to the  right Stability: No instability detected Muscle Tone/Strength: Functionally intact. No obvious neuro-muscular anomalies detected. Sensory (Neurological): Dermatomal pain pattern Palpation: No palpable anomalies       Provocative Tests: Lumbar Hyperextension  and rotation test: Positive       Lumbar Lateral bending test: Positive ipsilateral radicular pain, on the right. Positive for right-sided foraminal stenosis. Patrick's Maneuver: evaluation deferred today                    Gait & Posture Assessment  Ambulation: Unassisted Gait: Relatively normal for age and body habitus Posture: WNL   Lower Extremity Exam    Side: Right lower extremity  Side: Left lower extremity  Skin & Extremity Inspection: Skin color, temperature, and hair growth are WNL. No peripheral edema or cyanosis. No masses, redness, swelling, asymmetry, or associated skin lesions. No contractures.  Skin & Extremity Inspection: Skin color, temperature, and hair growth are WNL. No peripheral edema or cyanosis. No masses, redness, swelling, asymmetry, or associated skin lesions. No contractures.  Functional ROM: Unrestricted ROM          Functional ROM: Unrestricted ROM          Muscle Tone/Strength: Functionally intact. No obvious neuro-muscular anomalies detected.  Muscle Tone/Strength: Functionally intact. No obvious neuro-muscular anomalies detected.  Sensory (Neurological): Unimpaired  Sensory (Neurological): Unimpaired  Palpation: No palpable anomalies  Palpation: No palpable anomalies   Assessment  Primary Diagnosis & Pertinent Problem List: The primary encounter diagnosis was Lumbar radiculopathy, chronic. Diagnoses of Lumbar degenerative disc disease and Chronic pain syndrome were also pertinent to this visit.  Status Diagnosis  Having a Flare-up Persistent Persistent 1. Lumbar radiculopathy, chronic   2. Lumbar degenerative disc disease   3. Chronic pain syndrome     General Recommendations: The pain condition  that the patient suffers from is best treated with a multidisciplinary approach that involves an increase in physical activity to prevent de-conditioning and worsening of the pain cycle, as well as psychological counseling (formal and/or informal) to address the co-morbid psychological affects of pain. Treatment will often involve judicious use of pain medications and interventional procedures to decrease the pain, allowing the patient to participate in the physical activity that will ultimately produce long-lasting pain reductions. The goal of the multidisciplinary approach is to return the patient to a higher level of overall function and to restore their ability to perform activities of daily living.  57 year old male with chronic pain secondary to lumbar spondylosis, lumbar degenerative disc disease, lumbar radiculopathy which is chronic status post lumbar epidural steroid injection which is significantly improved the patient's lower extremity cramps and radiating lower externally pain performed on 08/12/17 who presents for follow-up with a return of his right lower extremity paresthesia symptoms.  The patient's pain is similar to what he was experiencing prior to his previous lumbar epidural steroid injection on 08/12/2017.  He endorses pain that shoots down into his right buttock in his posterior thigh and his right leg.  This is consistent with lumbar radiculopathy.  Patient is requesting repeat lumbar epidural steroid injection.  Last injection was November 12 so reasonable to repeat.  We will also refill medications as below.  No additional dose escalation's beyond current dose.  Plan: -Repeat right lumbar epidural steroid injection #2 for lumbosacral radiculopathy -Refill oxycodone as below at its current dose of 5 mg every 4-6 hours as needed for severe pain.  Maximum 180 tablets a month.  No further dose escalation beyond this. -Follow up for L-ESI -Follow up with Dionisio David for MM in 2 months.  No dose escalation of opioid medications. -Continue ibuprofen 800 mg daily as needed, tizanidine 4 mg twice daily as needed muscle spasms.  Plan of Care  Pharmacotherapy (Medications Ordered): Meds ordered this encounter  Medications  . DISCONTD: oxyCODONE-acetaminophen (PERCOCET/ROXICET) 5-325 MG tablet    Sig: Take 1 tablet by mouth every 4 (four) hours as needed for severe pain. For chronic pain DNF: 11/20/17, 12/20/17 To last for 30 day from fill date Can fill 1 day earlier if pharmacy closed on fill date    Dispense:  180 tablet    Refill:  0  . oxyCODONE-acetaminophen (PERCOCET/ROXICET) 5-325 MG tablet    Sig: Take 1 tablet by mouth every 4 (four) hours as needed for severe pain. For chronic pain DNF: 11/20/17, 12/20/17 To last for 30 day from fill date Can fill 1 day earlier if pharmacy closed on fill date    Dispense:  180 tablet    Refill:  0   Lab-work, procedure(s), and/or referral(s): Orders Placed This Encounter  Procedures  . Lumbar Epidural Injection    Provider-requested follow-up: Return in about 3 weeks (around 12/04/2017) for Medication Management.  Future Appointments  Date Time Provider [place]  01/06/2018 10:45 AM Vevelyn Francois, NP [facility] None  01/07/2018 10:20 AM Steele Sizer, MD [facility]   Time Note: Greater than 50% of the 25 minute(s) of face-to-face time spent with John Garrett, was spent in counseling/coordination of care regarding: the appropriate use of the pain scale, John Garrett primary cause of pain, the treatment plan, treatment alternatives, the risks and possible complications of proposed treatment, medication side effects, going over the informed consent, the appropriate use of his medications, realistic expectations and the medication agreement.  Primary Care Physician: Roselee Nova, MD Location: [facility] Note by: [name], M.D Date: 11/13/2017; Time: 2:28 PM  Patient Instructions    Pain Management Discharge Instructions  General Discharge Instructions :  If you need to reach your doctor call: Monday-Friday 8:00 am - 4:00 pm at [phone] or toll free [phone].  After clinic hours [phone] to have operator reach doctor.  Bring all of your medication bottles to all your appointments in the pain clinic.  To cancel or reschedule your appointment with Pain Management please remember to call 24 hours in advance to avoid a fee.  Refer to the educational materials which you have been given on: General Risks, I had my Procedure. Discharge Instructions, Post Sedation.  Post Procedure Instructions:  The drugs you were given will stay in your system until tomorrow, so for the next 24 hours you should not drive, make any legal decisions or drink any alcoholic beverages.  You may eat anything you prefer, but it is better to start with liquids then soups and crackers, and gradually work up to solid foods.  Please notify your doctor immediately if you have any unusual bleeding, trouble breathing or pain that is not related to your normal pain.  Depending on the type of procedure that was done, some parts of your body may feel week and/or numb.  This usually clears up by tonight or the next day.  Walk with the use of an assistive device or accompanied by an adult for the 24 hours.  You may use ice on the affected area for the first 24 hours.  Put ice in a Ziploc bag and cover with a towel and place against area 15 minutes on 15 minutes off.  You may switch to heat after 24 hours.GENERAL RISKS AND COMPLICATIONS  What are the risk, side effects and possible complications? Generally speaking, most procedures are safe.  However, with any procedure there are risks, side effects, and the possibility of complications.  The risks and complications are dependent upon the sites that are lesioned, or the type of nerve block to be performed.  The closer the procedure is to the  spine, the more serious the risks are.  Great care is taken when placing the radio frequency needles, block needles or lesioning probes, but sometimes complications can occur. 1. Infection: Any time there is an injection through the skin, there is a risk of infection.  This is why sterile conditions are used for these blocks.  There are four possible types of infection. 1. Localized skin infection. 2. Central Nervous System Infection-This can be in the form of Meningitis, which can be deadly. 3. Epidural Infections-This can be in the form of an epidural abscess, which can cause pressure inside of the spine, causing compression of the spinal cord with subsequent paralysis. This would require an emergency surgery to decompress, and there are no guarantees that the patient would recover from the paralysis. 4. Discitis-This is an infection of the intervertebral discs.  It occurs in about 1% of discography procedures.  It is difficult to treat and it may lead to surgery.        2. Pain: the needles have to go through skin and soft tissues, will cause soreness.       3. Damage to internal structures:  The nerves to be lesioned may be near blood vessels or    other nerves which can be potentially damaged.       4. Bleeding: Bleeding is more common if the patient is taking blood thinners such as  aspirin, Coumadin, Ticiid, Plavix, etc., or if he/she have some genetic predisposition  such as hemophilia. Bleeding into the spinal canal can cause compression of the spinal  cord with subsequent paralysis.  This would require an emergency surgery to  decompress and there are no guarantees that the patient would recover from the  paralysis.       5. Pneumothorax:  Puncturing of a lung is a possibility, every time a needle is introduced in  the area of the chest or upper back.  Pneumothorax refers to free air around the  collapsed lung(s), inside of the thoracic cavity (chest cavity).  Another two possible  complications  related to a similar event would include: Hemothorax and Chylothorax.   These are variations of the Pneumothorax, where instead of air around the collapsed  lung(s), you may have blood or chyle, respectively.       6. Spinal headaches: They may occur with any procedures in the area of the spine.       7. Persistent CSF (Cerebro-Spinal Fluid) leakage: This is a rare problem, but may occur  with prolonged intrathecal or epidural catheters either due to the formation of a fistulous  track or a dural tear.       8. Nerve damage: By working so close to the spinal cord, there is always a possibility of  nerve damage, which could be as serious as a permanent spinal cord injury with  paralysis.       9. Death:  Although rare, severe deadly allergic reactions known as "Anaphylactic  reaction" can occur to any of the medications used.      10. Worsening of the symptoms:  We can always make thing worse.  What are the chances of something like this happening? Chances of any of this occuring are extremely low.  By statistics, you have more of a chance of getting killed in a motor vehicle accident: while driving to the hospital than any of the above occurring .  Nevertheless, you should be aware that they are possibilities.  In general, it is similar to taking a shower.  Everybody knows that you can slip, hit your head and get killed.  Does that mean that you should not shower again?  Nevertheless always keep in mind that statistics do not mean anything if you happen to be on the wrong side of them.  Even if a procedure has a 1 (one) in a 1,000,000 (million) chance of going wrong, it you happen to be that one..Also, keep in mind that by statistics, you have more of a chance of having something go wrong when taking medications.  Who should not have this procedure? If you are on a blood thinning medication (e.g. Coumadin, Plavix, see list of "Blood Thinners"), or if you have an active infection going on, you should not  have the procedure.  If you are taking any blood thinners, please inform your physician.  How should I prepare for this procedure?  Do not eat or drink anything at least six hours prior to the procedure.  Bring a driver with you .  It cannot be a taxi.  Come accompanied by an adult that can drive you back, and that is strong enough to help you if your legs get weak or numb from the local anesthetic.  Take all of your medicines the morning of the procedure with just enough water to swallow them.  If you have diabetes, make sure that you are scheduled to have your procedure done first thing in the morning, whenever possible.  If you have diabetes, take only half of your insulin dose and notify our nurse that you have done so as soon as you arrive at the clinic.  If you are diabetic, but only take blood sugar pills (oral hypoglycemic), then do not take them on the morning of your procedure.  You may take them after you have had the procedure.  Do not take aspirin or any aspirin-containing medications, at least eleven (11) days prior to the procedure.  They may prolong bleeding.  Wear loose fitting clothing that may be easy to take off and that you would not mind if it got stained with Betadine or blood.  Do not wear any jewelry or perfume  Remove any nail coloring.  It will interfere with some of our monitoring equipment.  NOTE: Remember that this is not meant to be interpreted as a complete list of all possible complications.  Unforeseen problems may occur.  BLOOD THINNERS The following drugs contain aspirin or other products, which can cause increased bleeding during surgery and should not be taken for 2 weeks prior to and 1 week after surgery.  If you should need take something for relief of minor pain, you may take acetaminophen which is found in Tylenol,m Datril, Anacin-3 and Panadol. It is not blood thinner. The products listed below are.  Do not take any of the products listed below  in addition to any listed on your instruction sheet.  A.P.C or A.P.C with Codeine Codeine Phosphate Capsules #3 Ibuprofen Ridaura  ABC compound Congesprin Imuran rimadil  Advil Cope Indocin Robaxisal  Alka-Seltzer Effervescent Pain Reliever and Antacid Coricidin or Coricidin-D  Indomethacin Rufen  Alka-Seltzer plus Cold Medicine Cosprin Ketoprofen S-A-C Tablets  Anacin Analgesic Tablets or Capsules Coumadin Korlgesic Salflex  Anacin  Extra Strength Analgesic tablets or capsules CP-2 Tablets Lanoril Salicylate  Anaprox Cuprimine Capsules Levenox Salocol  Anexsia-D Dalteparin Magan Salsalate  Anodynos Darvon compound Magnesium Salicylate Sine-off  Ansaid Dasin Capsules Magsal Sodium Salicylate  Anturane Depen Capsules Marnal Soma  APF Arthritis pain formula Dewitt's Pills Measurin Stanback  Argesic Dia-Gesic Meclofenamic Sulfinpyrazone  Arthritis Bayer Timed Release Aspirin Diclofenac Meclomen Sulindac  Arthritis pain formula Anacin Dicumarol Medipren Supac  Analgesic (Safety coated) Arthralgen Diffunasal Mefanamic Suprofen  Arthritis Strength Bufferin Dihydrocodeine Mepro Compound Suprol  Arthropan liquid Dopirydamole Methcarbomol with Aspirin Synalgos  ASA tablets/Enseals Disalcid Micrainin Tagament  Ascriptin Doan's Midol Talwin  Ascriptin A/D Dolene Mobidin Tanderil  Ascriptin Extra Strength Dolobid Moblgesic Ticlid  Ascriptin with Codeine Doloprin or Doloprin with Codeine Momentum Tolectin  Asperbuf Duoprin Mono-gesic Trendar  Aspergum Duradyne Motrin or Motrin IB Triminicin  Aspirin plain, buffered or enteric coated Durasal Myochrisine Trigesic  Aspirin Suppositories Easprin Nalfon Trillsate  Aspirin with Codeine Ecotrin Regular or Extra Strength Naprosyn Uracel  Atromid-S Efficin Naproxen Ursinus  Auranofin Capsules Elmiron Neocylate Vanquish  Axotal Emagrin Norgesic Verin  Azathioprine Empirin or Empirin with Codeine Normiflo Vitamin E  Azolid Emprazil Nuprin Voltaren  Bayer  Aspirin plain, buffered or children's or timed BC Tablets or powders Encaprin Orgaran Warfarin Sodium  Buff-a-Comp Enoxaparin Orudis Zorpin  Buff-a-Comp with Codeine Equegesic Os-Cal-Gesic   Buffaprin Excedrin plain, buffered or Extra Strength Oxalid   Bufferin Arthritis Strength Feldene Oxphenbutazone   Bufferin plain or Extra Strength Feldene Capsules Oxycodone with Aspirin   Bufferin with Codeine Fenoprofen Fenoprofen Pabalate or Pabalate-SF   Buffets II Flogesic Panagesic   Buffinol plain or Extra Strength Florinal or Florinal with Codeine Panwarfarin   Buf-Tabs Flurbiprofen Penicillamine   Butalbital Compound Four-way cold tablets Penicillin   Butazolidin Fragmin Pepto-Bismol   Carbenicillin Geminisyn Percodan   Carna Arthritis Reliever Geopen Persantine   Carprofen Gold's salt Persistin   Chloramphenicol Goody's Phenylbutazone   Chloromycetin Haltrain Piroxlcam   Clmetidine heparin Plaquenil   Cllnoril Hyco-pap Ponstel   Clofibrate Hydroxy chloroquine Propoxyphen         Before stopping any of these medications, be sure to consult the physician who ordered them.  Some, such as Coumadin (Warfarin) are ordered to prevent or treat serious conditions such as "deep thrombosis", "pumonary embolisms", and other heart problems.  The amount of time that you may need off of the medication may also vary with the medication and the reason for which you were taking it.  If you are taking any of these medications, please make sure you notify your pain physician before you undergo any procedures.         Epidural Steroid Injection Patient Information  Description: The epidural space surrounds the nerves as they exit the spinal cord.  In some patients, the nerves can be compressed and inflamed by a bulging disc or a tight spinal canal (spinal stenosis).  By injecting steroids into the epidural space, we can bring irritated nerves into direct contact with a potentially helpful medication.   These steroids act directly on the irritated nerves and can reduce swelling and inflammation which often leads to decreased pain.  Epidural steroids may be injected anywhere along the spine and from the neck to the low back depending upon the location of your pain.   After numbing the skin with local anesthetic (like Novocaine), a small needle is passed into the epidural space slowly.  You may experience a sensation of pressure while this is being done.  The entire block usually last less than 10 minutes.  Conditions which may be treated by epidural steroids:   Low back and leg pain  Neck and arm pain  Spinal stenosis  Post-laminectomy syndrome  Herpes zoster (shingles) pain  Pain from compression fractures  Preparation for the injection:  1. Do not eat any solid food or dairy products within 8 hours of your appointment.  2. You may drink clear liquids up to 3 hours before appointment.  Clear liquids include water, black coffee, juice or soda.  No milk or cream please. 3. You may take your regular medication, including pain medications, with a sip of water before your appointment  Diabetics should hold regular insulin (if taken separately) and take 1/2 normal NPH dos the morning of the procedure.  Carry some sugar containing items with you to your appointment. 4. A driver must accompany you and be prepared to drive you home after your procedure.  5. Bring all your current medications with your. 6. An IV may be inserted and sedation may be given at the discretion of the physician.   7. A blood pressure cuff, EKG and other monitors will often be applied during the procedure.  Some patients may need to have extra oxygen administered for a short period. 8. You will be asked to provide medical information, including your allergies, prior to the procedure.  We must know immediately if you are taking blood thinners (like Coumadin/Warfarin)  Or if you are allergic to IV iodine contrast (dye). We  must know if you could possible be pregnant.  Possible side-effects:  Bleeding from needle site  Infection (rare, may require surgery)  Nerve injury (rare)  Numbness & tingling (temporary)  Difficulty urinating (rare, temporary)  Spinal headache ( a headache worse with upright posture)  Light -headedness (temporary)  Pain at injection site (several days)  Decreased blood pressure (temporary)  Weakness in arm/leg (temporary)  Pressure sensation in back/neck (temporary)  Call if you experience:  Fever/chills associated with headache or increased back/neck pain.  Headache worsened by an upright position.  New onset weakness or numbness of an extremity below the injection site  Hives or difficulty breathing (go to the emergency room)  Inflammation or drainage at the infection site  Severe back/neck pain  Any new symptoms which are concerning to you  Please note:  Although the local anesthetic injected can often make your back or neck feel good for several hours after the injection, the pain will likely return.  It takes 3-7 days for steroids to work in the epidural space.  You may not notice any pain relief for at least that one week.  If effective, we will often do a series of three injections spaced 3-6 weeks apart to maximally decrease your pain.  After the initial series, we generally will wait several months before considering a repeat injection of the same type.  If you have any questions, please call [phone] [facility]

## 2017-11-13 NOTE — Patient Instructions (Signed)
Pain Management Discharge Instructions  General Discharge Instructions :  If you need to reach your doctor call: Monday-Friday 8:00 am - 4:00 pm at [phone] or toll free [phone].  After clinic hours [phone] to have operator reach doctor.  Bring all of your medication bottles to all your appointments in the pain clinic.  To cancel or reschedule your appointment with Pain Management please remember to call 24 hours in advance to avoid a fee.  Refer to the educational materials which you have been given on: General Risks, I had my Procedure. Discharge Instructions, Post Sedation.  Post Procedure Instructions:  The drugs you were given will stay in your system until tomorrow, so for the next 24 hours you should not drive, make any legal decisions or drink any alcoholic beverages.  You may eat anything you prefer, but it is better to start with liquids then soups and crackers, and gradually work up to solid foods.  Please notify your doctor immediately if you have any unusual bleeding, trouble breathing or pain that is not related to your normal pain.  Depending on the type of procedure that was done, some parts of your body may feel week and/or numb.  This usually clears up by tonight or the next day.  Walk with the use of an assistive device or accompanied by an adult for the 24 hours.  You may use ice on the affected area for the first 24 hours.  Put ice in a Ziploc bag and cover with a towel and place against area 15 minutes on 15 minutes off.  You may switch to heat after 24 hours.GENERAL RISKS AND COMPLICATIONS  What are the risk, side effects and possible complications? Generally speaking, most procedures are safe.  However, with any procedure there are risks, side effects, and the possibility of complications.  The risks and complications are dependent upon the sites that are lesioned, or the type of nerve block to be performed.  The closer the procedure is to the spine,  the more serious the risks are.  Great care is taken when placing the radio frequency needles, block needles or lesioning probes, but sometimes complications can occur. 1. Infection: Any time there is an injection through the skin, there is a risk of infection.  This is why sterile conditions are used for these blocks.  There are four possible types of infection. 1. Localized skin infection. 2. Central Nervous System Infection-This can be in the form of Meningitis, which can be deadly. 3. Epidural Infections-This can be in the form of an epidural abscess, which can cause pressure inside of the spine, causing compression of the spinal cord with subsequent paralysis. This would require an emergency surgery to decompress, and there are no guarantees that the patient would recover from the paralysis. 4. Discitis-This is an infection of the intervertebral discs.  It occurs in about 1% of discography procedures.  It is difficult to treat and it may lead to surgery.        2. Pain: the needles have to go through skin and soft tissues, will cause soreness.       3. Damage to internal structures:  The nerves to be lesioned may be near blood vessels or    other nerves which can be potentially damaged.       4. Bleeding: Bleeding is more common if the patient is taking blood thinners such as  aspirin, Coumadin, Ticiid, Plavix, etc., or if he/she have some genetic predisposition  such as   hemophilia. Bleeding into the spinal canal can cause compression of the spinal  cord with subsequent paralysis.  This would require an emergency surgery to  decompress and there are no guarantees that the patient would recover from the  paralysis.       5. Pneumothorax:  Puncturing of a lung is a possibility, every time a needle is introduced in  the area of the chest or upper back.  Pneumothorax refers to free air around the  collapsed lung(s), inside of the thoracic cavity (chest cavity).  Another two possible  complications  related to a similar event would include: Hemothorax and Chylothorax.   These are variations of the Pneumothorax, where instead of air around the collapsed  lung(s), you may have blood or chyle, respectively.       6. Spinal headaches: They may occur with any procedures in the area of the spine.       7. Persistent CSF (Cerebro-Spinal Fluid) leakage: This is a rare problem, but may occur  with prolonged intrathecal or epidural catheters either due to the formation of a fistulous  track or a dural tear.       8. Nerve damage: By working so close to the spinal cord, there is always a possibility of  nerve damage, which could be as serious as a permanent spinal cord injury with  paralysis.       9. Death:  Although rare, severe deadly allergic reactions known as "Anaphylactic  reaction" can occur to any of the medications used.      10. Worsening of the symptoms:  We can always make thing worse.  What are the chances of something like this happening? Chances of any of this occuring are extremely low.  By statistics, you have more of a chance of getting killed in a motor vehicle accident: while driving to the hospital than any of the above occurring .  Nevertheless, you should be aware that they are possibilities.  In general, it is similar to taking a shower.  Everybody knows that you can slip, hit your head and get killed.  Does that mean that you should not shower again?  Nevertheless always keep in mind that statistics do not mean anything if you happen to be on the wrong side of them.  Even if a procedure has a 1 (one) in a 1,000,000 (million) chance of going wrong, it you happen to be that one..Also, keep in mind that by statistics, you have more of a chance of having something go wrong when taking medications.  Who should not have this procedure? If you are on a blood thinning medication (e.g. Coumadin, Plavix, see list of "Blood Thinners"), or if you have an active infection going on, you should not  have the procedure.  If you are taking any blood thinners, please inform your physician.  How should I prepare for this procedure?  Do not eat or drink anything at least six hours prior to the procedure.  Bring a driver with you .  It cannot be a taxi.  Come accompanied by an adult that can drive you back, and that is strong enough to help you if your legs get weak or numb from the local anesthetic.  Take all of your medicines the morning of the procedure with just enough water to swallow them.  If you have diabetes, make sure that you are scheduled to have your procedure done first thing in the morning, whenever possible.  If you have diabetes,   take only half of your insulin dose and notify our nurse that you have done so as soon as you arrive at the clinic.  If you are diabetic, but only take blood sugar pills (oral hypoglycemic), then do not take them on the morning of your procedure.  You may take them after you have had the procedure.  Do not take aspirin or any aspirin-containing medications, at least eleven (11) days prior to the procedure.  They may prolong bleeding.  Wear loose fitting clothing that may be easy to take off and that you would not mind if it got stained with Betadine or blood.  Do not wear any jewelry or perfume  Remove any nail coloring.  It will interfere with some of our monitoring equipment.  NOTE: Remember that this is not meant to be interpreted as a complete list of all possible complications.  Unforeseen problems may occur.  BLOOD THINNERS The following drugs contain aspirin or other products, which can cause increased bleeding during surgery and should not be taken for 2 weeks prior to and 1 week after surgery.  If you should need take something for relief of minor pain, you may take acetaminophen which is found in Tylenol,m Datril, Anacin-3 and Panadol. It is not blood thinner. The products listed below are.  Do not take any of the products listed below  in addition to any listed on your instruction sheet.  A.P.C or A.P.C with Codeine Codeine Phosphate Capsules #3 Ibuprofen Ridaura  ABC compound Congesprin Imuran rimadil  Advil Cope Indocin Robaxisal  Alka-Seltzer Effervescent Pain Reliever and Antacid Coricidin or Coricidin-D  Indomethacin Rufen  Alka-Seltzer plus Cold Medicine Cosprin Ketoprofen S-A-C Tablets  Anacin Analgesic Tablets or Capsules Coumadin Korlgesic Salflex  Anacin Extra Strength Analgesic tablets or capsules CP-2 Tablets Lanoril Salicylate  Anaprox Cuprimine Capsules Levenox Salocol  Anexsia-D Dalteparin Magan Salsalate  Anodynos Darvon compound Magnesium Salicylate Sine-off  Ansaid Dasin Capsules Magsal Sodium Salicylate  Anturane Depen Capsules Marnal Soma  APF Arthritis pain formula Dewitt's Pills Measurin Stanback  Argesic Dia-Gesic Meclofenamic Sulfinpyrazone  Arthritis Bayer Timed Release Aspirin Diclofenac Meclomen Sulindac  Arthritis pain formula Anacin Dicumarol Medipren Supac  Analgesic (Safety coated) Arthralgen Diffunasal Mefanamic Suprofen  Arthritis Strength Bufferin Dihydrocodeine Mepro Compound Suprol  Arthropan liquid Dopirydamole Methcarbomol with Aspirin Synalgos  ASA tablets/Enseals Disalcid Micrainin Tagament  Ascriptin Doan's Midol Talwin  Ascriptin A/D Dolene Mobidin Tanderil  Ascriptin Extra Strength Dolobid Moblgesic Ticlid  Ascriptin with Codeine Doloprin or Doloprin with Codeine Momentum Tolectin  Asperbuf Duoprin Mono-gesic Trendar  Aspergum Duradyne Motrin or Motrin IB Triminicin  Aspirin plain, buffered or enteric coated Durasal Myochrisine Trigesic  Aspirin Suppositories Easprin Nalfon Trillsate  Aspirin with Codeine Ecotrin Regular or Extra Strength Naprosyn Uracel  Atromid-S Efficin Naproxen Ursinus  Auranofin Capsules Elmiron Neocylate Vanquish  Axotal Emagrin Norgesic Verin  Azathioprine Empirin or Empirin with Codeine Normiflo Vitamin E  Azolid Emprazil Nuprin Voltaren  Bayer  Aspirin plain, buffered or children's or timed BC Tablets or powders Encaprin Orgaran Warfarin Sodium  Buff-a-Comp Enoxaparin Orudis Zorpin  Buff-a-Comp with Codeine Equegesic Os-Cal-Gesic   Buffaprin Excedrin plain, buffered or Extra Strength Oxalid   Bufferin Arthritis Strength Feldene Oxphenbutazone   Bufferin plain or Extra Strength Feldene Capsules Oxycodone with Aspirin   Bufferin with Codeine Fenoprofen Fenoprofen Pabalate or Pabalate-SF   Buffets II Flogesic Panagesic   Buffinol plain or Extra Strength Florinal or Florinal with Codeine Panwarfarin   Buf-Tabs Flurbiprofen Penicillamine   Butalbital Compound Four-way cold tablets   Penicillin   Butazolidin Fragmin Pepto-Bismol   Carbenicillin Geminisyn Percodan   Carna Arthritis Reliever Geopen Persantine   Carprofen Gold's salt Persistin   Chloramphenicol Goody's Phenylbutazone   Chloromycetin Haltrain Piroxlcam   Clmetidine heparin Plaquenil   Cllnoril Hyco-pap Ponstel   Clofibrate Hydroxy chloroquine Propoxyphen         Before stopping any of these medications, be sure to consult the physician who ordered them.  Some, such as Coumadin (Warfarin) are ordered to prevent or treat serious conditions such as "deep thrombosis", "pumonary embolisms", and other heart problems.  The amount of time that you may need off of the medication may also vary with the medication and the reason for which you were taking it.  If you are taking any of these medications, please make sure you notify your pain physician before you undergo any procedures.         Epidural Steroid Injection Patient Information  Description: The epidural space surrounds the nerves as they exit the spinal cord.  In some patients, the nerves can be compressed and inflamed by a bulging disc or a tight spinal canal (spinal stenosis).  By injecting steroids into the epidural space, we can bring irritated nerves into direct contact with a potentially helpful medication.   These steroids act directly on the irritated nerves and can reduce swelling and inflammation which often leads to decreased pain.  Epidural steroids may be injected anywhere along the spine and from the neck to the low back depending upon the location of your pain.   After numbing the skin with local anesthetic (like Novocaine), a small needle is passed into the epidural space slowly.  You may experience a sensation of pressure while this is being done.  The entire block usually last less than 10 minutes.  Conditions which may be treated by epidural steroids:   Low back and leg pain  Neck and arm pain  Spinal stenosis  Post-laminectomy syndrome  Herpes zoster (shingles) pain  Pain from compression fractures  Preparation for the injection:  1. Do not eat any solid food or dairy products within 8 hours of your appointment.  2. You may drink clear liquids up to 3 hours before appointment.  Clear liquids include water, black coffee, juice or soda.  No milk or cream please. 3. You may take your regular medication, including pain medications, with a sip of water before your appointment  Diabetics should hold regular insulin (if taken separately) and take 1/2 normal NPH dos the morning of the procedure.  Carry some sugar containing items with you to your appointment. 4. A driver must accompany you and be prepared to drive you home after your procedure.  5. Bring all your current medications with your. 6. An IV may be inserted and sedation may be given at the discretion of the physician.   7. A blood pressure cuff, EKG and other monitors will often be applied during the procedure.  Some patients may need to have extra oxygen administered for a short period. 8. You will be asked to provide medical information, including your allergies, prior to the procedure.  We must know immediately if you are taking blood thinners (like Coumadin/Warfarin)  Or if you are allergic to IV iodine contrast (dye). We  must know if you could possible be pregnant.  Possible side-effects:  Bleeding from needle site  Infection (rare, may require surgery)  Nerve injury (rare)  Numbness & tingling (temporary)  Difficulty urinating (rare, temporary)  Spinal headache (   a headache worse with upright posture)  Light -headedness (temporary)  Pain at injection site (several days)  Decreased blood pressure (temporary)  Weakness in arm/leg (temporary)  Pressure sensation in back/neck (temporary)  Call if you experience:  Fever/chills associated with headache or increased back/neck pain.  Headache worsened by an upright position.  New onset weakness or numbness of an extremity below the injection site  Hives or difficulty breathing (go to the emergency room)  Inflammation or drainage at the infection site  Severe back/neck pain  Any new symptoms which are concerning to you  Please note:  Although the local anesthetic injected can often make your back or neck feel good for several hours after the injection, the pain will likely return.  It takes 3-7 days for steroids to work in the epidural space.  You may not notice any pain relief for at least that one week.  If effective, we will often do a series of three injections spaced 3-6 weeks apart to maximally decrease your pain.  After the initial series, we generally will wait several months before considering a repeat injection of the same type.  If you have any questions, please call [phone] [facility] 

## 2017-11-27 ENCOUNTER — Other Ambulatory Visit: Payer: Self-pay

## 2017-11-27 ENCOUNTER — Encounter: Payer: Self-pay | Admitting: Student in an Organized Health Care Education/Training Program

## 2017-11-27 ENCOUNTER — Ambulatory Visit
Admission: RE | Admit: 2017-11-27 | Discharge: 2017-11-27 | Disposition: A | Discharge status: Home / Self Care | Payer: Medicare PPO | Source: Ambulatory Visit | Attending: Student in an Organized Health Care Education/Training Program | Admitting: Student in an Organized Health Care Education/Training Program

## 2017-11-27 ENCOUNTER — Ambulatory Visit (HOSPITAL_BASED_OUTPATIENT_CLINIC_OR_DEPARTMENT_OTHER): Payer: Medicare PPO | Admitting: Student in an Organized Health Care Education/Training Program

## 2017-11-27 DIAGNOSIS — M5416 Radiculopathy, lumbar region: Secondary | ICD-10-CM | POA: Insufficient documentation

## 2017-11-27 DIAGNOSIS — Z79891 Long term (current) use of opiate analgesic: Secondary | ICD-10-CM | POA: Diagnosis not present

## 2017-11-27 DIAGNOSIS — Z9841 Cataract extraction status, right eye: Secondary | ICD-10-CM | POA: Insufficient documentation

## 2017-11-27 DIAGNOSIS — Z79899 Other long term (current) drug therapy: Secondary | ICD-10-CM | POA: Insufficient documentation

## 2017-11-27 DIAGNOSIS — M545 Low back pain: Secondary | ICD-10-CM | POA: Diagnosis present

## 2017-11-27 DIAGNOSIS — F419 Anxiety disorder, unspecified: Secondary | ICD-10-CM | POA: Insufficient documentation

## 2017-11-27 MED ORDER — SODIUM CHLORIDE 0.9% FLUSH
2.0000 mL | Freq: Once | INTRAVENOUS | Status: AC
  Administered 2017-11-27: 10 mL

## 2017-11-27 MED ORDER — LIDOCAINE HCL (PF) 1 % IJ SOLN
4.5000 mL | Freq: Once | INTRAMUSCULAR | Status: AC
  Administered 2017-11-27: 5 mL
  Filled 2017-11-27: qty 5

## 2017-11-27 MED ORDER — LACTATED RINGERS IV SOLN
1000.0000 mL | Freq: Once | INTRAVENOUS | Status: AC
  Administered 2017-11-27: 1000 mL via INTRAVENOUS

## 2017-11-27 MED ORDER — ROPIVACAINE HCL 2 MG/ML IJ SOLN
2.0000 mL | Freq: Once | INTRAMUSCULAR | Status: AC
  Administered 2017-11-27: 10 mL via EPIDURAL
  Filled 2017-11-27: qty 10

## 2017-11-27 MED ORDER — IOPAMIDOL (ISOVUE-M 200) INJECTION 41%
10.0000 mL | Freq: Once | INTRAMUSCULAR | Status: AC
  Administered 2017-11-27: 10 mL via EPIDURAL
  Filled 2017-11-27: qty 10

## 2017-11-27 MED ORDER — DEXAMETHASONE SODIUM PHOSPHATE 10 MG/ML IJ SOLN
10.0000 mg | Freq: Once | INTRAMUSCULAR | Status: AC
  Administered 2017-11-27: 10 mg
  Filled 2017-11-27: qty 1

## 2017-11-27 MED ORDER — FENTANYL CITRATE (PF) 100 MCG/2ML IJ SOLN
25.0000 ug | INTRAMUSCULAR | Status: DC | PRN
  Administered 2017-11-27: 75 ug via INTRAVENOUS
  Filled 2017-11-27: qty 2

## 2017-11-27 NOTE — Progress Notes (Signed)
Safety precautions to be maintained throughout the outpatient stay will include: orient to surroundings, keep bed in low position, maintain call bell within reach at all times, provide assistance with transfer out of bed and ambulation.  

## 2017-11-27 NOTE — Patient Instructions (Signed)

## 2017-11-27 NOTE — Progress Notes (Signed)
No patient's Name: Daksh Coates  MRN: [id number]  Referring Provider: [name], MD  DOB: 04-Apr-1961  PCP: Roselee Nova, MD (Inactive)  DOS: 11/27/2017  Note by: [name], MD  Service setting: Ambulatory outpatient  Specialty: Interventional Pain Management  Patient type: Established  Location: [facility] (AMB) Pain Management Facility  Visit type: Interventional Procedure   Primary Reason for Visit: Interventional Pain Management Treatment. CC: Back Pain (low and tailbone is included) and Leg Pain (right)  Procedure:  Anesthesia, Analgesia, Anxiolysis:  Type: Therapeutic Inter-Laminar Epidural Steroid Injection #2 Region: Lumbar Level: L4-5 Level. Laterality: Midline directed towards the right         Type: Local Anesthesia with Moderate (Conscious) Sedation Local Anesthetic: Lidocaine 1% Route: Intravenous (IV) IV Access: Secured Sedation: Meaningful verbal contact was maintained at all times during the procedure  Indication(s): Analgesia and Anxiety   Indications: 1. Lumbar radiculopathy, chronic    Pain Score: Pre-procedure: 8 /10 Post-procedure: 3 /10  Pre-op Assessment:  Mr. Ziebell is a 57 y.o. (year old), male patient, seen today for interventional treatment. He  has a past surgical history that includes Cataract extraction (Right, 2014). Mr. Keeven has a current medication list which includes the following prescription(s): bupropion, buspirone, citalopram, ibuprofen, orphenadrine, oxycodone-acetaminophen, rosuvastatin, and tizanidine, and the following Facility-Administered Medications: fentanyl. His primarily concern today is the Back Pain (low and tailbone is included) and Leg Pain (right)  Initial Vital Signs: Blood pressure (!) 119/91, pulse 90, temperature 98.5 F (36.9 C), temperature source Oral, resp. rate 20, height 5\' 7"  (1.702 m), weight 243 lb (110.2 kg), SpO2 97 %. BMI: Estimated body mass index is 34.97 kg/m as calculated from the following:   Height as of  this encounter: 5\' 8"  (1.727 m).   Weight as of this encounter: 230 lb (104.3 kg).  Risk Assessment: Allergies: Reviewed. He has No Known Allergies.  Allergy Precautions: None required Coagulopathies: Reviewed. None identified.  Blood-thinner therapy: None at this time Active Infection(s): Reviewed. None identified. Mr. Arrazola is afebrile  Site Confirmation: Mr. Torr was asked to confirm the procedure and laterality before marking the site Procedure checklist: Completed Consent: Before the procedure and under the influence of no sedative(s), amnesic(s), or anxiolytics, the patient was informed of the treatment options, risks and possible complications. To fulfill our ethical and legal obligations, as recommended by the American Medical Association's Code of Ethics, I have informed the patient of my clinical impression; the nature and purpose of the treatment or procedure; the risks, benefits, and possible complications of the intervention; the alternatives, including doing nothing; the risk(s) and benefit(s) of the alternative treatment(s) or procedure(s); and the risk(s) and benefit(s) of doing nothing. The patient was provided information about the general risks and possible complications associated with the procedure. These may include, but are not limited to: failure to achieve desired goals, infection, bleeding, organ or nerve damage, allergic reactions, paralysis, and death. In addition, the patient was informed of those risks and complications associated to Spine-related procedures, such as failure to decrease pain; infection (i.e.: Meningitis, epidural or intraspinal abscess); bleeding (i.e.: epidural hematoma, subarachnoid hemorrhage, or any other type of intraspinal or peri-dural bleeding); organ or nerve damage (i.e.: Any type of peripheral nerve, nerve root, or spinal cord injury) with subsequent damage to sensory, motor, and/or autonomic systems, resulting in permanent pain, numbness,  and/or weakness of one or several areas of the body; allergic reactions; (i.e.: anaphylactic reaction); and/or death. Furthermore, the patient was informed of those risks  and complications associated with the medications. These include, but are not limited to: allergic reactions (i.e.: anaphylactic or anaphylactoid reaction(s)); adrenal axis suppression; blood sugar elevation that in diabetics may result in ketoacidosis or comma; water retention that in patients with history of congestive heart failure may result in shortness of breath, pulmonary edema, and decompensation with resultant heart failure; weight gain; swelling or edema; medication-induced neural toxicity; particulate matter embolism and blood vessel occlusion with resultant organ, and/or nervous system infarction; and/or aseptic necrosis of one or more joints. Finally, the patient was informed that Medicine is not an exact science; therefore, there is also the possibility of unforeseen or unpredictable risks and/or possible complications that may result in a catastrophic outcome. The patient indicated having understood very clearly. We have given the patient no guarantees and we have made no promises. Enough time was given to the patient to ask questions, all of which were answered to the patient's satisfaction. Mr. Bonanno has indicated that he wanted to continue with the procedure. Attestation: I, the ordering provider, attest that I have discussed with the patient the benefits, risks, side-effects, alternatives, likelihood of achieving goals, and potential problems during recovery for the procedure that I have provided informed consent. Date: 11/27/2017; Time: 10:37 AM  Pre-Procedure Preparation:  Monitoring: As per clinic protocol. Respiration, ETCO2, SpO2, BP, heart rate and rhythm monitor placed and checked for adequate function Safety Precautions: Patient was assessed for positional comfort and pressure points before starting the  procedure. Time-out: I initiated and conducted the "Time-out" before starting the procedure, as per protocol. The patient was asked to participate by confirming the accuracy of the "Time Out" information. Verification of the correct person, site, and procedure were performed and confirmed by me, the nursing staff, and the patient. "Time-out" conducted as per Joint Commission's Universal Protocol (UP.01.01.01). "Time-out" Date & Time: 11/27/2017; 1040 hrs.  Description of Procedure Process:   Position: Prone with head of the table was raised to facilitate breathing. Target Area: The interlaminar space, initially targeting the lower laminar border of the superior vertebral body. Approach: Paramedial approach. Area Prepped: Entire Posterior Lumbar Region Prepping solution: ChloraPrep (2% chlorhexidine gluconate and 70% isopropyl alcohol) Safety Precautions: Aspiration looking for blood return was conducted prior to all injections. At no point did we inject any substances, as a needle was being advanced. No attempts were made at seeking any paresthesias. Safe injection practices and needle disposal techniques used. Medications properly checked for expiration dates. SDV (single dose vial) medications used. Description of the Procedure: Protocol guidelines were followed. The procedure needle was introduced through the skin, ipsilateral to the reported pain, and advanced to the target area. Bone was contacted and the needle walked caudad, until the lamina was cleared. The epidural space was identified using "loss-of-resistance technique" with 2-3 ml of PF-NaCl (0.9% NSS), in a 5cc LOR glass syringe. Vitals:   11/27/17 1052 11/27/17 1101 11/27/17 1111 11/27/17 1121  BP: 138/86 (!) 152/82 124/81 114/83  Pulse:      Resp: (!) 21 18 16 17   Temp:      TempSrc:      SpO2: 94% 98% 97% 96%  Weight:      Height:        Start Time: 1040 hrs. End Time: 1050 hrs. Materials:  Needle(s) Type: Epidural  needle Gauge: 17G Length: 3.5-in Medication(s): We administered lactated ringers, fentaNYL, iopamidol, ropivacaine (PF) 2 mg/mL (0.2%), sodium chloride flush, dexamethasone, and lidocaine (PF). Please see chart orders for dosing details. 8  cc solution: 5 cc preservative-free saline, 2 cc 0.2% ropivacaine, 1 cc of Decadron 10 mg/cc. Imaging Guidance (Spinal):  Type of Imaging Technique: Fluoroscopy Guidance (Spinal) Indication(s): Assistance in needle guidance and placement for procedures requiring needle placement in or near specific anatomical locations not easily accessible without such assistance. Exposure Time: Please see nurses notes. Contrast: Before injecting any contrast, we confirmed that the patient did not have an allergy to iodine, shellfish, or radiological contrast. Once satisfactory needle placement was completed at the desired level, radiological contrast was injected. Contrast injected under live fluoroscopy. No contrast complications. See chart for type and volume of contrast used. Fluoroscopic Guidance: I was personally present during the use of fluoroscopy. "Tunnel Vision Technique" used to obtain the best possible view of the target area. Parallax error corrected before commencing the procedure. "Direction-depth-direction" technique used to introduce the needle under continuous pulsed fluoroscopy. Once target was reached, antero-posterior, oblique, and lateral fluoroscopic projection used confirm needle placement in all planes. Images permanently stored in EMR. Interpretation: I personally interpreted the imaging intraoperatively. Adequate needle placement confirmed in multiple planes. Appropriate spread of contrast into desired area was observed. No evidence of afferent or efferent intravascular uptake. No intrathecal or subarachnoid spread observed. Permanent images saved into the patient's record.  Antibiotic Prophylaxis:  Indication(s): None identified Antibiotic given:  None  Post-operative Assessment:  EBL: None Complications: No immediate post-treatment complications observed by team, or reported by patient. Note: The patient tolerated the entire procedure well. A repeat set of vitals were taken after the procedure and the patient was kept under observation following institutional policy, for this type of procedure. Post-procedural neurological assessment was performed, showing return to baseline, prior to discharge. The patient was provided with post-procedure discharge instructions, including a section on how to identify potential problems. Should any problems arise concerning this procedure, the patient was given instructions to immediately contact us, at any time, without hesitation. In any case, we plan to contact the patient by telephone for a follow-up status report regarding this interventional procedure. Comments:  No additional relevant information. 5 out of 5 strength bilateral lower extremity: Plantar flexion, dorsiflexion, knee flexion, knee extension.  Plan of Care    Imaging Orders     DG C-Arm 1-60 Min-No Report Procedure Orders    No procedure(s) ordered today  .  Medications ordered for procedure: Meds ordered this encounter  Medications  . lactated ringers infusion 1,000 mL  . fentaNYL (SUBLIMAZE) injection 25-100 mcg    Make sure Narcan is available in the pyxis when using this medication. In the event of respiratory depression (RR< 8/min): Titrate NARCAN (naloxone) in increments of 0.1 to 0.2 mg IV at 2-3 minute intervals, until desired degree of reversal.  . iopamidol (ISOVUE-M) 41 % intrathecal injection 10 mL  . ropivacaine (PF) 2 mg/mL (0.2%) (NAROPIN) injection 2 mL  . sodium chloride flush (NS) 0.9 % injection 2 mL  . dexamethasone (DECADRON) injection 10 mg  . lidocaine (PF) (XYLOCAINE) 1 % injection 4.5 mL   Medications administered: We administered lactated ringers, fentaNYL, iopamidol, ropivacaine (PF) 2 mg/mL (0.2%),  sodium chloride flush, dexamethasone, and lidocaine (PF).  See the medical record for exact dosing, route, and time of administration.  New Prescriptions   No medications on file   Disposition: Discharge home  Discharge Date & Time: 11/27/2017; 1124 hrs.   Physician-requested Follow-up: with Dionisio David for MM as below Future Appointments  Date Time Provider [place]  01/06/2018 10:45 AM Dionisio David  M, NP [facility] None  01/07/2018 10:20 AM Steele Sizer, MD [facility]   Primary Care Physician: Roselee Nova, MD (Inactive) Location: [facility] Facility Note by: [name], MD Date: 11/27/2017; Time: 12:58 PM  Disclaimer:  Medicine is not an exact science. The only guarantee in medicine is that nothing is guaranteed. It is important to note that the decision to proceed with this intervention was based on the information collected from the patient. The Data and conclusions were drawn from the patient's questionnaire, the interview, and the physical examination. Because the information was provided in large part by the patient, it cannot be guaranteed that it has not been purposely or unconsciously manipulated. Every effort has been made to obtain as much relevant data as possible for this evaluation. It is important to note that the conclusions that lead to this procedure are derived in large part from the available data. Always take into account that the treatment will also be dependent on availability of resources and existing treatment guidelines, considered by other Pain Management Practitioners as being common knowledge and practice, at the time of the intervention. For Medico-Legal purposes, it is also important to point out that variation in procedural techniques and pharmacological choices are the acceptable norm. The indications, contraindications, technique, and results of the above procedure should only be interpreted and judged by a Board-Certified  Interventional Pain Specialist with extensive familiarity and expertise in the same exact procedure and technique.

## 2017-11-28 ENCOUNTER — Telehealth: Payer: Self-pay | Admitting: *Deleted

## 2017-11-28 NOTE — Telephone Encounter (Signed)
Voicemail left with patient to call our office if there are questions or concerns re; procedure on yesterday.  

## 2018-01-06 ENCOUNTER — Encounter: Payer: Medicare PPO | Admitting: Nurse Practitioner

## 2018-01-07 ENCOUNTER — Encounter: Payer: Self-pay | Admitting: Family Medicine

## 2018-01-07 ENCOUNTER — Ambulatory Visit: Payer: Medicare PPO | Admitting: Family Medicine

## 2018-01-07 VITALS — BP 110/80 | HR 97 | Resp 16 | Ht 68.0 in | Wt 264.1 lb

## 2018-01-07 DIAGNOSIS — Z1159 Encounter for screening for other viral diseases: Secondary | ICD-10-CM

## 2018-01-07 DIAGNOSIS — M5416 Radiculopathy, lumbar region: Secondary | ICD-10-CM | POA: Diagnosis not present

## 2018-01-07 DIAGNOSIS — G4762 Sleep related leg cramps: Secondary | ICD-10-CM | POA: Diagnosis not present

## 2018-01-07 DIAGNOSIS — Z114 Encounter for screening for human immunodeficiency virus [HIV]: Secondary | ICD-10-CM | POA: Diagnosis not present

## 2018-01-07 DIAGNOSIS — F411 Generalized anxiety disorder: Secondary | ICD-10-CM

## 2018-01-07 DIAGNOSIS — E782 Mixed hyperlipidemia: Secondary | ICD-10-CM | POA: Diagnosis not present

## 2018-01-07 DIAGNOSIS — Z789 Other specified health status: Secondary | ICD-10-CM | POA: Diagnosis not present

## 2018-01-07 DIAGNOSIS — R739 Hyperglycemia, unspecified: Secondary | ICD-10-CM | POA: Diagnosis not present

## 2018-01-07 DIAGNOSIS — F331 Major depressive disorder, recurrent, moderate: Secondary | ICD-10-CM | POA: Diagnosis not present

## 2018-01-07 MED ORDER — BUSPIRONE HCL 15 MG PO TABS: 15.0000 mg | ORAL_TABLET | Freq: Two times a day (BID) | ORAL | 1 refills | Status: DC

## 2018-01-07 MED ORDER — CITALOPRAM HYDROBROMIDE 40 MG PO TABS: 40.0000 mg | ORAL_TABLET | Freq: Every day | ORAL | 1 refills | Status: DC

## 2018-01-07 MED ORDER — BUPROPION HCL ER (XL) 300 MG PO TB24: 300.0000 mg | ORAL_TABLET | Freq: Every day | ORAL | 1 refills | Status: DC
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
## 2018-01-08 ENCOUNTER — Encounter: Payer: Self-pay | Admitting: Family Medicine

## 2018-01-08 DIAGNOSIS — R7303 Prediabetes: Secondary | ICD-10-CM

## 2018-01-08 HISTORY — DX: Prediabetes: R73.03

## 2018-01-08 LAB — CBC WITH DIFFERENTIAL/PLATELET
Basophils Absolute: 69 cells/uL (ref 0–200)
Basophils Relative: 0.9 %
EOS PCT: 1.8 %
Eosinophils Absolute: 139 cells/uL (ref 15–500)
HCT: 47.1 % (ref 38.5–50.0)
Hemoglobin: 16.3 g/dL (ref 13.2–17.1)
Lymphs Abs: 2803 cells/uL (ref 850–3900)
MCH: 30.6 pg (ref 27.0–33.0)
MCHC: 34.6 g/dL (ref 32.0–36.0)
MCV: 88.4 fL (ref 80.0–100.0)
MONOS PCT: 7.8 %
MPV: 10.6 fL (ref 7.5–12.5)
NEUTROS PCT: 53.1 %
Neutro Abs: 4089 cells/uL (ref 1500–7800)
Platelets: 284 10*3/uL (ref 140–400)
RBC: 5.33 10*6/uL (ref 4.20–5.80)
RDW: 12.4 % (ref 11.0–15.0)
TOTAL LYMPHOCYTE: 36.4 %
WBC mixed population: 601 cells/uL (ref 200–950)
WBC: 7.7 10*3/uL (ref 3.8–10.8)

## 2018-01-08 LAB — COMPLETE METABOLIC PANEL WITH GFR
AG RATIO: 2.2 (calc) (ref 1.0–2.5)
ALBUMIN MSPROF: 5 g/dL (ref 3.6–5.1)
ALT: 33 U/L (ref 9–46)
AST: 23 U/L (ref 10–35)
Alkaline phosphatase (APISO): 56 U/L (ref 40–115)
BILIRUBIN TOTAL: 0.6 mg/dL (ref 0.2–1.2)
BUN: 17 mg/dL (ref 7–25)
CHLORIDE: 103 mmol/L (ref 98–110)
CO2: 26 mmol/L (ref 20–32)
Calcium: 9.7 mg/dL (ref 8.6–10.3)
Creat: 1.04 mg/dL (ref 0.70–1.33)
GFR, EST AFRICAN AMERICAN: 93 mL/min/{1.73_m2} (ref >=60)
GFR, Est Non African American: 80 mL/min/{1.73_m2} (ref >=60)
Globulin: 2.3 g/dL (calc) (ref 1.9–3.7)
Glucose, Bld: 96 mg/dL (ref 65–139)
POTASSIUM: 4.9 mmol/L (ref 3.5–5.3)
Sodium: 136 mmol/L (ref 135–146)
TOTAL PROTEIN: 7.3 g/dL (ref 6.1–8.1)

## 2018-01-08 LAB — HEPATITIS C ANTIBODY
Hepatitis C Ab: NONREACTIVE
SIGNAL TO CUT-OFF: 0.07 (ref <1.00)

## 2018-01-08 LAB — LIPID PANEL
CHOLESTEROL: 249 mg/dL — AB (ref <200)
HDL: 43 mg/dL (ref >40)
LDL Cholesterol (Calc): 164 mg/dL (calc) — ABNORMAL HIGH
Non-HDL Cholesterol (Calc): 206 mg/dL (calc) — ABNORMAL HIGH (ref <130)
Total CHOL/HDL Ratio: 5.8 (calc) — ABNORMAL HIGH (ref <5.0)
Triglycerides: 255 mg/dL — ABNORMAL HIGH (ref <150)

## 2018-01-08 LAB — HEMOGLOBIN A1C
Hgb A1c MFr Bld: 5.8 % of total Hgb — ABNORMAL HIGH (ref <5.7)
Mean Plasma Glucose: 120 (calc)
eAG (mmol/L): 6.6 (calc)

## 2018-01-08 LAB — HIV ANTIBODY (ROUTINE TESTING W REFLEX): HIV: NONREACTIVE

## 2018-01-09 ENCOUNTER — Other Ambulatory Visit: Payer: Self-pay | Admitting: Family Medicine

## 2018-01-09 MED ORDER — ROSUVASTATIN CALCIUM 40 MG PO TABS: 40.0000 mg | ORAL_TABLET | Freq: Every day | ORAL | 0 refills | Status: DC

## 2018-01-09 NOTE — Progress Notes (Signed)
Increasing dose of statin

## 2018-01-15 ENCOUNTER — Encounter: Payer: Self-pay | Admitting: Nurse Practitioner

## 2018-01-15 ENCOUNTER — Other Ambulatory Visit: Payer: Self-pay

## 2018-01-15 ENCOUNTER — Ambulatory Visit: Payer: Medicare PPO | Attending: Nurse Practitioner | Admitting: Nurse Practitioner

## 2018-01-15 VITALS — BP 123/90 | HR 76 | Temp 98.3°F | Ht 68.0 in | Wt 264.0 lb

## 2018-01-15 DIAGNOSIS — Z6841 Body Mass Index (BMI) 40.0 and over, adult: Secondary | ICD-10-CM | POA: Insufficient documentation

## 2018-01-15 DIAGNOSIS — F411 Generalized anxiety disorder: Secondary | ICD-10-CM | POA: Insufficient documentation

## 2018-01-15 DIAGNOSIS — R7303 Prediabetes: Secondary | ICD-10-CM | POA: Diagnosis not present

## 2018-01-15 DIAGNOSIS — M47816 Spondylosis without myelopathy or radiculopathy, lumbar region: Secondary | ICD-10-CM | POA: Insufficient documentation

## 2018-01-15 DIAGNOSIS — Z87891 Personal history of nicotine dependence: Secondary | ICD-10-CM | POA: Insufficient documentation

## 2018-01-15 DIAGNOSIS — Z79891 Long term (current) use of opiate analgesic: Secondary | ICD-10-CM

## 2018-01-15 DIAGNOSIS — M4726 Other spondylosis with radiculopathy, lumbar region: Secondary | ICD-10-CM | POA: Diagnosis not present

## 2018-01-15 DIAGNOSIS — Z79899 Other long term (current) drug therapy: Secondary | ICD-10-CM | POA: Diagnosis not present

## 2018-01-15 DIAGNOSIS — F329 Major depressive disorder, single episode, unspecified: Secondary | ICD-10-CM | POA: Diagnosis not present

## 2018-01-15 DIAGNOSIS — G894 Chronic pain syndrome: Secondary | ICD-10-CM | POA: Diagnosis not present

## 2018-01-15 DIAGNOSIS — E785 Hyperlipidemia, unspecified: Secondary | ICD-10-CM | POA: Diagnosis not present

## 2018-01-15 DIAGNOSIS — M542 Cervicalgia: Secondary | ICD-10-CM | POA: Diagnosis not present

## 2018-01-15 DIAGNOSIS — M5416 Radiculopathy, lumbar region: Secondary | ICD-10-CM

## 2018-01-15 DIAGNOSIS — M545 Low back pain: Secondary | ICD-10-CM | POA: Diagnosis present

## 2018-01-15 MED ORDER — OXYCODONE-ACETAMINOPHEN 5-325 MG PO TABS: 1.0000 | ORAL_TABLET | ORAL | 0 refills | Status: DC | PRN

## 2018-01-15 MED ORDER — OXYCODONE-ACETAMINOPHEN 5-325 MG PO TABS
1.0000 | ORAL_TABLET | ORAL | 0 refills | Status: DC | PRN
Start: 2018-01-19 — End: 2018-03-26

## 2018-01-15 MED ORDER — IBUPROFEN 800 MG PO TABS: 800.0000 mg | ORAL_TABLET | Freq: Every day | ORAL | 1 refills | Status: AC | PRN

## 2018-01-15 MED ORDER — TIZANIDINE HCL 4 MG PO TABS: 4.0000 mg | ORAL_TABLET | Freq: Two times a day (BID) | ORAL | 1 refills | Status: AC | PRN

## 2018-01-15 NOTE — Progress Notes (Signed)
Patient's Name: John Garrett  MRN: [id number]  Referring Provider: Roselee Nova, MD  DOB: 03/16/1961  PCP: Steele Sizer, MD  DOS: 01/15/2018  Note by: Vevelyn Francois NP  Service setting: Ambulatory outpatient  Specialty: Interventional Pain Management  Location: [facility] (AMB) Pain Management Facility    Patient type: Established    Primary Reason(s) for Visit: Encounter for prescription drug management & post-procedure evaluation of chronic illness with mild to moderate exacerbation(Level of risk: moderate) CC: Back Pain (lower)  HPI  [name] is a 57 y.o. year old, male patient, who comes today for a post-procedure evaluation and medication management. He has Lumbar radiculopathy, chronic; Continuous opioid dependence ([place]); GAD (generalized anxiety disorder); Major depression, chronic; Cervical pain; Morbid obesity ([place]); Dyslipidemia; Polypharmacy; Nocturnal leg cramps; Prediabetes; Chronic pain syndrome; and Lumbar spondylosis on their problem list. His primarily concern today is the Back Pain (lower)  Pain Assessment: Location: Lower Back Radiating: radiates down tailbone Onset: More than a month ago Duration: Chronic pain Quality: Throbbing Severity: 6 /10 (self-reported pain score)  Note: Reported level is compatible with observation. Clinically the patient looks like a 2/10 A 2/10 is viewed as "Mild to Moderate" and described as noticeable and distracting. Impossible to hide from other people. More frequent flare-ups. Still possible to adapt and function close to normal. It can be very annoying and may have occasional stronger flare-ups. With discipline, patients may get used to it and adapt. Information on the proper use of the pain scale provided to the patient today. When using our objective Pain Scale, levels between 6 and 10/10 are said to belong in an emergency room, as it progressively worsens from a 6/10, described as severely limiting, requiring emergency care not usually  available at an outpatient pain management facility. At a 6/10 level, communication becomes difficult and requires great effort. Assistance to reach the emergency department may be required. Facial flushing and profuse sweating along with potentially dangerous increases in heart rate and blood pressure will be evident. Effect on ADL:   Timing: Intermittent Modifying factors: hot shower and rest, meds  [name] was last seen on Visit date not found for a procedure. During today's appointment we reviewed [name] post-procedure results, as well as his outpatient medication regimen. He is SP right LESI He states that his first injection was effective for leg pain. He states that the last one was not effective. He denies any leg pain today. He admits that he still gets cramps.   Further details on both, my assessment(s), as well as the proposed treatment plan, please see below.  Controlled Substance Pharmacotherapy Assessment REMS (Risk Evaluation and Mitigation Strategy)  Analgesic:Oxycodone 5 mg up to 6 times a day as needed for chronic pain. MME/day:Approximately 45-31m/day.    [name] RN  01/15/2018 10:06 AM  Sign at close encounter Nursing Pain Medication Assessment:  Safety precautions to be maintained throughout the outpatient stay will include: orient to surroundings, keep bed in low position, maintain call bell within reach at all times, provide assistance with transfer out of bed and ambulation.  Medication Inspection Compliance: Pill count conducted under aseptic conditions, in front of the patient. Neither the pills nor the bottle was removed from the patient's sight at any time. Once count was completed pills were immediately returned to the patient in their original bottle.  Medication: Oxycodone/APAP Pill/Patch Count: 32 of 180 pills remain Pill/Patch Appearance: Markings consistent with prescribed medication Bottle Appearance: Standard pharmacy container. Clearly  labeled. Filled Date: 03 / 22 / 2019 Last Medication intake:  Today   Pharmacokinetics: Liberation and absorption (onset of action): WNL Distribution (time to peak effect): WNL Metabolism and excretion (duration of action): WNL         Pharmacodynamics: Desired effects: Analgesia: [name] reports >50% benefit. Functional ability: Patient reports that medication allows him to accomplish basic ADLs Clinically meaningful improvement in function (CMIF): Sustained CMIF goals met Perceived effectiveness: Described as relatively effective, allowing for increase in activities of daily living (ADL) Undesirable effects: Side-effects or Adverse reactions: None reported Monitoring: [place] PMP: Online review of the past 61-monthperiod conducted. Compliant with practice rules and regulations Last UDS on record: Summary  Date Value Ref Range Status  07/30/2017 FINAL  Final    Comment:    ==================================================================== TOXASSURE COMP DRUG ANALYSIS,UR ==================================================================== Test                             Result       Flag       Units Drug Present and Declared for Prescription Verification   Oxycodone                      708          EXPECTED   ng/mg creat   Noroxycodone                   803          EXPECTED   ng/mg creat    Sources of oxycodone include scheduled prescription medications.    Noroxycodone is an expected metabolite of oxycodone.   Tizanidine                     PRESENT      EXPECTED   Bupropion                      PRESENT      EXPECTED   Hydroxybupropion               PRESENT      EXPECTED    Hydroxybupropion is an expected metabolite of bupropion.   Citalopram                     PRESENT      EXPECTED   Desmethylcitalopram            PRESENT      EXPECTED    Desmethylcitalopram is an expected metabolite of citalopram or    the enantiomeric form, escitalopram.   Acetaminophen                   PRESENT      EXPECTED   Ibuprofen                      PRESENT      EXPECTED Drug Absent but Declared for Prescription Verification   Alprazolam                     Not Detected UNEXPECTED ng/mg creat   Orphenadrine                   Not Detected UNEXPECTED ==================================================================== Test                      Result    Flag  Units      Ref Range   Creatinine              214              mg/dL      >=20 ==================================================================== Declared Medications:  The flagging and interpretation on this report are based on the  following declared medications.  Unexpected results may arise from  inaccuracies in the declared medications.  **Note: The testing scope of this panel includes these medications:  Alprazolam  Bupropion  Citalopram  Orphenadrine (Norflex)  Oxycodone (Oxycodone Acetaminophen)  **Note: The testing scope of this panel does not include small to  moderate amounts of these reported medications:  Acetaminophen (Oxycodone Acetaminophen)  Ibuprofen  Tizanidine  **Note: The testing scope of this panel does not include following  reported medications:  Buspirone  Rosuvastatin (Crestor) ==================================================================== For clinical consultation, please call [phone]. ====================================================================    UDS interpretation: Compliant          Medication Assessment Form: Reviewed. Patient indicates being compliant with therapy Treatment compliance: Compliant Risk Assessment Profile: Aberrant behavior: See prior evaluations. None observed or detected today Comorbid factors increasing risk of overdose: See prior notes. No additional risks detected today Risk of substance use disorder (SUD): Low  ORT Scoring interpretation table:  Score <3 = Low Risk for SUD  Score between 4-7 = Moderate Risk for SUD  Score >8 = High  Risk for Opioid Abuse   Risk Mitigation Strategies:  Patient Counseling: Covered Patient-Prescriber Agreement (PPA): Present and active  Notification to other healthcare providers: Done  Pharmacologic Plan: No change in therapy, at this time.             Post-Procedure Assessment  11/27/17 Procedure: Right LESI Pre-procedure pain score:  8/10 Post-procedure pain score: 3/10         Influential Factors: BMI: 40.14 kg/m Intra-procedural challenges: None observed.         Assessment challenges: None detected.              Reported side-effects: None.        Post-procedural adverse reactions or complications: None reported         Sedation: Please see nurses note. When no sedatives are used, the analgesic levels obtained are directly associated to the effectiveness of the local anesthetics. However, when sedation is provided, the level of analgesia obtained during the initial 1 hour following the intervention, is believed to be the result of a combination of factors. These factors may include, but are not limited to: 1. The effectiveness of the local anesthetics used. 2. The effects of the analgesic(s) and/or anxiolytic(s) used. 3. The degree of discomfort experienced by the patient at the time of the procedure. 4. The patients ability and reliability in recalling and recording the events. 5. The presence and influence of possible secondary gains and/or psychosocial factors. Reported result: Relief experienced during the 1st hour after the procedure: 100 % (Ultra-Short Term Relief)            Interpretative annotation: Clinically appropriate result. Analgesia during this period is likely to be Local Anesthetic and/or IV Sedative (Analgesic/Anxiolytic) related.          Effects of local anesthetic: The analgesic effects attained during this period are directly associated to the localized infiltration of local anesthetics and therefore cary significant diagnostic value as to the etiological  location, or anatomical origin, of the pain. Expected duration of relief is directly dependent on the  pharmacodynamics of the local anesthetic used. Long-acting (4-6 hours) anesthetics used.  Reported result: Relief during the next 4 to 6 hour after the procedure: 80 % (Short-Term Relief)            Interpretative annotation: Clinically appropriate result. Analgesia during this period is likely to be Local Anesthetic-related.          Long-term benefit: Defined as the period of time past the expected duration of local anesthetics (1 hour for short-acting and 4-6 hours for long-acting). With the possible exception of prolonged sympathetic blockade from the local anesthetics, benefits during this period are typically attributed to, or associated with, other factors such as analgesic sensory neuropraxia, antiinflammatory effects, or beneficial biochemical changes provided by agents other than the local anesthetics.  Reported result: Extended relief following procedure: 20 % (Long-Term Relief)            Interpretative annotation: Clinically appropriate result. Good relief. No permanent benefit expected. Inflammation plays a part in the etiology to the pain.          Current benefits: Defined as reported results that persistent at this point in time.   Analgesia: <25 %            Function: Back to baseline ROM: Back to baseline Interpretative annotation: Recurrence of symptoms. No permanent benefit expected. Effective diagnostic intervention.          Interpretation: Results would suggest a successful diagnostic intervention.                  Plan:  Please see "Plan of Care" for details.                Laboratory Chemistry  Inflammation Markers (CRP: Acute Phase) (ESR: Chronic Phase) No results found for: CRP, ESRSEDRATE, LATICACIDVEN                       Rheumatology Markers No results found for: RF, ANA, Therisa [place], [facility]                      Renal Function  Markers Lab Results  Component Value Date   BUN 17 01/07/2018   CREATININE 1.04 01/07/2018   GFRAA 93 01/07/2018   GFRNONAA 80 01/07/2018                              Hepatic Function Markers Lab Results  Component Value Date   AST 23 01/07/2018   ALT 33 01/07/2018                        Electrolytes Lab Results  Component Value Date   NA 136 01/07/2018   K 4.9 01/07/2018   CL 103 01/07/2018   CALCIUM 9.7 01/07/2018                        Neuropathy Markers Lab Results  Component Value Date   HGBA1C 5.8 (H) 01/07/2018   HIV NON-REACTIVE 01/07/2018                        Bone Pathology Markers No results found for: [place], [id number], [id number], [id number], 25OHVITD1, 25OHVITD2, 25OHVITD3, TESTOFREE, TESTOSTERONE                       Coagulation Parameters Lab Results  Component Value Date   PLT 284 01/07/2018                        Cardiovascular Markers Lab Results  Component Value Date   HGB 16.3 01/07/2018   HCT 47.1 01/07/2018                         CA Markers No results found for: CEA, CA125, LABCA2                      Note: Lab results reviewed.  Recent Diagnostic Imaging Results  DG C-Arm 1-60 Min-No Report Fluoroscopy was utilized by the requesting physician.  No radiographic  interpretation.   Complexity Note: Imaging results reviewed. Results shared with Mr. Kynard, using Layman's terms.                         Meds   Current Outpatient Medications:  .  buPROPion (WELLBUTRIN XL) 300 MG 24 hr tablet, Take 1 tablet (300 mg total) by mouth daily., Disp: 90 tablet, Rfl: 1 .  busPIRone (BUSPAR) 15 MG tablet, Take 1 tablet (15 mg total) by mouth 2 (two) times daily., Disp: 180 tablet, Rfl: 1 .  citalopram (CELEXA) 40 MG tablet, Take 1 tablet (40 mg total) by mouth daily., Disp: 90 tablet, Rfl: 1 .  [START ON 01/19/2018] ibuprofen (ADVIL,MOTRIN) 800 MG tablet, Take 1 tablet (800 mg total) by mouth daily as needed., Disp: 90 tablet, Rfl: 1 .   [START ON 02/18/2018] oxyCODONE-acetaminophen (PERCOCET/ROXICET) 5-325 MG tablet, Take 1 tablet by mouth every 4 (four) hours as needed for severe pain., Disp: 180 tablet, Rfl: 0 .  rosuvastatin (CRESTOR) 40 MG tablet, Take 1 tablet (40 mg total) by mouth daily., Disp: 90 tablet, Rfl: 0 .  [START ON 01/19/2018] tiZANidine (ZANAFLEX) 4 MG tablet, Take 1 tablet (4 mg total) by mouth 2 (two) times daily as needed for muscle spasms., Disp: 60 tablet, Rfl: 1 .  [START ON 01/19/2018] oxyCODONE-acetaminophen (PERCOCET) 5-325 MG tablet, Take 1 tablet by mouth every 4 (four) hours as needed for severe pain., Disp: 180 tablet, Rfl: 0  ROS  Constitutional: Denies any fever or chills Gastrointestinal: No reported hemesis, hematochezia, vomiting, or acute GI distress Musculoskeletal: Denies any acute onset joint swelling, redness, loss of ROM, or weakness Neurological: No reported episodes of acute onset apraxia, aphasia, dysarthria, agnosia, amnesia, paralysis, loss of coordination, or loss of consciousness  Allergies  Mr. Humble has No Known Allergies.  [place]  Drug: Mr. Cadieux  reports that he does not use drugs. Alcohol:  reports that he drinks about 7.2 oz of alcohol per week. Tobacco:  reports that he quit smoking about 5 years ago. His smoking use included cigarettes. He has a 20.00 pack-year smoking history. He quit smokeless tobacco use about 17 years ago. His smokeless tobacco use included chew. Medical:  has a past medical history of Anxiety, Chronic back pain, Chronic pain, Depression, Hyperlipidemia, and Prediabetes (01/08/2018). Surgical: Mr. Hemmer  has a past surgical history that includes Cataract extraction (Right, 2014). Family: Family history is unknown by patient.  Constitutional Exam  General appearance: Well nourished, well developed, and well hydrated. In no apparent acute distress Vitals:   01/15/18 1006  BP: 123/90  Pulse: 76  Temp: 98.3 F (36.8 C)  SpO2: 98%  Weight: 264 lb  (119.7 kg)  Height: 5' 8"  (1.727  m)  Psych/Mental status: Alert, oriented x 3 (person, place, & time)       Eyes: PERLA Respiratory: No evidence of acute respiratory distress  Lumbar Spine Area Exam  Skin & Axial Inspection: No masses, redness, or swelling Alignment: Symmetrical Functional ROM: Unrestricted ROM       Stability: No instability detected Muscle Tone/Strength: Functionally intact. No obvious neuro-muscular anomalies detected. Sensory (Neurological): Unimpaired Palpation: Complains of area being tender to palpation       Provocative Tests: Lumbar Hyperextension and rotation test: Positive       Lumbar Lateral bending test: Negative       Patrick's Maneuver: evaluation deferred today                    Gait & Posture Assessment  Ambulation: Unassisted Gait: Relatively normal for age and body habitus Posture: WNL   Lower Extremity Exam    Side: Right lower extremity  Side: Left lower extremity  Skin & Extremity Inspection: Skin color, temperature, and hair growth are WNL. No peripheral edema or cyanosis. No masses, redness, swelling, asymmetry, or associated skin lesions. No contractures.  Skin & Extremity Inspection: Skin color, temperature, and hair growth are WNL. No peripheral edema or cyanosis. No masses, redness, swelling, asymmetry, or associated skin lesions. No contractures.  Functional ROM: Unrestricted ROM          Functional ROM: Unrestricted ROM          Muscle Tone/Strength: Functionally intact. No obvious neuro-muscular anomalies detected.  Muscle Tone/Strength: Functionally intact. No obvious neuro-muscular anomalies detected.  Sensory (Neurological): Unimpaired  Sensory (Neurological): Unimpaired  Palpation: No palpable anomalies  Palpation: No palpable anomalies   Assessment  Primary Diagnosis & Pertinent Problem List: The primary encounter diagnosis was Lumbar spondylosis. Diagnoses of Lumbar radiculopathy, chronic, Chronic pain syndrome, and Long term  prescription opiate use were also pertinent to this visit.  Status Diagnosis  Controlled Controlled Controlled 1. Lumbar spondylosis   2. Lumbar radiculopathy, chronic   3. Chronic pain syndrome   4. Long term prescription opiate use     Problems updated and reviewed during this visit: Problem  Chronic Pain Syndrome  Lumbar Spondylosis   Plan of Care  Pharmacotherapy (Medications Ordered): Meds ordered this encounter  Medications  . ibuprofen (ADVIL,MOTRIN) 800 MG tablet    Sig: Take 1 tablet (800 mg total) by mouth daily as needed.    Dispense:  90 tablet    Refill:  1    Order Specific Question:   Supervising Provider    Answer:   Milinda Pointer [phone]  . tiZANidine (ZANAFLEX) 4 MG tablet    Sig: Take 1 tablet (4 mg total) by mouth 2 (two) times daily as needed for muscle spasms.    Dispense:  60 tablet    Refill:  1    Order Specific Question:   Supervising Provider    Answer:   Milinda Pointer [phone]  . oxyCODONE-acetaminophen (PERCOCET) 5-325 MG tablet    Sig: Take 1 tablet by mouth every 4 (four) hours as needed for severe pain.    Dispense:  180 tablet    Refill:  0    Do not place this medication, or any other prescription from our practice, on "Automatic Refill". Patient may have prescription filled one day early if pharmacy is closed on scheduled refill date. Do not fill until: 01/19/2018 To last until:02/18/2018    Order Specific Question:   Supervising Provider    Answer:  Grub[place], [place] oxyCODONE-acetaminophen (PERCOCET/ROXICET) 5-325 MG tablet    Sig: Take 1 tablet by mouth every 4 (four) hours as needed for severe pain.    Dispense:  180 tablet    Refill:  0    For chronic pain DNF : 02/18/2018 To last for 30 day from fill date Can fill 1 day earlier if pharmacy closed on fill date    Order Specific Question:   Supervising Provider    Answer:   Milinda Pointer [[id number]]   New Prescriptions   OXYCODONE-ACETAMINOPHEN  (PERCOCET) 5-325 MG TABLET    Take 1 tablet by mouth every 4 (four) hours as needed for severe pain.   Medications administered today: Buren Kos had no medications administered during this visit. Lab-work, procedure(s), and/or referral(s): Orders Placed This Encounter  Procedures  . ToxASSURE Select 13 (MW), Urine   Imaging and/or referral(s): None  Interventional therapies: Planned, scheduled, and/or pending:   Not at this time. He desires to hold off on any additional steroid injections secondary to weight gain. He will start aqua therapy   Provider-requested follow-up: Return in about 2 months (around 03/17/2018) for MedMgmt with Me Donella Stade [facility] Pace).  Future Appointments  Date Time Provider [place]  03/13/2018 11:30 AM Vevelyn Francois, NP [facility] None  04/11/2018 11:40 AM Steele Sizer, MD [facility]   Primary Care Physician: Steele Sizer, MD Location: [facility] Management [facility] Note by: Vevelyn Francois NP Date: 01/15/2018; Time: 12:38 PM  Pain Score Disclaimer: We use the NRS-11 scale. This is a self-reported, subjective measurement of pain severity with only modest accuracy. It is used primarily to identify changes within a particular patient. It must be understood that outpatient pain scales are significantly less accurate that those used for research, where they can be applied under ideal controlled circumstances with minimal exposure to variables. In reality, the score is likely to be a combination of pain intensity and pain affect, where pain affect describes the degree of emotional arousal or changes in action readiness caused by the sensory experience of pain. Factors such as social and work situation, setting, emotional state, anxiety levels, expectation, and prior pain experience may influence pain perception and show large inter-individual differences that may also be affected by time variables.  Patient instructions provided during this  appointment: Patient Instructions   ____________________________________________________________________________________________  Medication Rules  Applies to: All patients receiving prescriptions (written or electronic).  Pharmacy of record: Pharmacy where electronic prescriptions will be sent. If written prescriptions are taken to a different pharmacy, please inform the nursing staff. The pharmacy listed in the electronic medical record should be the one where you would like electronic prescriptions to be sent.  Prescription refills: Only during scheduled appointments. Applies to both, written and electronic prescriptions.  NOTE: The following applies primarily to controlled substances (Opioid* Pain Medications).   Patient's responsibilities: 1. Pain Pills: Bring all pain pills to every appointment (except for procedure appointments). 2. Pill Bottles: Bring pills in original pharmacy bottle. Always bring newest bottle. Bring bottle, even if empty. 3. Medication refills: You are responsible for knowing and keeping track of what medications you need refilled. The day before your appointment, write a list of all prescriptions that need to be refilled. Bring that list to your appointment and give it to the admitting nurse. Prescriptions will be written only during appointments. If you forget a medication, it will not be "Called in", "Faxed", or "electronically sent". You will need to get another appointment  to get these prescribed. 4. Prescription Accuracy: You are responsible for carefully inspecting your prescriptions before leaving our office. Have the discharge nurse carefully go over each prescription with you, before taking them home. Make sure that your name is accurately spelled, that your address is correct. Check the name and dose of your medication to make sure it is accurate. Check the number of pills, and the written instructions to make sure they are clear and accurate. Make sure that  you are given enough medication to last until your next medication refill appointment. 5. Taking Medication: Take medication as prescribed. Never take more pills than instructed. Never take medication more frequently than prescribed. Taking less pills or less frequently is permitted and encouraged, when it comes to controlled substances (written prescriptions).  6. Inform other Doctors: Always inform, all of your healthcare providers, of all the medications you take. 7. Pain Medication from other Providers: You are not allowed to accept any additional pain medication from any other Doctor or Healthcare provider. There are two exceptions to this rule. (see below) In the event that you require additional pain medication, you are responsible for notifying us, as stated below. 8. Medication Agreement: You are responsible for carefully reading and following our Medication Agreement. This must be signed before receiving any prescriptions from our practice. Safely store a copy of your signed Agreement. Violations to the Agreement will result in no further prescriptions. (Additional copies of our Medication Agreement are available upon request.) 9. Laws, Rules, & Regulations: All patients are expected to follow all Federal and [employer], [employer], Rules, [employer]. Ignorance of the Laws does not constitute a valid excuse. The use of any illegal substances is prohibited. 10. Adopted CDC guidelines & recommendations: Target dosing levels will be at or below 60 MME/day. Use of benzodiazepines** is not recommended.  Exceptions: There are only two exceptions to the rule of not receiving pain medications from other Healthcare Providers. 1. Exception #1 (Emergencies): In the event of an emergency (i.e.: accident requiring emergency care), you are allowed to receive additional pain medication. However, you are responsible for: As soon as you are able, call our office [phone], at any time of the day or night,  and leave a message stating your name, the date and nature of the emergency, and the name and dose of the medication prescribed. In the event that your call is answered by a member of our staff, make sure to document and save the date, time, and the name of the person that took your information.  2. Exception #2 (Planned Surgery): In the event that you are scheduled by another doctor or dentist to have any type of surgery or procedure, you are allowed (for a period no longer than 30 days), to receive additional pain medication, for the acute post-op pain. However, in this case, you are responsible for picking up a copy of our "Post-op Pain Management for Surgeons" handout, and giving it to your surgeon or dentist. This document is available at our office, and does not require an appointment to obtain it. Simply go to our office during business hours (Monday-Thursday from 8:00 AM to 4:00 PM) (Friday 8:00 AM to 12:00 Noon) or if you have a scheduled appointment with [place], prior to your surgery, and ask for it by name. In addition, you will need to provide [place] with your name, name of your surgeon, type of surgery, and date of procedure or surgery.  *Opioid medications include: morphine, codeine, oxycodone, oxymorphone,  hydrocodone, hydromorphone, meperidine, tramadol, tapentadol, buprenorphine, fentanyl, methadone. **Benzodiazepine medications include: diazepam (Valium), alprazolam (Xanax), clonazepam (Klonopine), lorazepam (Ativan), clorazepate (Tranxene), chlordiazepoxide (Librium), estazolam (Prosom), oxazepam (Serax), temazepam (Restoril), triazolam (Halcion) (Last updated: 11/28/2017) ____________________________________________________________________________________________   ____________________________________________________________________________________________  Pain Scale  Introduction: The pain score used by this practice is the Verbal Numerical Rating Scale (VNRS-11). This is an 11-point  scale. It is for adults and children 10 years or older. There are significant differences in how the pain score is reported, used, and applied. Forget everything you learned in the past and learn this scoring system.  General Information: The scale should reflect your current level of pain. Unless you are specifically asked for the level of your worst pain, or your average pain. If you are asked for one of these two, then it should be understood that it is over the past 24 hours.  Basic Activities of Daily Living (ADL): Personal hygiene, dressing, eating, transferring, and using restroom.  Instructions: Most patients tend to report their level of pain as a combination of two factors, their physical pain and their psychosocial pain. This last one is also known as "suffering" and it is reflection of how physical pain affects you socially and psychologically. From now on, report them separately. From this point on, when asked to report your pain level, report only your physical pain. Use the following table for reference.  Pain Clinic Pain Levels (0-5/10)  Pain Level Score  Description  No Pain 0   Mild pain 1 Nagging, annoying, but does not interfere with basic activities of daily living (ADL). Patients are able to eat, bathe, get dressed, toileting (being able to get on and off the toilet and perform personal hygiene functions), transfer (move in and out of bed or a chair without assistance), and maintain continence (able to control bladder and bowel functions). Blood pressure and heart rate are unaffected. A normal heart rate for a healthy adult ranges from 60 to 100 bpm (beats per minute).   Mild to moderate pain 2 Noticeable and distracting. Impossible to hide from other people. More frequent flare-ups. Still possible to adapt and function close to normal. It can be very annoying and may have occasional stronger flare-ups. With discipline, patients may get used to it and adapt.   Moderate pain 3  Interferes significantly with activities of daily living (ADL). It becomes difficult to feed, bathe, get dressed, get on and off the toilet or to perform personal hygiene functions. Difficult to get in and out of bed or a chair without assistance. Very distracting. With effort, it can be ignored when deeply involved in activities.   Moderately severe pain 4 Impossible to ignore for more than a few minutes. With effort, patients may still be able to manage work or participate in some social activities. Very difficult to concentrate. Signs of autonomic nervous system discharge are evident: dilated pupils (mydriasis); mild sweating (diaphoresis); sleep interference. Heart rate becomes elevated (>115 bpm). Diastolic blood pressure (lower number) rises above 100 mmHg. Patients find relief in laying down and not moving.   Severe pain 5 Intense and extremely unpleasant. Associated with frowning face and frequent crying. Pain overwhelms the senses.  Ability to do any activity or maintain social relationships becomes significantly limited. Conversation becomes difficult. Pacing back and forth is common, as getting into a comfortable position is nearly impossible. Pain wakes you up from deep sleep. Physical signs will be obvious: pupillary dilation; increased sweating; goosebumps; brisk reflexes; cold, clammy hands and feet; nausea,  vomiting or dry heaves; loss of appetite; significant sleep disturbance with inability to fall asleep or to remain asleep. When persistent, significant weight loss is observed due to the complete loss of appetite and sleep deprivation.  Blood pressure and heart rate becomes significantly elevated. Caution: If elevated blood pressure triggers a pounding headache associated with blurred vision, then the patient should immediately seek attention at an urgent or emergency care unit, as these may be signs of an impending stroke.    Emergency Department Pain Levels (6-10/10)  Emergency Room  Pain 6 Severely limiting. Requires emergency care and should not be seen or managed at an outpatient pain management facility. Communication becomes difficult and requires great effort. Assistance to reach the emergency department may be required. Facial flushing and profuse sweating along with potentially dangerous increases in heart rate and blood pressure will be evident.   Distressing pain 7 Self-care is very difficult. Assistance is required to transport, or use restroom. Assistance to reach the emergency department will be required. Tasks requiring coordination, such as bathing and getting dressed become very difficult.   Disabling pain 8 Self-care is no longer possible. At this level, pain is disabling. The individual is unable to do even the most "basic" activities such as walking, eating, bathing, dressing, transferring to a bed, or toileting. Fine motor skills are lost. It is difficult to think clearly.   Incapacitating pain 9 Pain becomes incapacitating. Thought processing is no longer possible. Difficult to remember your own name. Control of movement and coordination are lost.   The worst pain imaginable 10 At this level, most patients pass out from pain. When this level is reached, collapse of the autonomic nervous system occurs, leading to a sudden drop in blood pressure and heart rate. This in turn results in a temporary and dramatic drop in blood flow to the brain, leading to a loss of consciousness. Fainting is one of the body's self defense mechanisms. Passing out puts the brain in a calmed state and causes it to shut down for a while, in order to begin the healing process.    Summary: 1. Refer to this scale when providing [place] with your pain level. 2. Be accurate and careful when reporting your pain level. This will help with your care. 3. Over-reporting your pain level will lead to loss of credibility. 4. Even a level of 1/10 means that there is pain and will be treated at our  facility. 5. High, inaccurate reporting will be documented as "Symptom Exaggeration", leading to loss of credibility and suspicions of possible secondary gains such as obtaining more narcotics, or wanting to appear disabled, for fraudulent reasons. 6. Only pain levels of 5 or below will be seen at our facility. 7. Pain levels of 6 and above will be sent to the Emergency Department and the appointment cancelled. ____________________________________________________________________________________________   BMI Assessment: Estimated body mass index is 40.14 kg/m as calculated from the following:   Height as of this encounter: 5' 8"  (1.727 m).   Weight as of this encounter: 264 lb (119.7 kg).  BMI interpretation table: BMI level Category Range association with higher incidence of chronic pain  <18 kg/m2 Underweight   18.5-24.9 kg/m2 Ideal body weight   25-29.9 kg/m2 Overweight Increased incidence by 20%  30-34.9 kg/m2 Obese (Class I) Increased incidence by 68%  35-39.9 kg/m2 Severe obesity (Class II) Increased incidence by 136%  >40 kg/m2 Extreme obesity (Class III) Increased incidence by 254%   BMI Readings from Last 4 Encounters:  01/15/18 40.14 kg/m  01/07/18 40.16 kg/m  11/27/17 34.97 kg/m  11/13/17 39.53 kg/m   Wt Readings from Last 4 Encounters:  01/15/18 264 lb (119.7 kg)  01/07/18 264 lb 1.6 oz (119.8 kg)  11/27/17 230 lb (104.3 kg)  11/13/17 260 lb (117.9 kg)

## 2018-01-15 NOTE — Patient Instructions (Addendum)
____________________________________________________________________________________________  Medication Rules  Applies to: All patients receiving prescriptions (written or electronic).  Pharmacy of record: Pharmacy where electronic prescriptions will be sent. If written prescriptions are taken to a different pharmacy, please inform the nursing staff. The pharmacy listed in the electronic medical record should be the one where you would like electronic prescriptions to be sent.  Prescription refills: Only during scheduled appointments. Applies to both, written and electronic prescriptions.  NOTE: The following applies primarily to controlled substances (Opioid* Pain Medications).   Patient's responsibilities: 1. Pain Pills: Bring all pain pills to every appointment (except for procedure appointments). 2. Pill Bottles: Bring pills in original pharmacy bottle. Always bring newest bottle. Bring bottle, even if empty. 3. Medication refills: You are responsible for knowing and keeping track of what medications you need refilled. The day before your appointment, write a list of all prescriptions that need to be refilled. Bring that list to your appointment and give it to the admitting nurse. Prescriptions will be written only during appointments. If you forget a medication, it will not be "Called in", "Faxed", or "electronically sent". You will need to get another appointment to get these prescribed. 4. Prescription Accuracy: You are responsible for carefully inspecting your prescriptions before leaving our office. Have the discharge nurse carefully go over each prescription with you, before taking them home. Make sure that your name is accurately spelled, that your address is correct. Check the name and dose of your medication to make sure it is accurate. Check the number of pills, and the written instructions to make sure they are clear and accurate. Make sure that you are given enough medication to last  until your next medication refill appointment. 5. Taking Medication: Take medication as prescribed. Never take more pills than instructed. Never take medication more frequently than prescribed. Taking less pills or less frequently is permitted and encouraged, when it comes to controlled substances (written prescriptions).  6. Inform other Doctors: Always inform, all of your healthcare providers, of all the medications you take. 7. Pain Medication from other Providers: You are not allowed to accept any additional pain medication from any other Doctor or Healthcare provider. There are two exceptions to this rule. (see below) In the event that you require additional pain medication, you are responsible for notifying us, as stated below. 8. Medication Agreement: You are responsible for carefully reading and following our Medication Agreement. This must be signed before receiving any prescriptions from our practice. Safely store a copy of your signed Agreement. Violations to the Agreement will result in no further prescriptions. (Additional copies of our Medication Agreement are available upon request.) 9. Laws, Rules, & Regulations: All patients are expected to follow all Federal and State Laws, Statutes, Rules, & Regulations. Ignorance of the Laws does not constitute a valid excuse. The use of any illegal substances is prohibited. 10. Adopted CDC guidelines & recommendations: Target dosing levels will be at or below 60 MME/day. Use of benzodiazepines** is not recommended.  Exceptions: There are only two exceptions to the rule of not receiving pain medications from other Healthcare Providers. 1. Exception #1 (Emergencies): In the event of an emergency (i.e.: accident requiring emergency care), you are allowed to receive additional pain medication. However, you are responsible for: As soon as you are able, call our office [phone], at any time of the day or night, and leave a message stating your name, the  date and nature of the emergency, and the name and dose of the medication   prescribed. In the event that your call is answered by a member of our staff, make sure to document and save the date, time, and the name of the person that took your information.  2. Exception #2 (Planned Surgery): In the event that you are scheduled by another doctor or dentist to have any type of surgery or procedure, you are allowed (for a period no longer than 30 days), to receive additional pain medication, for the acute post-op pain. However, in this case, you are responsible for picking up a copy of our "Post-op Pain Management for Surgeons" handout, and giving it to your surgeon or dentist. This document is available at our office, and does not require an appointment to obtain it. Simply go to our office during business hours (Monday-Thursday from 8:00 AM to 4:00 PM) (Friday 8:00 AM to 12:00 Noon) or if you have a scheduled appointment with us, prior to your surgery, and ask for it by name. In addition, you will need to provide us with your name, name of your surgeon, type of surgery, and date of procedure or surgery.  *Opioid medications include: morphine, codeine, oxycodone, oxymorphone, hydrocodone, hydromorphone, meperidine, tramadol, tapentadol, buprenorphine, fentanyl, methadone. **Benzodiazepine medications include: diazepam (Valium), alprazolam (Xanax), clonazepam (Klonopine), lorazepam (Ativan), clorazepate (Tranxene), chlordiazepoxide (Librium), estazolam (Prosom), oxazepam (Serax), temazepam (Restoril), triazolam (Halcion) (Last updated: 11/28/2017) ____________________________________________________________________________________________   ____________________________________________________________________________________________  Pain Scale  Introduction: The pain score used by this practice is the Verbal Numerical Rating Scale (VNRS-11). This is an 11-point scale. It is for adults and children 10 years or  older. There are significant differences in how the pain score is reported, used, and applied. Forget everything you learned in the past and learn this scoring system.  General Information: The scale should reflect your current level of pain. Unless you are specifically asked for the level of your worst pain, or your average pain. If you are asked for one of these two, then it should be understood that it is over the past 24 hours.  Basic Activities of Daily Living (ADL): Personal hygiene, dressing, eating, transferring, and using restroom.  Instructions: Most patients tend to report their level of pain as a combination of two factors, their physical pain and their psychosocial pain. This last one is also known as "suffering" and it is reflection of how physical pain affects you socially and psychologically. From now on, report them separately. From this point on, when asked to report your pain level, report only your physical pain. Use the following table for reference.  Pain Clinic Pain Levels (0-5/10)  Pain Level Score  Description  No Pain 0   Mild pain 1 Nagging, annoying, but does not interfere with basic activities of daily living (ADL). Patients are able to eat, bathe, get dressed, toileting (being able to get on and off the toilet and perform personal hygiene functions), transfer (move in and out of bed or a chair without assistance), and maintain continence (able to control bladder and bowel functions). Blood pressure and heart rate are unaffected. A normal heart rate for a healthy adult ranges from 60 to 100 bpm (beats per minute).   Mild to moderate pain 2 Noticeable and distracting. Impossible to hide from other people. More frequent flare-ups. Still possible to adapt and function close to normal. It can be very annoying and may have occasional stronger flare-ups. With discipline, patients may get used to it and adapt.   Moderate pain 3 Interferes significantly with activities of daily  living (ADL).   It becomes difficult to feed, bathe, get dressed, get on and off the toilet or to perform personal hygiene functions. Difficult to get in and out of bed or a chair without assistance. Very distracting. With effort, it can be ignored when deeply involved in activities.   Moderately severe pain 4 Impossible to ignore for more than a few minutes. With effort, patients may still be able to manage work or participate in some social activities. Very difficult to concentrate. Signs of autonomic nervous system discharge are evident: dilated pupils (mydriasis); mild sweating (diaphoresis); sleep interference. Heart rate becomes elevated (>115 bpm). Diastolic blood pressure (lower number) rises above 100 mmHg. Patients find relief in laying down and not moving.   Severe pain 5 Intense and extremely unpleasant. Associated with frowning face and frequent crying. Pain overwhelms the senses.  Ability to do any activity or maintain social relationships becomes significantly limited. Conversation becomes difficult. Pacing back and forth is common, as getting into a comfortable position is nearly impossible. Pain wakes you up from deep sleep. Physical signs will be obvious: pupillary dilation; increased sweating; goosebumps; brisk reflexes; cold, clammy hands and feet; nausea, vomiting or dry heaves; loss of appetite; significant sleep disturbance with inability to fall asleep or to remain asleep. When persistent, significant weight loss is observed due to the complete loss of appetite and sleep deprivation.  Blood pressure and heart rate becomes significantly elevated. Caution: If elevated blood pressure triggers a pounding headache associated with blurred vision, then the patient should immediately seek attention at an urgent or emergency care unit, as these may be signs of an impending stroke.    Emergency Department Pain Levels (6-10/10)  Emergency Room Pain 6 Severely limiting. Requires emergency care  and should not be seen or managed at an outpatient pain management facility. Communication becomes difficult and requires great effort. Assistance to reach the emergency department may be required. Facial flushing and profuse sweating along with potentially dangerous increases in heart rate and blood pressure will be evident.   Distressing pain 7 Self-care is very difficult. Assistance is required to transport, or use restroom. Assistance to reach the emergency department will be required. Tasks requiring coordination, such as bathing and getting dressed become very difficult.   Disabling pain 8 Self-care is no longer possible. At this level, pain is disabling. The individual is unable to do even the most "basic" activities such as walking, eating, bathing, dressing, transferring to a bed, or toileting. Fine motor skills are lost. It is difficult to think clearly.   Incapacitating pain 9 Pain becomes incapacitating. Thought processing is no longer possible. Difficult to remember your own name. Control of movement and coordination are lost.   The worst pain imaginable 10 At this level, most patients pass out from pain. When this level is reached, collapse of the autonomic nervous system occurs, leading to a sudden drop in blood pressure and heart rate. This in turn results in a temporary and dramatic drop in blood flow to the brain, leading to a loss of consciousness. Fainting is one of the body's self defense mechanisms. Passing out puts the brain in a calmed state and causes it to shut down for a while, in order to begin the healing process.    Summary: 1. Refer to this scale when providing us with your pain level. 2. Be accurate and careful when reporting your pain level. This will help with your care. 3. Over-reporting your pain level will lead to loss of credibility. 4. Even   a level of 1/10 means that there is pain and will be treated at our facility. 5. High, inaccurate reporting will be  documented as "Symptom Exaggeration", leading to loss of credibility and suspicions of possible secondary gains such as obtaining more narcotics, or wanting to appear disabled, for fraudulent reasons. 6. Only pain levels of 5 or below will be seen at our facility. 7. Pain levels of 6 and above will be sent to the Emergency Department and the appointment cancelled. ____________________________________________________________________________________________   BMI Assessment: Estimated body mass index is 40.14 kg/m as calculated from the following:   Height as of this encounter: 5\' 8"  (1.727 m).   Weight as of this encounter: 264 lb (119.7 kg).  BMI interpretation table: BMI level Category Range association with higher incidence of chronic pain  <18 kg/m2 Underweight   18.5-24.9 kg/m2 Ideal body weight   25-29.9 kg/m2 Overweight Increased incidence by 20%  30-34.9 kg/m2 Obese (Class I) Increased incidence by 68%  35-39.9 kg/m2 Severe obesity (Class II) Increased incidence by 136%  >40 kg/m2 Extreme obesity (Class III) Increased incidence by 254%   BMI Readings from Last 4 Encounters:  01/15/18 40.14 kg/m  01/07/18 40.16 kg/m  11/27/17 34.97 kg/m  11/13/17 39.53 kg/m   Wt Readings from Last 4 Encounters:  01/15/18 264 lb (119.7 kg)  01/07/18 264 lb 1.6 oz (119.8 kg)  11/27/17 230 lb (104.3 kg)  11/13/17 260 lb (117.9 kg)

## 2018-01-15 NOTE — Progress Notes (Signed)
Nursing Pain Medication Assessment:  Safety precautions to be maintained throughout the outpatient stay will include: orient to surroundings, keep bed in low position, maintain call bell within reach at all times, provide assistance with transfer out of bed and ambulation.  Medication Inspection Compliance: Pill count conducted under aseptic conditions, in front of the patient. Neither the pills nor the bottle was removed from the patient's sight at any time. Once count was completed pills were immediately returned to the patient in their original bottle.  Medication: Oxycodone/APAP Pill/Patch Count: 32 of 180 pills remain Pill/Patch Appearance: Markings consistent with prescribed medication Bottle Appearance: Standard pharmacy container. Clearly labeled. Filled Date: 03 / 22 / 2019 Last Medication intake:  Today

## 2018-01-20 LAB — TOXASSURE SELECT 13 (MW), URINE

## 2018-03-13 ENCOUNTER — Encounter: Payer: Medicare PPO | Admitting: Nurse Practitioner

## 2018-03-26 ENCOUNTER — Encounter: Payer: Self-pay | Admitting: Pain Medicine

## 2018-03-26 ENCOUNTER — Other Ambulatory Visit: Payer: Self-pay

## 2018-03-26 ENCOUNTER — Ambulatory Visit: Payer: Medicare PPO | Attending: Nurse Practitioner | Admitting: Pain Medicine

## 2018-03-26 VITALS — BP 122/89 | HR 86 | Temp 98.4°F | Resp 18 | Ht 68.0 in | Wt 264.0 lb

## 2018-03-26 DIAGNOSIS — R519 Headache, unspecified: Secondary | ICD-10-CM

## 2018-03-26 DIAGNOSIS — M542 Cervicalgia: Secondary | ICD-10-CM

## 2018-03-26 DIAGNOSIS — M549 Dorsalgia, unspecified: Secondary | ICD-10-CM | POA: Diagnosis not present

## 2018-03-26 DIAGNOSIS — F329 Major depressive disorder, single episode, unspecified: Secondary | ICD-10-CM | POA: Insufficient documentation

## 2018-03-26 DIAGNOSIS — R200 Anesthesia of skin: Secondary | ICD-10-CM | POA: Insufficient documentation

## 2018-03-26 DIAGNOSIS — M5410 Radiculopathy, site unspecified: Secondary | ICD-10-CM | POA: Diagnosis not present

## 2018-03-26 DIAGNOSIS — R7303 Prediabetes: Secondary | ICD-10-CM | POA: Insufficient documentation

## 2018-03-26 DIAGNOSIS — G4486 Cervicogenic headache: Secondary | ICD-10-CM

## 2018-03-26 DIAGNOSIS — M47817 Spondylosis without myelopathy or radiculopathy, lumbosacral region: Secondary | ICD-10-CM | POA: Diagnosis not present

## 2018-03-26 DIAGNOSIS — M5136 Other intervertebral disc degeneration, lumbar region: Secondary | ICD-10-CM | POA: Diagnosis not present

## 2018-03-26 DIAGNOSIS — M419 Scoliosis, unspecified: Secondary | ICD-10-CM | POA: Diagnosis not present

## 2018-03-26 DIAGNOSIS — G894 Chronic pain syndrome: Secondary | ICD-10-CM

## 2018-03-26 DIAGNOSIS — M4726 Other spondylosis with radiculopathy, lumbar region: Secondary | ICD-10-CM | POA: Diagnosis not present

## 2018-03-26 DIAGNOSIS — M5030 Other cervical disc degeneration, unspecified cervical region: Secondary | ICD-10-CM

## 2018-03-26 DIAGNOSIS — M25512 Pain in left shoulder: Secondary | ICD-10-CM

## 2018-03-26 DIAGNOSIS — M4696 Unspecified inflammatory spondylopathy, lumbar region: Secondary | ICD-10-CM | POA: Insufficient documentation

## 2018-03-26 DIAGNOSIS — F411 Generalized anxiety disorder: Secondary | ICD-10-CM | POA: Insufficient documentation

## 2018-03-26 DIAGNOSIS — M51369 Other intervertebral disc degeneration, lumbar region without mention of lumbar back pain or lower extremity pain: Secondary | ICD-10-CM

## 2018-03-26 DIAGNOSIS — M79661 Pain in right lower leg: Secondary | ICD-10-CM | POA: Insufficient documentation

## 2018-03-26 DIAGNOSIS — Z6841 Body Mass Index (BMI) 40.0 and over, adult: Secondary | ICD-10-CM | POA: Diagnosis not present

## 2018-03-26 DIAGNOSIS — M5441 Lumbago with sciatica, right side: Secondary | ICD-10-CM | POA: Diagnosis not present

## 2018-03-26 DIAGNOSIS — Z87891 Personal history of nicotine dependence: Secondary | ICD-10-CM | POA: Insufficient documentation

## 2018-03-26 DIAGNOSIS — M4802 Spinal stenosis, cervical region: Secondary | ICD-10-CM | POA: Diagnosis not present

## 2018-03-26 DIAGNOSIS — E785 Hyperlipidemia, unspecified: Secondary | ICD-10-CM | POA: Diagnosis not present

## 2018-03-26 DIAGNOSIS — G8929 Other chronic pain: Secondary | ICD-10-CM

## 2018-03-26 DIAGNOSIS — R51 Headache: Secondary | ICD-10-CM

## 2018-03-26 DIAGNOSIS — R202 Paresthesia of skin: Secondary | ICD-10-CM

## 2018-03-26 DIAGNOSIS — M5134 Other intervertebral disc degeneration, thoracic region: Secondary | ICD-10-CM | POA: Diagnosis not present

## 2018-03-26 DIAGNOSIS — Z9889 Other specified postprocedural states: Secondary | ICD-10-CM | POA: Insufficient documentation

## 2018-03-26 DIAGNOSIS — M5116 Intervertebral disc disorders with radiculopathy, lumbar region: Secondary | ICD-10-CM | POA: Insufficient documentation

## 2018-03-26 DIAGNOSIS — M79605 Pain in left leg: Secondary | ICD-10-CM

## 2018-03-26 DIAGNOSIS — M5010 Cervical disc disorder with radiculopathy, unspecified cervical region: Secondary | ICD-10-CM | POA: Diagnosis not present

## 2018-03-26 DIAGNOSIS — M79662 Pain in left lower leg: Secondary | ICD-10-CM | POA: Insufficient documentation

## 2018-03-26 DIAGNOSIS — M4180 Other forms of scoliosis, site unspecified: Secondary | ICD-10-CM | POA: Insufficient documentation

## 2018-03-26 DIAGNOSIS — Z79891 Long term (current) use of opiate analgesic: Secondary | ICD-10-CM | POA: Diagnosis not present

## 2018-03-26 DIAGNOSIS — M79604 Pain in right leg: Secondary | ICD-10-CM | POA: Diagnosis not present

## 2018-03-26 DIAGNOSIS — Z79899 Other long term (current) drug therapy: Secondary | ICD-10-CM | POA: Diagnosis not present

## 2018-03-26 DIAGNOSIS — M9981 Other biomechanical lesions of cervical region: Secondary | ICD-10-CM

## 2018-03-26 DIAGNOSIS — M47816 Spondylosis without myelopathy or radiculopathy, lumbar region: Secondary | ICD-10-CM | POA: Insufficient documentation

## 2018-03-26 DIAGNOSIS — M5481 Occipital neuralgia: Secondary | ICD-10-CM | POA: Diagnosis not present

## 2018-03-26 DIAGNOSIS — M5412 Radiculopathy, cervical region: Secondary | ICD-10-CM

## 2018-03-26 MED ORDER — OXYCODONE-ACETAMINOPHEN 5-325 MG PO TABS: 1.0000 | ORAL_TABLET | ORAL | 0 refills | Status: DC | PRN

## 2018-03-26 NOTE — Progress Notes (Signed)
Nursing Pain Medication Assessment:  Safety precautions to be maintained throughout the outpatient stay will include: orient to surroundings, keep bed in low position, maintain call bell within reach at all times, provide assistance with transfer out of bed and ambulation.  Medication Inspection Compliance: Pill count conducted under aseptic conditions, in front of the patient. Neither the pills nor the bottle was removed from the patient's sight at any time. Once count was completed pills were immediately returned to the patient in their original bottle.  Medication: Oxycodone/APAP Pill/Patch Count: 2 of 180 pills remain Pill/Patch Appearance: Markings consistent with prescribed medication Bottle Appearance: Standard pharmacy container. Clearly labeled. Filled Date:05/21/ 2019 Last Medication intake:  Today

## 2018-03-26 NOTE — Progress Notes (Signed)
Patient's Name: John Garrett  MRN: [id number]  Referring Provider: Steele Sizer, MD  DOB: 1961/03/23  PCP: Steele Sizer, MD  DOS: 03/26/2018  Note by: Gaspar Cola, MD  Service setting: Ambulatory outpatient  Specialty: Interventional Pain Management  Location: [facility] (AMB) Pain Management Facility    Patient type: Established   Primary Reason(s) for Visit: Encounter for prescription drug management. (Level of risk: moderate)  CC: Neck Pain and Back Pain (lower)  HPI  Mr. Guo is a 57 y.o. year old, male patient, who comes today for a medication management evaluation. He has Chronic lumbar radiculopathy (S1) (Right); Continuous opioid dependence ([place]); GAD (generalized anxiety disorder); Major depression, chronic; Morbid obesity ([place]); Dyslipidemia; Polypharmacy; Nocturnal leg cramps; Prediabetes; Chronic pain syndrome; Lumbar spondylosis; Chronic low back pain (Primary Area of Pain) (Bilateral) (R>L) w/ sciatica (Right); Lumbar facet joint syndrome (Bilateral) (R>L); Spondylosis without myelopathy or radiculopathy, lumbosacral region; Chronic lower extremity pain (Secondary Area of Pain) (Bilateral) (R>L); Chronic radicular pain of lower extremity (S1 dermatome) (Right); Chronic neck pain (Tertiary Area of Pain) (Bilateral) (R>L); Cervicogenic headache (Right); Unilateral occipital headache (Right); Cervico-occipital neuralgia (Fourth Area of Pain) (Right); Chronic shoulder pain (Fifth Area of Pain) (Left); Numbness and tingling of arm (Right); Cervical radiculitis (C7/C8) (Right); Osteoarthritis of spine with radiculopathy, lumbar region (S1) (Right); DDD (degenerative disc disease), lumbar; Lumbar facet arthropathy (Bilateral); DDD (degenerative disc disease), thoracic; Thoracic Levoscoliosis; Cervical foraminal stenosis (C3-4, C5-6) (Left); DDD (degenerative disc disease), cervical; and Cervicalgia on their problem list. His primarily concern today is the Neck Pain and Back Pain  (lower)  Pain Assessment: Location: Lower Back Radiating: both legs to bottoms of the feet, worse in leg, with numbness in legs Onset: More than a month ago Duration: Chronic pain Quality: Aching Severity: 8 /10 (subjective, self-reported pain score)  Note: Reported level is inconsistent with clinical observations. Clinically the patient looks like a 2/10 A 2/10 is viewed as "Mild to Moderate" and described as noticeable and distracting. Impossible to hide from other people. More frequent flare-ups. Still possible to adapt and function close to normal. It can be very annoying and may have occasional stronger flare-ups. With discipline, patients may get used to it and adapt. Information on the proper use of the pain scale provided to the patient today. When using our objective Pain Scale, levels between 6 and 10/10 are said to belong in an emergency room, as it progressively worsens from a 6/10, described as severely limiting, requiring emergency care not usually available at an outpatient pain management facility. At a 6/10 level, communication becomes difficult and requires great effort. Assistance to reach the emergency department may be required. Facial flushing and profuse sweating along with potentially dangerous increases in heart rate and blood pressure will be evident. Timing: Constant Modifying factors: medications BP: 122/89  HR: 86  Mr. Sikorski is a patient of Dr Holley Raring. The patient apparently was unable to keep his last medication management appointment. Dr. Holley Raring as well as our nurse practitioner our on vacation today and therefore he has been put on my schedule since he is running out of medication. Today I will review his case and provide him with a 30 day prescription. I have scheduled him to return in one month to be seen by Dr. Holley Raring for further follow-up. During today's appointment we reviewed Mr. Monie chronic pain status, as well as his outpatient medication regimen. See my  recommendations below.  The patient  reports that he does not use drugs. His body  mass index is 40.14 kg/m.  Further details on both, my assessment(s), as well as the proposed treatment plan, please see below.  Controlled Substance Pharmacotherapy Assessment REMS (Risk Evaluation and Mitigation Strategy)  Analgesic: Oxycodone/APAP 5/325 one tablet every 4 hours when necessary for pain (30 mg/day of oxycodone) MME/day: 45 mg/day.  Landis Martins, RN  03/26/2018 11:24 AM  Sign at close encounter Nursing Pain Medication Assessment:  Safety precautions to be maintained throughout the outpatient stay will include: orient to surroundings, keep bed in low position, maintain call bell within reach at all times, provide assistance with transfer out of bed and ambulation.  Medication Inspection Compliance: Pill count conducted under aseptic conditions, in front of the patient. Neither the pills nor the bottle was removed from the patient's sight at any time. Once count was completed pills were immediately returned to the patient in their original bottle.  Medication: Oxycodone/APAP Pill/Patch Count: 2 of 180 pills remain Pill/Patch Appearance: Markings consistent with prescribed medication Bottle Appearance: Standard pharmacy container. Clearly labeled. Filled Date:05/21/ 2019 Last Medication intake:  Today   Pharmacokinetics: Liberation and absorption (onset of action): WNL Distribution (time to peak effect): WNL Metabolism and excretion (duration of action): WNL         Pharmacodynamics: Desired effects: Analgesia: Mr. Maddocks reports >50% benefit. Functional ability: Patient reports that medication allows him to accomplish basic ADLs Clinically meaningful improvement in function (CMIF): Sustained CMIF goals met Perceived effectiveness: Described as relatively effective, allowing for increase in activities of daily living (ADL) Undesirable effects: Side-effects or Adverse reactions: None  reported Monitoring: [place] PMP: Online review of the past 4-monthperiod conducted. Compliant with practice rules and regulations Last UDS on record: Summary  Date Value Ref Range Status  01/15/2018 FINAL  Final    Comment:    ==================================================================== TOXASSURE SELECT 13 (MW) ==================================================================== Test                             Result       Flag       Units Drug Present and Declared for Prescription Verification   Oxycodone                      163          EXPECTED   ng/mg creat   Noroxycodone                   680          EXPECTED   ng/mg creat    Sources of oxycodone include scheduled prescription medications.    Noroxycodone is an expected metabolite of oxycodone. ==================================================================== Test                      Result    Flag   Units      Ref Range   Creatinine              174              mg/dL      >=20 ==================================================================== Declared Medications:  The flagging and interpretation on this report are based on the  following declared medications.  Unexpected results may arise from  inaccuracies in the declared medications.  **Note: The testing scope of this panel includes these medications:  Oxycodone (Oxycodone Acetaminophen)  **Note: The testing scope of this panel does not include following  reported medications:  Acetaminophen (Oxycodone Acetaminophen)  Bupropion  Buspirone  Citalopram  Ibuprofen  Rosuvastatin  Tizanidine ==================================================================== For clinical consultation, please call [phone]. ====================================================================    UDS interpretation: Compliant          Medication Assessment Form: Reviewed. Patient indicates being compliant with therapy Treatment compliance: Compliant Risk Assessment  Profile: Aberrant behavior: See prior evaluations. None observed or detected today Comorbid factors increasing risk of overdose: See prior notes. No additional risks detected today Risk of substance use disorder (SUD): Low Opioid Risk Tool - 03/26/18 1154      Family History of Substance Abuse   Alcohol  Negative    Illegal Drugs  Negative    Rx Drugs  Negative      Personal History of Substance Abuse   Alcohol  Negative    Illegal Drugs  Negative    Rx Drugs  Negative      Age   Age between 67-45 years   No      History of Preadolescent Sexual Abuse   History of Preadolescent Sexual Abuse  Negative or Male      Psychological Disease   Psychological Disease  Negative    Depression  Negative      Total Score   Opioid Risk Tool Scoring  0    Opioid Risk Interpretation  Low Risk      ORT Scoring interpretation table:  Score <3 = Low Risk for SUD  Score between 4-7 = Moderate Risk for SUD  Score >8 = High Risk for Opioid Abuse   Risk Mitigation Strategies:  Patient Counseling: Covered Patient-Prescriber Agreement (PPA): Present and active  Notification to other healthcare providers: Done  Pharmacologic Plan: No change in therapy, at this time.             Laboratory Chemistry  Renal Function Markers Lab Results  Component Value Date   BUN 17 01/07/2018   CREATININE 1.04 37/48/2707   BCR NOT APPLICABLE 86/75/4492   GFRAA 93 01/07/2018   GFRNONAA 80 01/07/2018                             Hepatic Function Markers Lab Results  Component Value Date   AST 23 01/07/2018   ALT 33 01/07/2018                        Electrolytes Lab Results  Component Value Date   NA 136 01/07/2018   K 4.9 01/07/2018   CL 103 01/07/2018   CALCIUM 9.7 01/07/2018                        Neuropathy Markers Lab Results  Component Value Date   HGBA1C 5.8 (H) 01/07/2018   HIV NON-REACTIVE 01/07/2018                        Coagulation Parameters Lab Results  Component Value  Date   PLT 284 01/07/2018                        Cardiovascular Markers Lab Results  Component Value Date   HGB 16.3 01/07/2018   HCT 47.1 01/07/2018                         Note: Lab results reviewed.  Recent Diagnostic Imaging Results  DG C-Arm  1-60 Min-No Report Fluoroscopy was utilized by the requesting physician.  No radiographic  interpretation.   Complexity Note: Imaging results reviewed. Results shared with Mr. Rosendahl, using Layman's terms.                         Meds   Current Outpatient Medications:  .  buPROPion (WELLBUTRIN XL) 300 MG 24 hr tablet, Take 1 tablet (300 mg total) by mouth daily., Disp: 90 tablet, Rfl: 1 .  busPIRone (BUSPAR) 15 MG tablet, Take 1 tablet (15 mg total) by mouth 2 (two) times daily., Disp: 180 tablet, Rfl: 1 .  citalopram (CELEXA) 40 MG tablet, Take 1 tablet (40 mg total) by mouth daily., Disp: 90 tablet, Rfl: 1 .  ibuprofen (ADVIL,MOTRIN) 800 MG tablet, Take 800 mg by mouth every 8 (eight) hours as needed., Disp: , Rfl:  .  oxyCODONE-acetaminophen (PERCOCET) 5-325 MG tablet, Take 1 tablet by mouth every 4 (four) hours as needed for severe pain., Disp: 180 tablet, Rfl: 0 .  rosuvastatin (CRESTOR) 40 MG tablet, Take 1 tablet (40 mg total) by mouth daily., Disp: 90 tablet, Rfl: 0 .  tiZANidine (ZANAFLEX) 2 MG tablet, Take by mouth daily., Disp: , Rfl:   ROS  Constitutional: Denies any fever or chills Gastrointestinal: No reported hemesis, hematochezia, vomiting, or acute GI distress Musculoskeletal: Denies any acute onset joint swelling, redness, loss of ROM, or weakness Neurological: No reported episodes of acute onset apraxia, aphasia, dysarthria, agnosia, amnesia, paralysis, loss of coordination, or loss of consciousness  Allergies  Mr. Noecker has No Known Allergies.  [place]  Drug: Mr. Lysaght  reports that he does not use drugs. Alcohol:  reports that he drinks about 7.2 oz of alcohol per week. Tobacco:  reports that he has been smoking  e-cigarettes.  He has a 20.00 pack-year smoking history. He quit smokeless tobacco use about 17 years ago. His smokeless tobacco use included chew. Medical:  has a past medical history of Anxiety, Chronic back pain, Chronic pain, Depression, Hyperlipidemia, and Prediabetes (01/08/2018). Surgical: Mr. Doublin  has a past surgical history that includes Cataract extraction (Right, 2014). Family: Family history is unknown by patient.  Constitutional Exam  General appearance: Well nourished, well developed, and well hydrated. In no apparent acute distress Vitals:   03/26/18 1118  BP: 122/89  Pulse: 86  Resp: 18  Temp: 98.4 F (36.9 C)  TempSrc: Oral  SpO2: 98%  Weight: 264 lb (119.7 kg)  Height: 5' 8" (1.727 m)   BMI Assessment: Estimated body mass index is 40.14 kg/m as calculated from the following:   Height as of this encounter: 5' 8" (1.727 m).   Weight as of this encounter: 264 lb (119.7 kg).  BMI interpretation table: BMI level Category Range association with higher incidence of chronic pain  <18 kg/m2 Underweight   18.5-24.9 kg/m2 Ideal body weight   25-29.9 kg/m2 Overweight Increased incidence by 20%  30-34.9 kg/m2 Obese (Class I) Increased incidence by 68%  35-39.9 kg/m2 Severe obesity (Class II) Increased incidence by 136%  >40 kg/m2 Extreme obesity (Class III) Increased incidence by 254%   Patient's current BMI Ideal Body weight  Body mass index is 40.14 kg/m. Ideal body weight: 68.4 kg (150 lb 12.7 oz) Adjusted ideal body weight: 88.9 kg (196 lb 1.2 oz)   BMI Readings from Last 4 Encounters:  03/26/18 40.14 kg/m  01/15/18 40.14 kg/m  01/07/18 40.16 kg/m  11/27/17 34.97 kg/m   Wt Readings from  Last 4 Encounters:  03/26/18 264 lb (119.7 kg)  01/15/18 264 lb (119.7 kg)  01/07/18 264 lb 1.6 oz (119.8 kg)  11/27/17 230 lb (104.3 kg)  Psych/Mental status: Alert, oriented x 3 (person, place, & time)       Eyes: PERLA Respiratory: No evidence of acute respiratory  distress  Cervical Spine Area Exam  Skin & Axial Inspection: No masses, redness, edema, swelling, or associated skin lesions Alignment: Symmetrical Functional ROM: Decreased ROM      Stability: No instability detected Muscle Tone/Strength: Functionally intact. No obvious neuro-muscular anomalies detected. Sensory (Neurological): Movement-associated discomfort Palpation: No palpable anomalies              Upper Extremity (UE) Exam    Side: Right upper extremity  Side: Left upper extremity  Skin & Extremity Inspection: Skin color, temperature, and hair growth are WNL. No peripheral edema or cyanosis. No masses, redness, swelling, asymmetry, or associated skin lesions. No contractures.  Skin & Extremity Inspection: Skin color, temperature, and hair growth are WNL. No peripheral edema or cyanosis. No masses, redness, swelling, asymmetry, or associated skin lesions. No contractures.  Functional ROM: Unrestricted ROM          Functional ROM: Unrestricted ROM          Muscle Tone/Strength: Functionally intact. No obvious neuro-muscular anomalies detected.  Muscle Tone/Strength: Functionally intact. No obvious neuro-muscular anomalies detected.  Sensory (Neurological): Unimpaired          Sensory (Neurological): Unimpaired          Palpation: No palpable anomalies              Palpation: No palpable anomalies              Provocative Test(s):  Phalen's test: deferred Tinel's test: deferred Apley's scratch test (touch opposite shoulder):  Action 1 (Across chest): deferred Action 2 (Overhead): deferred Action 3 (LB reach): deferred   Provocative Test(s):  Phalen's test: deferred Tinel's test: deferred Apley's scratch test (touch opposite shoulder):  Action 1 (Across chest): deferred Action 2 (Overhead): deferred Action 3 (LB reach): deferred    Thoracic Spine Area Exam  Skin & Axial Inspection: No masses, redness, or swelling Alignment: Symmetrical Functional ROM: Unrestricted  ROM Stability: No instability detected Muscle Tone/Strength: Functionally intact. No obvious neuro-muscular anomalies detected. Sensory (Neurological): Unimpaired Muscle strength & Tone: No palpable anomalies  Lumbar Spine Area Exam  Skin & Axial Inspection: No masses, redness, or swelling Alignment: Symmetrical Functional ROM: Minimal ROM       Stability: No instability detected Muscle Tone/Strength: Increased muscle tone over affected area Sensory (Neurological): Movement-associated pain Palpation: Complains of area being tender to palpation       Provocative Tests: Lumbar Hyperextension/rotation test: (+) bilaterally for facet joint pain. Lumbar quadrant test (Kemp's test): (+) bilaterally for facet joint pain. Lumbar Lateral bending test: deferred today       Patrick's Maneuver: (+) for bilateral S-I arthralgia             FABER test: (+) for bilateral S-I arthralgia Thigh-thrust test: deferred today       S-I compression test: deferred today       S-I distraction test: deferred today        Gait & Posture Assessment  Ambulation: Independent. Level and Non-level surfaces Gait: Modified gait pattern (slower gait speed, wider stride width, and longer stance duration) associated with morbid obesity Posture: Difficulty standing up straight, due to pain   Lower Extremity Exam  Side: Right lower extremity  Side: Left lower extremity  Stability: No instability observed          Stability: No instability observed          Skin & Extremity Inspection: Skin color, temperature, and hair growth are WNL. No peripheral edema or cyanosis. No masses, redness, swelling, asymmetry, or associated skin lesions. No contractures.  Skin & Extremity Inspection: Skin color, temperature, and hair growth are WNL. No peripheral edema or cyanosis. No masses, redness, swelling, asymmetry, or associated skin lesions. No contractures.  Functional ROM: Diminished ROM for hip and knee joints Limited SLR  (straight leg raise)  Functional ROM: Diminished ROM for hip and knee joints Limited SLR (straight leg raise)  Muscle Tone/Strength: Able to Toe-walk & Heel-walk without problems  Muscle Tone/Strength: Able to Toe-walk & Heel-walk without problems  Sensory (Neurological): Dermatomal pain pattern  Sensory (Neurological): Referred pain pattern  Palpation: No palpable anomalies  Palpation: No palpable anomalies   Assessment  Primary Diagnosis & Pertinent Problem List: The primary encounter diagnosis was Chronic low back pain (Primary Area of Pain) (Bilateral) (R>L) w/ sciatica (Right). Diagnoses of DDD (degenerative disc disease), lumbar, Lumbar facet arthropathy (Bilateral), Lumbar facet joint syndrome (Bilateral) (R>L), Spondylosis without myelopathy or radiculopathy, lumbosacral region, Chronic lower extremity pain (Secondary Area of Pain) (Bilateral) (R>L), Osteoarthritis of spine with radiculopathy, lumbar region (S1) (Right), Chronic radicular pain of lower extremity (S1 dermatome) (Right), Chronic neck pain (Tertiary Area of Pain) (Bilateral) (R>L), Cervicalgia, DDD (degenerative disc disease), cervical, Cervico-occipital neuralgia (Fourth Area of Pain) (Right), Cervical foraminal stenosis (C3-4, C5-6) (Left), Cervical radiculitis (C7/C8) (Right), Chronic shoulder pain (Fifth Area of Pain) (Left), Cervicogenic headache (Right), Numbness and tingling of arm (Right), Unilateral occipital headache (Right), DDD (degenerative disc disease), thoracic, Thoracic Levoscoliosis, and Chronic pain syndrome were also pertinent to this visit.  Status Diagnosis  Persistent Unimproved Stable 1. Chronic low back pain (Primary Area of Pain) (Bilateral) (R>L) w/ sciatica (Right)   2. DDD (degenerative disc disease), lumbar   3. Lumbar facet arthropathy (Bilateral)   4. Lumbar facet joint syndrome (Bilateral) (R>L)   5. Spondylosis without myelopathy or radiculopathy, lumbosacral region   6. Chronic lower  extremity pain (Secondary Area of Pain) (Bilateral) (R>L)   7. Osteoarthritis of spine with radiculopathy, lumbar region (S1) (Right)   8. Chronic radicular pain of lower extremity (S1 dermatome) (Right)   9. Chronic neck pain (Tertiary Area of Pain) (Bilateral) (R>L)   10. Cervicalgia   11. DDD (degenerative disc disease), cervical   12. Cervico-occipital neuralgia (Fourth Area of Pain) (Right)   13. Cervical foraminal stenosis (C3-4, C5-6) (Left)   14. Cervical radiculitis (C7/C8) (Right)   15. Chronic shoulder pain (Fifth Area of Pain) (Left)   16. Cervicogenic headache (Right)   17. Numbness and tingling of arm (Right)   18. Unilateral occipital headache (Right)   19. DDD (degenerative disc disease), thoracic   20. Thoracic Levoscoliosis   21. Chronic pain syndrome     Problems updated and reviewed during this visit: Problem  Chronic low back pain (Primary Area of Pain) (Bilateral) (R>L) w/ sciatica (Right)  Lumbar facet joint syndrome (Bilateral) (R>L)  Spondylosis Without Myelopathy Or Radiculopathy, Lumbosacral Region  Chronic lower extremity pain (Secondary Area of Pain) (Bilateral) (R>L)  Chronic radicular pain of lower extremity (S1 dermatome) (Right)   Pain pattern: Pain travels down to the bottom of the foot following an S1 dermatomal distribution, through the posterior aspect of the leg.  Chronic neck pain (Tertiary Area of Pain) (Bilateral) (R>L)  Cervicogenic headache (Right)  Unilateral occipital headache (Right)  Cervico-occipital neuralgia (Fourth Area of Pain) (Right)   Lesser occipital nerve neuralgia   Chronic shoulder pain (Fifth Area of Pain) (Left)  Numbness and tingling of arm (Right)   This numbness seems to follow a C7/C8 dermatomal distribution   Cervical radiculitis (C7/C8) (Right)  Osteoarthritis of spine with radiculopathy, lumbar region (S1) (Right)  Ddd (Degenerative Disc Disease), Lumbar  Lumbar facet arthropathy (Bilateral)  Ddd  (Degenerative Disc Disease), Thoracic  Thoracic Levoscoliosis  Cervical foraminal stenosis (C3-4, C5-6) (Left)   Moderate neural foraminal narrowing on the left at C3-C4 and C5-C6.   Ddd (Degenerative Disc Disease), Cervical  Cervicalgia  Chronic Pain Syndrome  Lumbar Spondylosis  Chronic lumbar radiculopathy (S1) (Right)  Nocturnal Leg Cramps  Polypharmacy  Morbid Obesity ([facility])  Continuous opioid dependence ([facility])  Prediabetes  Dyslipidemia  Gad (Generalized Anxiety Disorder)  Major Depression, Chronic  Compulsive Tobacco User Syndrome (Resolved)  Hyperlipidemia (Resolved)   Plan of Care  Pharmacotherapy (Medications Ordered): Meds ordered this encounter  Medications  . oxyCODONE-acetaminophen (PERCOCET) 5-325 MG tablet    Sig: Take 1 tablet by mouth every 4 (four) hours as needed for severe pain.    Dispense:  180 tablet    Refill:  0    Do not place this medication, or any other prescription from our practice, on "Automatic Refill". Prescription written on behalf of Dr. [name]. Do not fill until: 03/26/18 To last until: 04/25/18   Medications administered today: Buren Kos had no medications administered during this visit.  Procedure Orders    No procedure(s) ordered today   Lab Orders  No laboratory test(s) ordered today   Imaging Orders  No imaging studies ordered today   Referral Orders  No referral(s) requested today   Interventional management options: Planned, scheduled, and/or pending:   Not at this time. We will wait until Dr. Holley Raring returns for final determination on a long-term plan. He desires to hold off on any additional steroid injections secondary to weight gain. He will start aqua therapy.   Considering:   Diagnostic bilateral lumbar facet block  Possible bilateral lumbar facet RFA  Diagnostic bilateral sacroiliac joint block  Possible bilateral sacroiliac joint RFA  Diagnostic right-sided L5-S1 interlaminar LESI  Diagnostic  right-sided S1 selective nerve root block/transforaminal epidural steroid injection  Diagnostic bilateral cervical facet block  Possible bilateral cervical facet RFA  Diagnostic right-sided cervical epidural steroid injection  Diagnostic left sided intra-articular shoulder joint injection  Diagnostic left sided suprascapular nerve block  Possible left sided suprascapular nerve RFA    Palliative PRN treatment(s):   None at this time   Recommendation(s):   The patient's condition is triggered and worsened by his nicotine addiction and morbid obesity. Because of this reason, the patient needs to take responsibility and quit smoking as well as work on bringing his BMI to less than 30.  His nicotine use and weight should be closely monitored and the patient informed that should his habits not change, a downwards adjustment of his opioids and perhaps a complete cessation should be undertaken. Opioids to slow down the metabolism making it harder for the patient to lose weight.   If the patient is truly concerned about weight gain, he should avoid the use of steroids as well as the use of opioids.   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 I would recommend a diagnostic bilateral lumbar facet block and bilateral sacroiliac joint block under fluoroscopic guidance and IV sedation, without steroids. Should this be effective in controlling his pain, at least for the duration of local anesthetic, this would confirm the etiology of the pain. Once identified and confirmed, then steps can be taken to have the patient undergo physical therapy (preferably in a swimming pool), followed by radiofrequency ablation of the lumbar facet joints and sacroiliac joints, bilaterally.   This patient appears to have been on opioids for a long time and therefore he has developed tolerance, as observed by his regular use of the medication.  Because of this, the long-term plan should include the possibility of a "Drug Holiday", after which the patient may be returned to his opioids, if necessary, but at a much lower dose.    Provider-requested follow-up: Return in about 1 month (around 04/25/2018) for Med-Mgmt, w/ Dr. Holley Raring.  Future Appointments  Date Time Provider [place]  04/01/2018 10:00 AM [place] ADVISOR [facility]  04/11/2018 11:40 AM Steele Sizer, MD [facility]  04/22/2018 10:15 AM [name], MD [facility] None   Primary Care Physician: Steele Sizer, MD Location: [facility] Note by: Gaspar Cola, MD Date: 03/26/2018; Time: 1:25 PM

## 2018-03-26 NOTE — Patient Instructions (Addendum)
____________________________________________________________________________________________  Medication Rules  Applies to: All patients receiving prescriptions (written or electronic).  Pharmacy of record: Pharmacy where electronic prescriptions will be sent. If written prescriptions are taken to a different pharmacy, please inform the nursing staff. The pharmacy listed in the electronic medical record should be the one where you would like electronic prescriptions to be sent.  Prescription refills: Only during scheduled appointments. Applies to both, written and electronic prescriptions.  NOTE: The following applies primarily to controlled substances (Opioid* Pain Medications).   Patient's responsibilities: 1. Pain Pills: Bring all pain pills to every appointment (except for procedure appointments). 2. Pill Bottles: Bring pills in original pharmacy bottle. Always bring newest bottle. Bring bottle, even if empty. 3. Medication refills: You are responsible for knowing and keeping track of what medications you need refilled. The day before your appointment, write a list of all prescriptions that need to be refilled. Bring that list to your appointment and give it to the admitting nurse. Prescriptions will be written only during appointments. If you forget a medication, it will not be "Called in", "Faxed", or "electronically sent". You will need to get another appointment to get these prescribed. 4. Prescription Accuracy: You are responsible for carefully inspecting your prescriptions before leaving our office. Have the discharge nurse carefully go over each prescription with you, before taking them home. Make sure that your name is accurately spelled, that your address is correct. Check the name and dose of your medication to make sure it is accurate. Check the number of pills, and the written instructions to make sure they are clear and accurate. Make sure that you are given enough medication to last  until your next medication refill appointment. 5. Taking Medication: Take medication as prescribed. Never take more pills than instructed. Never take medication more frequently than prescribed. Taking less pills or less frequently is permitted and encouraged, when it comes to controlled substances (written prescriptions).  6. Inform other Doctors: Always inform, all of your healthcare providers, of all the medications you take. 7. Pain Medication from other Providers: You are not allowed to accept any additional pain medication from any other Doctor or Healthcare provider. There are two exceptions to this rule. (see below) In the event that you require additional pain medication, you are responsible for notifying us, as stated below. 8. Medication Agreement: You are responsible for carefully reading and following our Medication Agreement. This must be signed before receiving any prescriptions from our practice. Safely store a copy of your signed Agreement. Violations to the Agreement will result in no further prescriptions. (Additional copies of our Medication Agreement are available upon request.) 9. Laws, Rules, & Regulations: All patients are expected to follow all Federal and State Laws, Statutes, Rules, & Regulations. Ignorance of the Laws does not constitute a valid excuse. The use of any illegal substances is prohibited. 10. Adopted CDC guidelines & recommendations: Target dosing levels will be at or below 60 MME/day. Use of benzodiazepines** is not recommended.  Exceptions: There are only two exceptions to the rule of not receiving pain medications from other Healthcare Providers. 1. Exception #1 (Emergencies): In the event of an emergency (i.e.: accident requiring emergency care), you are allowed to receive additional pain medication. However, you are responsible for: As soon as you are able, call our office [phone], at any time of the day or night, and leave a message stating your name, the  date and nature of the emergency, and the name and dose of the medication   prescribed. In the event that your call is answered by a member of our staff, make sure to document and save the date, time, and the name of the person that took your information.  2. Exception #2 (Planned Surgery): In the event that you are scheduled by another doctor or dentist to have any type of surgery or procedure, you are allowed (for a period no longer than 30 days), to receive additional pain medication, for the acute post-op pain. However, in this case, you are responsible for picking up a copy of our "Post-op Pain Management for Surgeons" handout, and giving it to your surgeon or dentist. This document is available at our office, and does not require an appointment to obtain it. Simply go to our office during business hours (Monday-Thursday from 8:00 AM to 4:00 PM) (Friday 8:00 AM to 12:00 Noon) or if you have a scheduled appointment with us, prior to your surgery, and ask for it by name. In addition, you will need to provide us with your name, name of your surgeon, type of surgery, and date of procedure or surgery.  *Opioid medications include: morphine, codeine, oxycodone, oxymorphone, hydrocodone, hydromorphone, meperidine, tramadol, tapentadol, buprenorphine, fentanyl, methadone. **Benzodiazepine medications include: diazepam (Valium), alprazolam (Xanax), clonazepam (Klonopine), lorazepam (Ativan), clorazepate (Tranxene), chlordiazepoxide (Librium), estazolam (Prosom), oxazepam (Serax), temazepam (Restoril), triazolam (Halcion) (Last updated: 11/28/2017) ____________________________________________________________________________________________   ____________________________________________________________________________________________  Medication Recommendations and Reminders  Applies to: All patients receiving prescriptions (written and/or electronic).  Medication Rules & Regulations: These rules and  regulations exist for your safety and that of others. They are not flexible and neither are we. Dismissing or ignoring them will be considered "non-compliance" with medication therapy, resulting in complete and irreversible termination of such therapy. (See document titled "Medication Rules" for more details.) In all conscience, because of safety reasons, we cannot continue providing a therapy where the patient does not follow instructions.  Pharmacy of record:   Definition: This is the pharmacy where your electronic prescriptions will be sent.   We do not endorse any particular pharmacy.  You are not restricted in your choice of pharmacy.  The pharmacy listed in the electronic medical record should be the one where you want electronic prescriptions to be sent.  If you choose to change pharmacy, simply notify our nursing staff of your choice of new pharmacy.  Recommendations:  Keep all of your pain medications in a safe place, under lock and key, even if you live alone.   After you fill your prescription, take 1 week's worth of pills and put them away in a safe place. You should keep a separate, properly labeled bottle for this purpose. The remainder should be kept in the original bottle. Use this as your primary supply, until it runs out. Once it's gone, then you know that you have 1 week's worth of medicine, and it is time to come in for a prescription refill. If you do this correctly, it is unlikely that you will ever run out of medicine.  To make sure that the above recommendation works, it is very important that you make sure your medication refill appointments are scheduled at least 1 week before you run out of medicine. To do this in an effective manner, make sure that you do not leave the office without scheduling your next medication management appointment. Always ask the nursing staff to show you in your prescription , when your medication will be running out. Then arrange for the  receptionist to get you a return   appointment, at least 7 days before you run out of medicine. Do not wait until you have 1 or 2 pills left, to come in. This is very poor planning and does not take into consideration that we may need to cancel appointments due to bad weather, sickness, or emergencies affecting our staff.  Prescription refills and/or changes in medication(s):   Prescription refills, and/or changes in dose or medication, will be conducted only during scheduled medication management appointments. (Applies to both, written and electronic prescriptions.)  No refills on procedure days. No medication will be changed or started on procedure days. No changes, adjustments, and/or refills will be conducted on a procedure day. Doing so will interfere with the diagnostic portion of the procedure.  No phone refills. No medications will be "called into the pharmacy".  No Fax refills.  No weekend refills.  No Holliday refills.  No after hours refills.  Remember:  Business hours are:  Monday to Thursday 8:00 AM to 4:00 PM Provider's Schedule: Dionisio David, NP - Appointments are:  Medication management: Monday to Thursday 8:00 AM to 4:00 PM Milinda Pointer, MD - Appointments are:  Medication management: Monday and Wednesday 8:00 AM to 4:00 PM Procedure day: Tuesday and Thursday 7:30 AM to 4:00 PM [name], MD - Appointments are:  Medication management: Tuesday and Thursday 8:00 AM to 4:00 PM Procedure day: Monday and Wednesday 7:30 AM to 4:00 PM (Last update: 11/28/2017) ____________________________________________________________________________________________   ____________________________________________________________________________________________  CANNABIDIOL (AKA: CBD Oil or Pills)  Applies to: All patients receiving prescriptions of controlled substances (written and/or electronic).  General Information: Cannabidiol (CBD) was discovered in 42. It is one of  some 113 identified cannabinoids in cannabis (Marijuana) plants, accounting for up to 40% of the plant's extract. As of 2018, preliminary clinical research on cannabidiol included studies of anxiety, cognition, movement disorders, and pain.  Cannabidiol is consummed in multiple ways, including inhalation of cannabis smoke or vapor, as an aerosol spray into the cheek, and by mouth. It may be supplied as CBD oil containing CBD as the active ingredient (no added tetrahydrocannabinol (THC) or terpenes), a full-plant CBD-dominant hemp extract oil, capsules, dried cannabis, or as a liquid solution. CBD is thought not have the same psychoactivity as THC, and may affect the actions of THC. Studies suggest that CBD may interact with different biological targets, including cannabinoid receptors and other neurotransmitter receptors. As of 2018 the mechanism of action for its biological effects has not been determined.  In the [place], cannabidiol has a limited approval by the Food and Drug Administration (FDA) for treatment of only two types of epilepsy disorders. The side effects of long-term use of the drug include somnolence, decreased appetite, diarrhea, fatigue, malaise, weakness, sleeping problems, and others.  CBD remains a Schedule I drug prohibited for any use.  Legality: Some manufacturers ship CBD products nationally, an illegal action which the FDA has not enforced in 2018, with CBD remaining the subject of an FDA investigational new drug evaluation, and is not considered legal as a dietary supplement or food ingredient as of December 2018. Federal illegality has made it difficult historically to conduct research on CBD. CBD is openly sold in head shops and health food stores in some states where such sales have not been explicitly legalized.  Warning: Because it is not FDA approved for general use or treatment of pain, it is not required to undergo the same manufacturing controls as prescription  drugs.  This means that the available cannabidiol (CBD) may  be contaminated with THC.  If this is the case, it will trigger a positive urine drug screen (UDS) test for cannabinoids (Marijuana).  Because a positive UDS for illicit substances is a violation of our medication agreement, your opioid analgesics (pain medicine) may be permanently discontinued. (Last update: 12/19/2017) ____________________________________________________________________________________________   ____________________________________________________________________________________________  Pain Scale  Introduction: The pain score used by this practice is the Verbal Numerical Rating Scale (VNRS-11). This is an 11-point scale. It is for adults and children 10 years or older. There are significant differences in how the pain score is reported, used, and applied. Forget everything you learned in the past and learn this scoring system.  General Information: The scale should reflect your current level of pain. Unless you are specifically asked for the level of your worst pain, or your average pain. If you are asked for one of these two, then it should be understood that it is over the past 24 hours.  Basic Activities of Daily Living (ADL): Personal hygiene, dressing, eating, transferring, and using restroom.  Instructions: Most patients tend to report their level of pain as a combination of two factors, their physical pain and their psychosocial pain. This last one is also known as "suffering" and it is reflection of how physical pain affects you socially and psychologically. From now on, report them separately. From this point on, when asked to report your pain level, report only your physical pain. Use the following table for reference.  Pain Clinic Pain Levels (0-5/10)  Pain Level Score  Description  No Pain 0   Mild pain 1 Nagging, annoying, but does not interfere with basic activities of daily living (ADL). Patients are  able to eat, bathe, get dressed, toileting (being able to get on and off the toilet and perform personal hygiene functions), transfer (move in and out of bed or a chair without assistance), and maintain continence (able to control bladder and bowel functions). Blood pressure and heart rate are unaffected. A normal heart rate for a healthy adult ranges from 60 to 100 bpm (beats per minute).   Mild to moderate pain 2 Noticeable and distracting. Impossible to hide from other people. More frequent flare-ups. Still possible to adapt and function close to normal. It can be very annoying and may have occasional stronger flare-ups. With discipline, patients may get used to it and adapt.   Moderate pain 3 Interferes significantly with activities of daily living (ADL). It becomes difficult to feed, bathe, get dressed, get on and off the toilet or to perform personal hygiene functions. Difficult to get in and out of bed or a chair without assistance. Very distracting. With effort, it can be ignored when deeply involved in activities.   Moderately severe pain 4 Impossible to ignore for more than a few minutes. With effort, patients may still be able to manage work or participate in some social activities. Very difficult to concentrate. Signs of autonomic nervous system discharge are evident: dilated pupils (mydriasis); mild sweating (diaphoresis); sleep interference. Heart rate becomes elevated (>115 bpm). Diastolic blood pressure (lower number) rises above 100 mmHg. Patients find relief in laying down and not moving.   Severe pain 5 Intense and extremely unpleasant. Associated with frowning face and frequent crying. Pain overwhelms the senses.  Ability to do any activity or maintain social relationships becomes significantly limited. Conversation becomes difficult. Pacing back and forth is common, as getting into a comfortable position is nearly impossible. Pain wakes you up from deep sleep. Physical signs will  be  obvious: pupillary dilation; increased sweating; goosebumps; brisk reflexes; cold, clammy hands and feet; nausea, vomiting or dry heaves; loss of appetite; significant sleep disturbance with inability to fall asleep or to remain asleep. When persistent, significant weight loss is observed due to the complete loss of appetite and sleep deprivation.  Blood pressure and heart rate becomes significantly elevated. Caution: If elevated blood pressure triggers a pounding headache associated with blurred vision, then the patient should immediately seek attention at an urgent or emergency care unit, as these may be signs of an impending stroke.    Emergency Department Pain Levels (6-10/10)  Emergency Room Pain 6 Severely limiting. Requires emergency care and should not be seen or managed at an outpatient pain management facility. Communication becomes difficult and requires great effort. Assistance to reach the emergency department may be required. Facial flushing and profuse sweating along with potentially dangerous increases in heart rate and blood pressure will be evident.   Distressing pain 7 Self-care is very difficult. Assistance is required to transport, or use restroom. Assistance to reach the emergency department will be required. Tasks requiring coordination, such as bathing and getting dressed become very difficult.   Disabling pain 8 Self-care is no longer possible. At this level, pain is disabling. The individual is unable to do even the most "basic" activities such as walking, eating, bathing, dressing, transferring to a bed, or toileting. Fine motor skills are lost. It is difficult to think clearly.   Incapacitating pain 9 Pain becomes incapacitating. Thought processing is no longer possible. Difficult to remember your own name. Control of movement and coordination are lost.   The worst pain imaginable 10 At this level, most patients pass out from pain. When this level is reached, collapse of the  autonomic nervous system occurs, leading to a sudden drop in blood pressure and heart rate. This in turn results in a temporary and dramatic drop in blood flow to the brain, leading to a loss of consciousness. Fainting is one of the body's self defense mechanisms. Passing out puts the brain in a calmed state and causes it to shut down for a while, in order to begin the healing process.    Summary: 1. Refer to this scale when providing [place] with your pain level. 2. Be accurate and careful when reporting your pain level. This will help with your care. 3. Over-reporting your pain level will lead to loss of credibility. 4. Even a level of 1/10 means that there is pain and will be treated at our facility. 5. High, inaccurate reporting will be documented as "Symptom Exaggeration", leading to loss of credibility and suspicions of possible secondary gains such as obtaining more narcotics, or wanting to appear disabled, for fraudulent reasons. 6. Only pain levels of 5 or below will be seen at our facility. 7. Pain levels of 6 and above will be sent to the Emergency Department and the appointment cancelled. ____________________________________________________________________________________________

## 2018-04-01 ENCOUNTER — Ambulatory Visit (INDEPENDENT_AMBULATORY_CARE_PROVIDER_SITE_OTHER): Payer: 59

## 2018-04-01 VITALS — BP 100/60 | HR 70 | Temp 98.1°F | Resp 12 | Ht 68.0 in | Wt 249.3 lb

## 2018-04-01 DIAGNOSIS — Z0000 Encounter for general adult medical examination without abnormal findings: Secondary | ICD-10-CM

## 2018-04-01 NOTE — Progress Notes (Addendum)
Subjective:   John Garrett is a 57 y.o. male who presents for Medicare Annual/Subsequent preventive examination.  Review of Systems:  N/A Cardiac Risk Factors include: advanced age (>69men, >31 women);male gender;dyslipidemia;obesity (BMI >30kg/m2);smoking/ tobacco exposure     Objective:    Vitals: BP 100/60 (BP Location: Left Arm, Patient Position: Sitting, Cuff Size: Normal)   Pulse 70   Temp 98.1 F (36.7 C) (Oral)   Resp 12   Ht 5\' 8"  (1.727 m)   Wt 249 lb 4.8 oz (113.1 kg)   SpO2 94%   BMI 37.91 kg/m   Body mass index is 37.91 kg/m.  Advanced Directives 04/01/2018 01/15/2018 07/30/2017 07/18/2017 06/19/2017 05/20/2017 04/16/2017  Does Patient Have a Medical Advance Directive? No No No No No No No  Would patient like information on creating a medical advance directive? Yes (MAU/Ambulatory/Procedural Areas - Information given) No - Patient declined - - - - -    Tobacco Social History   Tobacco Use  Smoking Status Current Every Day Smoker  . Packs/day: 2.00  . Years: 10.00  . Pack years: 20.00  . Types: Cigarettes  . Last attempt to quit: 01/07/2013  . Years since quitting: 5.2  Smokeless Tobacco Former Systems developer  . Types: Chew, Snuff  . Quit date: 2001  Tobacco Comment   taking Wellbutrin     Ready to quit: Yes Counseling given: Yes Comment: taking Wellbutrin  Clinical Intake:  Pre-visit preparation completed: Yes  Pain : No/denies pain Pain Type: Chronic pain   BMI - recorded: 37.91 Nutritional Status: BMI > 30  Obese Nutritional Risks: None Diabetes: No  How often do you need to have someone help you when you read instructions, pamphlets, or other written materials from your doctor or pharmacy?: 1 - Never  Interpreter Needed?: No  Information entered by :: AEversole, LPN  Past Medical History:  Diagnosis Date  . Anxiety   . Chronic back pain   . Chronic pain   . Depression   . Hyperlipidemia   . Prediabetes 01/08/2018   Past Surgical History:    Procedure Laterality Date  . CATARACT EXTRACTION Right 2014   Family History  Family history unknown: Yes   Social History   Socioeconomic History  . Marital status: Divorced    Spouse name: Not on file  . Number of children: 1  . Years of education: Not on file  . Highest education level: 10th grade  Occupational History  . Occupation: disabled     Comment: since 2004, accident at work 2001, used to do Architect and also Horticulturist, commercial   Social Needs  . Financial resource strain: Not hard at all  . Food insecurity:    Worry: Never true    Inability: Never true  . Transportation needs:    Medical: No    Non-medical: No  Tobacco Use  . Smoking status: Current Every Day Smoker    Packs/day: 2.00    Years: 10.00    Pack years: 20.00    Types: Cigarettes    Last attempt to quit: 01/07/2013    Years since quitting: 5.2  . Smokeless tobacco: Former Systems developer    Types: Chew, [place] date: 2001  . Tobacco comment: taking Wellbutrin  Substance and Sexual Activity  . Alcohol use: Yes    Alcohol/week: 7.2 oz    Types: 12 Cans of beer per week  . Drug use: No  . Sexual activity: Not Currently    Partners:  Female  Lifestyle  . Physical activity:    Days per week: 5 days    Minutes per session: 20 min  . Stress: Not at all  Relationships  . Social connections:    Talks on phone: Patient refused    Gets together: Patient refused    Attends religious service: Patient refused    Active member of club or organization: Patient refused    Attends meetings of clubs or organizations: Patient refused    Relationship status: Divorced  Other Topics Concern  . Not on file  Social History Narrative  . Not on file    Outpatient Encounter Medications as of 04/01/2018  Medication Sig  . buPROPion (WELLBUTRIN XL) 300 MG 24 hr tablet Take 1 tablet (300 mg total) by mouth daily.  . busPIRone (BUSPAR) 15 MG tablet Take 1 tablet (15 mg total) by mouth 2 (two) times daily.  .  citalopram (CELEXA) 40 MG tablet Take 1 tablet (40 mg total) by mouth daily.  Marland Kitchen ibuprofen (ADVIL,MOTRIN) 800 MG tablet Take 800 mg by mouth every 8 (eight) hours as needed.  Marland Kitchen oxyCODONE-acetaminophen (PERCOCET) 5-325 MG tablet Take 1 tablet by mouth every 4 (four) hours as needed for severe pain.  . rosuvastatin (CRESTOR) 40 MG tablet Take 1 tablet (40 mg total) by mouth daily.  Marland Kitchen tiZANidine (ZANAFLEX) 2 MG tablet Take by mouth daily.   No facility-administered encounter medications on file as of 04/01/2018.     Activities of Daily Living In your present state of health, do you have any difficulty performing the following activities: 04/01/2018 01/07/2018  Hearing? N N  Comment denies hearing aids -  Vision? N N  Comment wears eyeglasses -  Difficulty concentrating or making decisions? N N  Walking or climbing stairs? N N  Comment lower back pain -  Dressing or bathing? N N  Doing errands, shopping? N N  Preparing Food and eating ? N -  Comment denies dentures -  Using the Toilet? N -  In the past six months, have you accidently leaked urine? N -  Do you have problems with loss of bowel control? N -  Managing your Medications? N -  Managing your Finances? N -  Housekeeping or managing your Housekeeping? N -  Some recent data might be hidden    Patient Care Team: Steele Sizer, MD as PCP - General (Family Medicine) [name], MD as Consulting Physician (Pain Medicine)   Assessment:   This is a routine wellness examination for [facility].  Exercise Activities and Dietary recommendations Current Exercise Habits: Structured exercise class, Type of exercise: walking;strength training/weights, Time (Minutes): 20, Frequency (Times/Week): 5, Weekly Exercise (Minutes/Week): 100, Intensity: Mild, Exercise limited by: Other - see comments(lower back pain)  Goals    . DIET - INCREASE WATER INTAKE     Recommend to drink at least 6-8 8oz glasses of water per day.       Fall Risk Fall  Risk  04/01/2018 03/26/2018 01/15/2018 01/07/2018 11/27/2017  Falls in the past year? Yes No No No No  Number falls in past yr: 2 or more - - - -  Injury with Fall? No - - - -  Risk Factor Category  High Fall Risk - - - -  Risk for fall due to : Impaired balance/gait;Impaired vision;History of fall(s);Medication side effect - - - -  Risk for fall due to: Comment wears eyeglasses, lower back pain - - - -  Follow up Falls evaluation completed;Education provided;Falls  prevention discussed - - - -   FALL RISK PREVENTION PERTAINING TO HOME: Is your home free of loose throw rugs in walkways, pet beds, electrical cords, etc? Yes Is there adequate lighting in your home to reduce risk of falls?  Yes Are there stairs in or around your home WITH handrails? Yes  ASSISTIVE DEVICES UTILIZED TO PREVENT FALLS: Use of a cane, walker or w/c? Yes, cane Grab bars in the bathroom? Yes  Shower chair or a place to sit while bathing? No An elevated toilet seat or a handicapped toilet? No  Timed Get Up and Go Performed: Yes. Pt ambulated 10 feet within 28 sec. Gait slow, steady and without the use of an assistive device. No intervention required at this time. Fall risk prevention has been discussed.  Community Resource Referral:  Pt declined my offer to send [employer] Referral to Care Guide for a shower chair or an elevated toilet seat.  Depression Screen PHQ 2/9 Scores 04/01/2018 03/26/2018 01/15/2018 01/07/2018  PHQ - 2 Score 0 0 2 2  PHQ- 9 Score 1 - 3 3    Cognitive Function     6CIT Screen 04/01/2018 01/21/2017  What Year? 0 points 0 points  What month? 0 points 0 points  What time? 0 points 0 points  Count back from 20 0 points 0 points  Months in reverse 0 points 4 points  Repeat phrase 6 points 2 points  Total Score 6 6    Immunization History  Administered Date(s) Administered  . Influenza,inj,Quad PF,6+ Mos 07/01/2014, 09/02/2015, 06/19/2017  . Influenza-Unspecified 07/01/2014  .  Pneumococcal Polysaccharide-23 07/31/2013  . Tdap 08/02/2014, 09/01/2015    Qualifies for Shingles Vaccine? Yes. Due for Shingrix. Education has been provided regarding the importance of this vaccine. Pt has been advised to call his insurance company to determine his out of pocket expense. Advised he may also receive this vaccine at his local pharmacy or Health Dept. Verbalized acceptance and understanding.  Screening Tests Health Maintenance  Topic Date Due  . INFLUENZA VACCINE  05/01/2018  . COLONOSCOPY  06/05/2023  . TETANUS/TDAP  08/31/2025  . Hepatitis C Screening  Completed  . HIV Screening  Completed   Cancer Screenings: Lung: Low Dose CT Chest recommended if Age 5-80 years, 30 pack-year currently smoking OR have quit w/in 15years. Patient does not qualify. Colorectal: Completed 06/04/13. Repeat every 10 years  Additional Screenings: Hepatitis C Screening: Completed 01/07/18     Plan:  I have personally reviewed and addressed the Medicare Annual Wellness questionnaire and have noted the following in the patient's chart:  A. Medical and social history B. Use of alcohol, tobacco or illicit drugs  C. Current medications and supplements D. Functional ability and status E.  Nutritional status F.  Physical activity G. Advance directives H. List of other physicians I.  Hospitalizations, surgeries, and ER visits in previous 12 months J.  [place] such as hearing and vision if needed, cognitive and depression L. Referrals and appointments  In addition, I have reviewed and discussed with patient certain preventive protocols, quality metrics, and best practice recommendations. A written personalized care plan for preventive services as well as general preventive health recommendations were provided to patient.  See attached scanned questionnaire for additional information.   Signed,  Aleatha Borer, LPN Nurse Health Advisor  I have reviewed this encounter including  the documentation in this note and/or discussed this patient with the provider, Aleatha Borer, LPN. I am certifying that I agree  with the content of this note as supervising physician.  Steele Sizer, MD [facility] 04/01/2018, 12:56 PM

## 2018-04-01 NOTE — Patient Instructions (Signed)
John Garrett , Thank you for taking time to come for your Medicare Wellness Visit. I appreciate your ongoing commitment to your health goals. Please review the following plan we discussed and let me know if I can assist you in the future.   Screening recommendations/referrals: Colorectal Screening: Up to date Lung Cancer Screening: You do not qualify for this screening Hepatitis C Screening: Up to date  Vision and Dental Exams: Recommended annual ophthalmology exams for early detection of glaucoma and other disorders of the eye Recommended annual dental exams for proper oral hygiene  Vaccinations: Influenza vaccine: Up to date Pneumococcal vaccine: Not yet required Tdap vaccine: Up to date Shingles vaccine: Please call your insurance company to determine your out of pocket expense for the Shingrix vaccine. You may also receive this vaccine at your local pharmacy or Health Dept.   Advanced directives: Advance directive discussed with you today. I have provided a copy for you to complete at home and have notarized. Once this is complete please bring a copy in to our office so we can scan it into your chart.  Goals: Recommend to drink at least 6-8 8oz glasses of water per day.  Next appointment: Please schedule your Annual Wellness Visit with your Nurse Health Advisor in one year.  Preventive Care 40-64 Years, Male Preventive care refers to lifestyle choices and visits with your health care provider that can promote health and wellness. What does preventive care include?  A yearly physical exam. This is also called an annual well check.  Dental exams once or twice a year.  Routine eye exams. Ask your health care provider how often you should have your eyes checked.  Personal lifestyle choices, including:  Daily care of your teeth and gums.  Regular physical activity.  Eating a healthy diet.  Avoiding tobacco and drug use.  Limiting alcohol use.  Practicing safe sex.  Taking  low-dose aspirin every day starting at age 37. What happens during an annual well check? The services and screenings done by your health care provider during your annual well check will depend on your age, overall health, lifestyle risk factors, and family history of disease. Counseling  Your health care provider may ask you questions about your:  Alcohol use.  Tobacco use.  Drug use.  Emotional well-being.  Home and relationship well-being.  Sexual activity.  Eating habits.  Work and work Statistician. Screening  You may have the following tests or measurements:  Height, weight, and BMI.  Blood pressure.  Lipid and cholesterol levels. These may be checked every 5 years, or more frequently if you are over 61 years old.  Skin check.  Lung cancer screening. You may have this screening every year starting at age 3 if you have a 30-pack-year history of smoking and currently smoke or have quit within the past 15 years.  Fecal occult blood test (FOBT) of the stool. You may have this test every year starting at age 39.  Flexible sigmoidoscopy or colonoscopy. You may have a sigmoidoscopy every 5 years or a colonoscopy every 10 years starting at age 12.  Prostate cancer screening. Recommendations will vary depending on your family history and other risks.  Hepatitis C blood test.  Hepatitis B blood test.  Sexually transmitted disease (STD) testing.  Diabetes screening. This is done by checking your blood sugar (glucose) after you have not eaten for a while (fasting). You may have this done every 1-3 years. Discuss your test results, treatment options, and if necessary,  the need for more tests with your health care provider. Vaccines  Your health care provider may recommend certain vaccines, such as:  Influenza vaccine. This is recommended every year.  Tetanus, diphtheria, and acellular pertussis (Tdap, Td) vaccine. You may need a Td booster every 10 years.  Zoster  vaccine. You may need this after age 59.  Pneumococcal 13-valent conjugate (PCV13) vaccine. You may need this if you have certain conditions and have not been vaccinated.  Pneumococcal polysaccharide (PPSV23) vaccine. You may need one or two doses if you smoke cigarettes or if you have certain conditions. Talk to your health care provider about which screenings and vaccines you need and how often you need them. This information is not intended to replace advice given to you by your health care provider. Make sure you discuss any questions you have with your health care provider. Document Released: 10/14/2015 Document Revised: 06/06/2016 Document Reviewed: 07/19/2015 Elsevier Interactive Patient Education  2017 [place] Prevention in the Home Falls can cause injuries. They can happen to people of all ages. There are many things you can do to make your home safe and to help prevent falls. What can I do on the outside of my home?  Regularly fix the edges of walkways and driveways and fix any cracks.  Remove anything that might make you trip as you walk through a door, such as a raised step or threshold.  Trim any bushes or trees on the path to your home.  Use bright outdoor lighting.  Clear any walking paths of anything that might make someone trip, such as rocks or tools.  Regularly check to see if handrails are loose or broken. Make sure that both sides of any steps have handrails.  Any raised decks and porches should have guardrails on the edges.  Have any leaves, snow, or ice cleared regularly.  Use sand or salt on walking paths during winter.  Clean up any spills in your garage right away. This includes oil or grease spills. What can I do in the bathroom?  Use night lights.  Install grab bars by the toilet and in the tub and shower. Do not use towel bars as grab bars.  Use non-skid mats or decals in the tub or shower.  If you need to sit down in the shower, use  a plastic, non-slip stool.  Keep the floor dry. Clean up any water that spills on the floor as soon as it happens.  Remove soap buildup in the tub or shower regularly.  Attach bath mats securely with double-sided non-slip rug tape.  Do not have throw rugs and other things on the floor that can make you trip. What can I do in the bedroom?  Use night lights.  Make sure that you have a light by your bed that is easy to reach.  Do not use any sheets or blankets that are too big for your bed. They should not hang down onto the floor.  Have a firm chair that has side arms. You can use this for support while you get dressed.  Do not have throw rugs and other things on the floor that can make you trip. What can I do in the kitchen?  Clean up any spills right away.  Avoid walking on wet floors.  Keep items that you use a lot in easy-to-reach places.  If you need to reach something above you, use a strong step stool that has a grab bar.  Keep  electrical cords out of the way.  Do not use floor polish or wax that makes floors slippery. If you must use wax, use non-skid floor wax.  Do not have throw rugs and other things on the floor that can make you trip. What can I do with my stairs?  Do not leave any items on the stairs.  Make sure that there are handrails on both sides of the stairs and use them. Fix handrails that are broken or loose. Make sure that handrails are as long as the stairways.  Check any carpeting to make sure that it is firmly attached to the stairs. Fix any carpet that is loose or worn.  Avoid having throw rugs at the top or bottom of the stairs. If you do have throw rugs, attach them to the floor with carpet tape.  Make sure that you have a light switch at the top of the stairs and the bottom of the stairs. If you do not have them, ask someone to add them for you. What else can I do to help prevent falls?  Wear shoes that:  Do not have high heels.  Have  rubber bottoms.  Are comfortable and fit you well.  Are closed at the toe. Do not wear sandals.  If you use a stepladder:  Make sure that it is fully opened. Do not climb a closed stepladder.  Make sure that both sides of the stepladder are locked into place.  Ask someone to hold it for you, if possible.  Clearly mark and make sure that you can see:  Any grab bars or handrails.  First and last steps.  Where the edge of each step is.  Use tools that help you move around (mobility aids) if they are needed. These include:  Canes.  Walkers.  Scooters.  Crutches.  Turn on the lights when you go into a dark area. Replace any light bulbs as soon as they burn out.  Set up your furniture so you have a clear path. Avoid moving your furniture around.  If any of your floors are uneven, fix them.  If there are any pets around you, be aware of where they are.  Review your medicines with your doctor. Some medicines can make you feel dizzy. This can increase your chance of falling. Ask your doctor what other things that you can do to help prevent falls. This information is not intended to replace advice given to you by your health care provider. Make sure you discuss any questions you have with your health care provider. Document Released: 07/14/2009 Document Revised: 02/23/2016 Document Reviewed: 10/22/2014 Elsevier Interactive Patient Education  2017 [employer].

## 2018-04-11 ENCOUNTER — Encounter: Payer: Self-pay | Admitting: Family Medicine

## 2018-04-11 ENCOUNTER — Ambulatory Visit: Payer: Medicare PPO | Admitting: Family Medicine

## 2018-04-11 VITALS — BP 118/86 | HR 72 | Temp 98.5°F | Resp 16 | Ht 68.0 in | Wt 250.6 lb

## 2018-04-11 DIAGNOSIS — F411 Generalized anxiety disorder: Secondary | ICD-10-CM

## 2018-04-11 DIAGNOSIS — F331 Major depressive disorder, recurrent, moderate: Secondary | ICD-10-CM

## 2018-04-11 DIAGNOSIS — M5416 Radiculopathy, lumbar region: Secondary | ICD-10-CM

## 2018-04-11 DIAGNOSIS — E8881 Metabolic syndrome: Secondary | ICD-10-CM

## 2018-04-11 DIAGNOSIS — E782 Mixed hyperlipidemia: Secondary | ICD-10-CM | POA: Diagnosis not present

## 2018-04-11 MED ORDER — ROSUVASTATIN CALCIUM 40 MG PO TABS: 40.0000 mg | ORAL_TABLET | Freq: Every day | ORAL | 0 refills | Status: DC
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
## 2018-04-22 ENCOUNTER — Encounter: Payer: Self-pay | Admitting: Student in an Organized Health Care Education/Training Program

## 2018-04-22 ENCOUNTER — Other Ambulatory Visit: Payer: Self-pay

## 2018-04-22 ENCOUNTER — Ambulatory Visit
Payer: Medicare PPO | Attending: Student in an Organized Health Care Education/Training Program | Admitting: Student in an Organized Health Care Education/Training Program

## 2018-04-22 VITALS — BP 102/77 | HR 65 | Temp 98.0°F | Resp 18 | Ht 68.0 in | Wt 250.0 lb

## 2018-04-22 DIAGNOSIS — M47817 Spondylosis without myelopathy or radiculopathy, lumbosacral region: Secondary | ICD-10-CM | POA: Diagnosis not present

## 2018-04-22 DIAGNOSIS — M5441 Lumbago with sciatica, right side: Secondary | ICD-10-CM | POA: Diagnosis not present

## 2018-04-22 DIAGNOSIS — Z9841 Cataract extraction status, right eye: Secondary | ICD-10-CM | POA: Diagnosis not present

## 2018-04-22 DIAGNOSIS — G894 Chronic pain syndrome: Secondary | ICD-10-CM | POA: Diagnosis not present

## 2018-04-22 DIAGNOSIS — M47816 Spondylosis without myelopathy or radiculopathy, lumbar region: Secondary | ICD-10-CM

## 2018-04-22 DIAGNOSIS — M79605 Pain in left leg: Secondary | ICD-10-CM

## 2018-04-22 DIAGNOSIS — M5136 Other intervertebral disc degeneration, lumbar region: Secondary | ICD-10-CM | POA: Diagnosis not present

## 2018-04-22 DIAGNOSIS — F329 Major depressive disorder, single episode, unspecified: Secondary | ICD-10-CM | POA: Diagnosis not present

## 2018-04-22 DIAGNOSIS — M542 Cervicalgia: Secondary | ICD-10-CM | POA: Diagnosis present

## 2018-04-22 DIAGNOSIS — M545 Low back pain: Secondary | ICD-10-CM | POA: Diagnosis present

## 2018-04-22 DIAGNOSIS — M25512 Pain in left shoulder: Secondary | ICD-10-CM | POA: Insufficient documentation

## 2018-04-22 DIAGNOSIS — M5116 Intervertebral disc disorders with radiculopathy, lumbar region: Secondary | ICD-10-CM | POA: Insufficient documentation

## 2018-04-22 DIAGNOSIS — G8929 Other chronic pain: Secondary | ICD-10-CM

## 2018-04-22 DIAGNOSIS — M79604 Pain in right leg: Secondary | ICD-10-CM

## 2018-04-22 DIAGNOSIS — Z87891 Personal history of nicotine dependence: Secondary | ICD-10-CM | POA: Insufficient documentation

## 2018-04-22 DIAGNOSIS — M51369 Other intervertebral disc degeneration, lumbar region without mention of lumbar back pain or lower extremity pain: Secondary | ICD-10-CM

## 2018-04-22 DIAGNOSIS — M5412 Radiculopathy, cervical region: Secondary | ICD-10-CM | POA: Insufficient documentation

## 2018-04-22 DIAGNOSIS — F411 Generalized anxiety disorder: Secondary | ICD-10-CM | POA: Diagnosis not present

## 2018-04-22 DIAGNOSIS — Z79891 Long term (current) use of opiate analgesic: Secondary | ICD-10-CM | POA: Diagnosis not present

## 2018-04-22 DIAGNOSIS — M4802 Spinal stenosis, cervical region: Secondary | ICD-10-CM | POA: Diagnosis not present

## 2018-04-22 DIAGNOSIS — R7303 Prediabetes: Secondary | ICD-10-CM | POA: Insufficient documentation

## 2018-04-22 DIAGNOSIS — M5481 Occipital neuralgia: Secondary | ICD-10-CM | POA: Diagnosis not present

## 2018-04-22 DIAGNOSIS — E785 Hyperlipidemia, unspecified: Secondary | ICD-10-CM | POA: Diagnosis not present

## 2018-04-22 DIAGNOSIS — Z760 Encounter for issue of repeat prescription: Secondary | ICD-10-CM | POA: Diagnosis present

## 2018-04-22 DIAGNOSIS — M5410 Radiculopathy, site unspecified: Secondary | ICD-10-CM

## 2018-04-22 DIAGNOSIS — Z79899 Other long term (current) drug therapy: Secondary | ICD-10-CM | POA: Diagnosis not present

## 2018-04-22 DIAGNOSIS — M4726 Other spondylosis with radiculopathy, lumbar region: Secondary | ICD-10-CM

## 2018-04-22 MED ORDER — OXYCODONE-ACETAMINOPHEN 5-325 MG PO TABS: 1.0000 | ORAL_TABLET | ORAL | 0 refills | Status: DC | PRN

## 2018-04-22 NOTE — Progress Notes (Signed)
Nursing Pain Medication Assessment:  Safety precautions to be maintained throughout the outpatient stay will include: orient to surroundings, keep bed in low position, maintain call bell within reach at all times, provide assistance with transfer out of bed and ambulation.  Medication Inspection Compliance: Pill count conducted under aseptic conditions, in front of the patient. Neither the pills nor the bottle was removed from the patient's sight at any time. Once count was completed pills were immediately returned to the patient in their original bottle.  Medication: Oxycodone/APAP Pill/Patch Count: 15 of 180 pills remain Pill/Patch Appearance: Markings consistent with prescribed medication Bottle Appearance: Standard pharmacy container. Clearly labeled. Filled Date:6 /26 / 2019 Last Medication intake:  Today

## 2018-04-22 NOTE — Patient Instructions (Signed)
You have been given 3 scripts for Hydrocodone today.

## 2018-04-22 NOTE — Progress Notes (Signed)
Patient's Name: John Garrett  MRN: [id number]  Referring Provider: Steele Sizer, MD  DOB: Nov 23, 1960  PCP: Steele Sizer, MD  DOS: 04/22/2018  Note by: [name], MD  Service setting: Ambulatory outpatient  Specialty: Interventional Pain Management  Location: [facility] (AMB) Pain Management Facility    Patient type: Established   Primary Reason(s) for Visit: Encounter for prescription drug management. (Level of risk: moderate)  CC: Neck Pain and Back Pain  HPI  John Garrett is a 57 y.o. year old, male patient, who comes today for a medication management evaluation. He has Chronic lumbar radiculopathy (S1) (Right); Continuous opioid dependence ([place]); GAD (generalized anxiety disorder); Major depression, chronic; Morbid obesity ([place]); Dyslipidemia; Polypharmacy; Nocturnal leg cramps; Prediabetes; Chronic pain syndrome; Lumbar spondylosis; Chronic low back pain (Primary Area of Pain) (Bilateral) (R>L) w/ sciatica (Right); Lumbar facet joint syndrome (Bilateral) (R>L); Spondylosis without myelopathy or radiculopathy, lumbosacral region; Chronic lower extremity pain (Secondary Area of Pain) (Bilateral) (R>L); Chronic radicular pain of lower extremity (S1 dermatome) (Right); Chronic neck pain (Tertiary Area of Pain) (Bilateral) (R>L); Cervicogenic headache (Right); Unilateral occipital headache (Right); Cervico-occipital neuralgia (Fourth Area of Pain) (Right); Chronic shoulder pain (Fifth Area of Pain) (Left); Numbness and tingling of arm (Right); Cervical radiculitis (C7/C8) (Right); Osteoarthritis of spine with radiculopathy, lumbar region (S1) (Right); DDD (degenerative disc disease), lumbar; Lumbar facet arthropathy (Bilateral); DDD (degenerative disc disease), thoracic; Thoracic Levoscoliosis; Cervical foraminal stenosis (C3-4, C5-6) (Left); DDD (degenerative disc disease), cervical; and Cervicalgia on their problem list. His primarily concern today is the Neck Pain and Back Pain  Pain  Assessment: Location: Left Neck(back) Radiating: radiates down to left hand at times Onset: More than a month ago Duration: Chronic pain Quality: Aching, Constant Severity: 7 /10 (subjective, self-reported pain score)  Note: Reported level is inconsistent with clinical observations.                         When using our objective Pain Scale, levels between 6 and 10/10 are said to belong in an emergency room, as it progressively worsens from a 6/10, described as severely limiting, requiring emergency care not usually available at an outpatient pain management facility. At a 6/10 level, communication becomes difficult and requires great effort. Assistance to reach the emergency department may be required. Facial flushing and profuse sweating along with potentially dangerous increases in heart rate and blood pressure will be evident. Effect on ADL: Limits activities Timing: Constant Modifying factors: shower, tiger balm, meds BP: 102/77  HR: 65  [name] was last scheduled for an appointment on 11/27/2017 for medication management. During today's appointment we reviewed John Garrett chronic pain status, as well as his outpatient medication regimen.  The patient  reports that he does not use drugs. His body mass index is 38.01 kg/m.  Further details on both, my assessment(s), as well as the proposed treatment plan, please see below.  Controlled Substance Pharmacotherapy Assessment REMS (Risk Evaluation and Mitigation Strategy)  Analgesic:Oxycodone 5 mg up to 6 times a day as needed for chronic pain. MME/day:Approximately 45-9m/day.   TDewayne Shorter RN  04/22/2018 10:27 AM  Signed Nursing Pain Medication Assessment:  Safety precautions to be maintained throughout the outpatient stay will include: orient to surroundings, keep bed in low position, maintain call bell within reach at all times, provide assistance with transfer out of bed and ambulation.  Medication Inspection Compliance: Pill  count conducted under aseptic conditions, in front of the patient. Neither the pills nor  the bottle was removed from the patient's sight at any time. Once count was completed pills were immediately returned to the patient in their original bottle.  Medication: Oxycodone/APAP Pill/Patch Count: 15 of 180 pills remain Pill/Patch Appearance: Markings consistent with prescribed medication Bottle Appearance: Standard pharmacy container. Clearly labeled. Filled Date:6 /26 / 2019 Last Medication intake:  Today Pharmacokinetics: Liberation and absorption (onset of action): WNL Distribution (time to peak effect): WNL Metabolism and excretion (duration of action): WNL         Pharmacodynamics: Desired effects: Analgesia: John Garrett reports >50% benefit. Functional ability: Patient reports that medication allows him to accomplish basic ADLs Clinically meaningful improvement in function (CMIF): Sustained CMIF goals met Perceived effectiveness: Described as relatively effective, allowing for increase in activities of daily living (ADL) Undesirable effects: Side-effects or Adverse reactions: None reported Monitoring: [place] PMP: Online review of the past 74-monthperiod conducted. Compliant with practice rules and regulations Last UDS on record: Summary  Date Value Ref Range Status  01/15/2018 FINAL  Final    Comment:    ==================================================================== TOXASSURE SELECT 13 (MW) ==================================================================== Test                             Result       Flag       Units Drug Present and Declared for Prescription Verification   Oxycodone                      163          EXPECTED   ng/mg creat   Noroxycodone                   680          EXPECTED   ng/mg creat    Sources of oxycodone include scheduled prescription medications.    Noroxycodone is an expected metabolite of  oxycodone. ==================================================================== Test                      Result    Flag   Units      Ref Range   Creatinine              174              mg/dL      >=20 ==================================================================== Declared Medications:  The flagging and interpretation on this report are based on the  following declared medications.  Unexpected results may arise from  inaccuracies in the declared medications.  **Note: The testing scope of this panel includes these medications:  Oxycodone (Oxycodone Acetaminophen)  **Note: The testing scope of this panel does not include following  reported medications:  Acetaminophen (Oxycodone Acetaminophen)  Bupropion  Buspirone  Citalopram  Ibuprofen  Rosuvastatin  Tizanidine ==================================================================== For clinical consultation, please call [phone] ====================================================================    UDS interpretation: Compliant          Medication Assessment Form: Reviewed. Patient indicates being compliant with therapy Treatment compliance: Compliant Risk Assessment Profile: Aberrant behavior: See prior evaluations. None observed or detected today Comorbid factors increasing risk of overdose: See prior notes. No additional risks detected today Risk of substance use disorder (SUD): Low Opioid Risk Tool - 03/26/18 1154      Family History of Substance Abuse   Alcohol  Negative    Illegal Drugs  Negative    Rx Drugs  Negative      Personal  History of Substance Abuse   Alcohol  Negative    Illegal Drugs  Negative    Rx Drugs  Negative      Age   Age between 80-45 years   No      History of Preadolescent Sexual Abuse   History of Preadolescent Sexual Abuse  Negative or Male      Psychological Disease   Psychological Disease  Negative    Depression  Negative      Total Score   Opioid Risk Tool Scoring  0     Opioid Risk Interpretation  Low Risk      ORT Scoring interpretation table:  Score <3 = Low Risk for SUD  Score between 4-7 = Moderate Risk for SUD  Score >8 = High Risk for Opioid Abuse   Risk Mitigation Strategies:  Patient Counseling: Covered Patient-Prescriber Agreement (PPA): Present and active  Notification to other healthcare providers: Done  Pharmacologic Plan: No change in therapy, at this time.             Laboratory Chemistry  Inflammation Markers (CRP: Acute Phase) (ESR: Chronic Phase) No results found for: CRP, ESRSEDRATE, LATICACIDVEN                       Rheumatology Markers No results found for: RF, ANA, LABURIC, URICUR, LYMEIGGIGMAB, LYMEABIGMQN, HLAB27                      Renal Function Markers Lab Results  Component Value Date   BUN 17 01/07/2018   CREATININE 1.04 88/41/6606   BCR NOT APPLICABLE 30/16/0109   GFRAA 93 01/07/2018   GFRNONAA 80 01/07/2018                             Hepatic Function Markers Lab Results  Component Value Date   AST 23 01/07/2018   ALT 33 01/07/2018                        Electrolytes Lab Results  Component Value Date   NA 136 01/07/2018   K 4.9 01/07/2018   CL 103 01/07/2018   CALCIUM 9.7 01/07/2018                        Neuropathy Markers Lab Results  Component Value Date   HGBA1C 5.8 (H) 01/07/2018   HIV NON-REACTIVE 01/07/2018                        Bone Pathology Markers No results found for: [place], VD125OH2TOT, NA3557DU2, GU5427CW2, 25OHVITD1, 25OHVITD2, 25OHVITD3, TESTOFREE, TESTOSTERONE                       Coagulation Parameters Lab Results  Component Value Date   PLT 284 01/07/2018                        Cardiovascular Markers Lab Results  Component Value Date   HGB 16.3 01/07/2018   HCT 47.1 01/07/2018                         CA Markers No results found for: CEA, CA125, LABCA2                      Note:  Lab results reviewed.  Recent Diagnostic Imaging Results  DG C-Arm 1-60  Min-No Report Fluoroscopy was utilized by the requesting physician.  No radiographic  interpretation.   Complexity Note: Imaging results reviewed. Results shared with Mr. Altamura, using Layman's terms.                         Meds   Current Outpatient Medications:  .  buPROPion (WELLBUTRIN XL) 300 MG 24 hr tablet, Take 1 tablet (300 mg total) by mouth daily., Disp: 90 tablet, Rfl: 1 .  busPIRone (BUSPAR) 15 MG tablet, Take 1 tablet (15 mg total) by mouth 2 (two) times daily., Disp: 180 tablet, Rfl: 1 .  citalopram (CELEXA) 40 MG tablet, Take 1 tablet (40 mg total) by mouth daily., Disp: 90 tablet, Rfl: 1 .  ibuprofen (ADVIL,MOTRIN) 800 MG tablet, Take 800 mg by mouth every 8 (eight) hours as needed., Disp: , Rfl:  .  oxyCODONE-acetaminophen (PERCOCET) 5-325 MG tablet, Take 1 tablet by mouth every 4 (four) hours as needed for severe pain., Disp: 180 tablet, Rfl: 0 .  rosuvastatin (CRESTOR) 40 MG tablet, Take 1 tablet (40 mg total) by mouth daily., Disp: 90 tablet, Rfl: 0 .  tiZANidine (ZANAFLEX) 2 MG tablet, Take by mouth daily., Disp: , Rfl:   ROS  Constitutional: Denies any fever or chills Gastrointestinal: No reported hemesis, hematochezia, vomiting, or acute GI distress Musculoskeletal: Denies any acute onset joint swelling, redness, loss of ROM, or weakness Neurological: No reported episodes of acute onset apraxia, aphasia, dysarthria, agnosia, amnesia, paralysis, loss of coordination, or loss of consciousness  Allergies  Mr. Poynter has No Known Allergies.  [place]  Drug: Mr. Radi  reports that he does not use drugs. Alcohol:  reports that he drinks about 7.2 oz of alcohol per week. Tobacco:  reports that he quit smoking about 5 years ago. His smoking use included cigarettes. He has a 20.00 pack-year smoking history. He quit smokeless tobacco use about 18 years ago. His smokeless tobacco use included chew and snuff. Medical:  has a past medical history of Anxiety, Chronic back pain,  Chronic pain, Depression, Hyperlipidemia, and Prediabetes (01/08/2018). Surgical: Mr. Gander  has a past surgical history that includes Cataract extraction (Right, 2014). Family: Family history is unknown by patient.  Constitutional Exam  General appearance: Well nourished, well developed, and well hydrated. In no apparent acute distress Vitals:   04/22/18 1022  BP: 102/77  Pulse: 65  Resp: 18  Temp: 98 F (36.7 C)  SpO2: 99%  Weight: 250 lb (113.4 kg)  Height: 5' 8"  (1.727 m)   BMI Assessment: Estimated body mass index is 38.01 kg/m as calculated from the following:   Height as of this encounter: 5' 8"  (1.727 m).   Weight as of this encounter: 250 lb (113.4 kg).  BMI interpretation table: BMI level Category Range association with higher incidence of chronic pain  <18 kg/m2 Underweight   18.5-24.9 kg/m2 Ideal body weight   25-29.9 kg/m2 Overweight Increased incidence by 20%  30-34.9 kg/m2 Obese (Class I) Increased incidence by 68%  35-39.9 kg/m2 Severe obesity (Class II) Increased incidence by 136%  >40 kg/m2 Extreme obesity (Class III) Increased incidence by 254%   Patient's current BMI Ideal Body weight  Body mass index is 38.01 kg/m. Ideal body weight: 68.4 kg (150 lb 12.7 oz) Adjusted ideal body weight: 86.4 kg (190 lb 7.6 oz)   BMI Readings from Last 4 Encounters:  04/22/18 38.01 kg/m  04/11/18 38.10 kg/m  04/01/18 37.91 kg/m  03/26/18 40.14 kg/m   Wt Readings from Last 4 Encounters:  04/22/18 250 lb (113.4 kg)  04/11/18 250 lb 9.6 oz (113.7 kg)  04/01/18 249 lb 4.8 oz (113.1 kg)  03/26/18 264 lb (119.7 kg)  Psych/Mental status: Alert, oriented x 3 (person, place, & time)       Eyes: PERLA Respiratory: No evidence of acute respiratory distress  Cervical Spine Area Exam  Skin & Axial Inspection: No masses, redness, edema, swelling, or associated skin lesions Alignment: Symmetrical Functional ROM: Unrestricted ROM      Stability: No instability  detected Muscle Tone/Strength: Functionally intact. No obvious neuro-muscular anomalies detected. Sensory (Neurological): Unimpaired Palpation: No palpable anomalies              Upper Extremity (UE) Exam    Side: Right upper extremity  Side: Left upper extremity  Skin & Extremity Inspection: Skin color, temperature, and hair growth are WNL. No peripheral edema or cyanosis. No masses, redness, swelling, asymmetry, or associated skin lesions. No contractures.  Skin & Extremity Inspection: Skin color, temperature, and hair growth are WNL. No peripheral edema or cyanosis. No masses, redness, swelling, asymmetry, or associated skin lesions. No contractures.  Functional ROM: Unrestricted ROM          Functional ROM: Unrestricted ROM          Muscle Tone/Strength: Functionally intact. No obvious neuro-muscular anomalies detected.  Muscle Tone/Strength: Functionally intact. No obvious neuro-muscular anomalies detected.  Sensory (Neurological): Unimpaired          Sensory (Neurological): Unimpaired          Palpation: No palpable anomalies              Palpation: No palpable anomalies              Provocative Test(s):  Phalen's test: deferred Tinel's test: deferred Apley's scratch test (touch opposite shoulder):  Action 1 (Across chest): deferred Action 2 (Overhead): deferred Action 3 (LB reach): deferred   Provocative Test(s):  Phalen's test: deferred Tinel's test: deferred Apley's scratch test (touch opposite shoulder):  Action 1 (Across chest): deferred Action 2 (Overhead): deferred Action 3 (LB reach): deferred    Thoracic Spine Area Exam  Skin & Axial Inspection: No masses, redness, or swelling Alignment: Symmetrical Functional ROM: Unrestricted ROM Stability: No instability detected Muscle Tone/Strength: Functionally intact. No obvious neuro-muscular anomalies detected. Sensory (Neurological): Unimpaired Muscle strength & Tone: No palpable anomalies  Lumbar Spine Area Exam  Skin &  Axial Inspection: No masses, redness, or swelling Alignment: Symmetrical Functional ROM: Unrestricted ROM       Stability: No instability detected Muscle Tone/Strength: Functionally intact. No obvious neuro-muscular anomalies detected. Sensory (Neurological): Unimpaired Palpation: No palpable anomalies       Provocative Tests: Lumbar Hyperextension/rotation test: deferred today       Lumbar quadrant test (Kemp's test): deferred today       Lumbar Lateral bending test: deferred today       Patrick's Maneuver: deferred today                   FABER test: deferred today                   Thigh-thrust test: deferred today       S-I compression test: deferred today       S-I distraction test: deferred today        Gait & Posture Assessment  Ambulation: Unassisted  Gait: Relatively normal for age and body habitus Posture: WNL   Lower Extremity Exam    Side: Right lower extremity  Side: Left lower extremity  Stability: No instability observed          Stability: No instability observed          Skin & Extremity Inspection: Skin color, temperature, and hair growth are WNL. No peripheral edema or cyanosis. No masses, redness, swelling, asymmetry, or associated skin lesions. No contractures.  Skin & Extremity Inspection: Skin color, temperature, and hair growth are WNL. No peripheral edema or cyanosis. No masses, redness, swelling, asymmetry, or associated skin lesions. No contractures.  Functional ROM: Unrestricted ROM                  Functional ROM: Unrestricted ROM                  Muscle Tone/Strength: Functionally intact. No obvious neuro-muscular anomalies detected.  Muscle Tone/Strength: Functionally intact. No obvious neuro-muscular anomalies detected.  Sensory (Neurological): Unimpaired  Sensory (Neurological): Unimpaired  Palpation: No palpable anomalies  Palpation: No palpable anomalies   Assessment  Primary Diagnosis & Pertinent Problem List: The primary encounter diagnosis was  Chronic low back pain (Primary Area of Pain) (Bilateral) (R>L) w/ sciatica (Right). Diagnoses of DDD (degenerative disc disease), lumbar, Lumbar facet arthropathy (Bilateral), Lumbar facet joint syndrome (Bilateral) (R>L), Spondylosis without myelopathy or radiculopathy, lumbosacral region, Chronic lower extremity pain (Secondary Area of Pain) (Bilateral) (R>L), Osteoarthritis of spine with radiculopathy, lumbar region (S1) (Right), Chronic radicular pain of lower extremity (S1 dermatome) (Right), and Chronic pain syndrome were also pertinent to this visit.  Status Diagnosis  Controlled Controlled Controlled 1. Chronic low back pain (Primary Area of Pain) (Bilateral) (R>L) w/ sciatica (Right)   2. DDD (degenerative disc disease), lumbar   3. Lumbar facet arthropathy (Bilateral)   4. Lumbar facet joint syndrome (Bilateral) (R>L)   5. Spondylosis without myelopathy or radiculopathy, lumbosacral region   6. Chronic lower extremity pain (Secondary Area of Pain) (Bilateral) (R>L)   7. Osteoarthritis of spine with radiculopathy, lumbar region (S1) (Right)   8. Chronic radicular pain of lower extremity (S1 dermatome) (Right)   9. Chronic pain syndrome      General Recommendations: The pain condition that the patient suffers from is best treated with a multidisciplinary approach that involves an increase in physical activity to prevent de-conditioning and worsening of the pain cycle, as well as psychological counseling (formal and/or informal) to address the co-morbid psychological affects of pain. Treatment will often involve judicious use of pain medications and interventional procedures to decrease the pain, allowing the patient to participate in the physical activity that will ultimately produce long-lasting pain reductions. The goal of the multidisciplinary approach is to return the patient to a higher level of overall function and to restore their ability to perform activities of daily  living.  57 year old male with chronic pain secondary to lumbar spondylosis, lumbar degenerative disc disease, lumbar radiculopathy which is chronic status post lumbar epidural steroid injection which is significantly improved the patient's lower extremity cramps and radiating lower externally pain (status post 2 lumbar epidural steroid injections, on 08/12/2017, second on 11/27/2017. )  Patient follows up today for medication refill.  UDS up-to-date and appropriate.  Note: PMP checked and appropriate.  Plan: -Refill oxycodone as below at its current dose of 5 mg every 4-6 hours as needed for severe pain.  Maximum 180 tablets a month.  No further dose escalation beyond  this. -Consider repeating RIGHT lumbar ESI for worsening radicular pain -Continue ibuprofen 800 mg daily as needed -tizanidine 4 mg twice daily as needed muscle spasms.   Plan of Care  Pharmacotherapy (Medications Ordered): Meds ordered this encounter  Medications  . DISCONTD: oxyCODONE-acetaminophen (PERCOCET) 5-325 MG tablet    Sig: Take 1 tablet by mouth every 4 (four) hours as needed for severe pain.    Dispense:  180 tablet    Refill:  0    Do not place this medication, or any other prescription from our practice, on "Automatic Refill"  For chronic pain  To fill on or after:  04/24/18, 05/24/18, 06/23/18  . DISCONTD: oxyCODONE-acetaminophen (PERCOCET) 5-325 MG tablet    Sig: Take 1 tablet by mouth every 4 (four) hours as needed for severe pain.    Dispense:  180 tablet    Refill:  0    Do not place this medication, or any other prescription from our practice, on "Automatic Refill"  For chronic pain  To fill on or after:  04/24/18, 05/24/18, 06/23/18  . oxyCODONE-acetaminophen (PERCOCET) 5-325 MG tablet    Sig: Take 1 tablet by mouth every 4 (four) hours as needed for severe pain.    Dispense:  180 tablet    Refill:  0    Do not place this medication, or any other prescription from our practice, on "Automatic  Refill"  For chronic pain  To fill on or after:  04/24/18, 05/24/18, 06/23/18   Lab-work, procedure(s), and/or referral(s): Orders Placed This Encounter  Procedures  . ToxASSURE Select 13 (MW), Urine    Time Note: Greater than 50% of the 25 minute(s) of face-to-face time spent with Mr. Pinedo, was spent in counseling/coordination of care regarding: the appropriate use of the pain scale, Mr. Mccadden primary cause of pain, the treatment plan, treatment alternatives, the risks and possible complications of proposed treatment, medication side effects, going over the informed consent, the appropriate use of his medications, realistic expectations and the medication agreement.   Provider-requested follow-up: Return in about 3 months (around 07/23/2018) for MM with Crystal.  Future Appointments  Date Time Provider [place]  07/14/2018 11:20 AM Steele Sizer, MD [place] [facility]  07/23/2018 11:00 AM Vevelyn Francois, NP [facility] None  04/09/2019 10:40 AM [place] ADVISOR [place]    Primary Care Physician: Steele Sizer, MD Location: [facility] Note by: [name], M.D Date: 04/22/2018; Time: 1:31 PM  Patient Instructions  You have been given 3 scripts for Hydrocodone today.

## 2018-04-25 LAB — TOXASSURE SELECT 13 (MW), URINE

## 2018-04-28 ENCOUNTER — Other Ambulatory Visit: Payer: Self-pay

## 2018-04-28 NOTE — Telephone Encounter (Signed)
Refill request for general medication. Ibuprofen  Last office visit 04/11/18    Follow up on 07/14/18

## 2018-04-30 ENCOUNTER — Other Ambulatory Visit: Payer: Self-pay

## 2018-04-30 NOTE — Telephone Encounter (Signed)
The requested medication (Ibuprofen) has already been denied by provider.

## 2018-05-13 ENCOUNTER — Other Ambulatory Visit: Payer: Self-pay | Admitting: Family Medicine

## 2018-05-13 NOTE — Telephone Encounter (Signed)
Rx request- please have provider review for refill.

## 2018-05-13 NOTE — Telephone Encounter (Signed)
Copied from [place] [id number]. Topic: Quick Communication - Rx Refill/Question >> May 13, 2018 10:20 AM Margot Ables wrote: Medication: ibuprofen (ADVIL,MOTRIN) 800 MG tablet - pt takes for pain and inflammation of his back - pt requesting 90 day supply Has the patient contacted their pharmacy? Yes - did not receive RX for this Glass blower/designer (with phone number or street name): [place], [place] [phone] (Phone) [phone] (Fax)

## 2018-07-14 ENCOUNTER — Encounter: Payer: Self-pay | Admitting: Family Medicine

## 2018-07-14 ENCOUNTER — Ambulatory Visit: Payer: 59 | Admitting: Family Medicine

## 2018-07-14 VITALS — BP 108/70 | HR 78 | Temp 98.1°F | Ht 68.0 in | Wt 235.7 lb

## 2018-07-14 DIAGNOSIS — F1120 Opioid dependence, uncomplicated: Secondary | ICD-10-CM

## 2018-07-14 DIAGNOSIS — F325 Major depressive disorder, single episode, in full remission: Secondary | ICD-10-CM | POA: Diagnosis not present

## 2018-07-14 DIAGNOSIS — Z23 Encounter for immunization: Secondary | ICD-10-CM

## 2018-07-14 DIAGNOSIS — M5416 Radiculopathy, lumbar region: Secondary | ICD-10-CM

## 2018-07-14 DIAGNOSIS — E782 Mixed hyperlipidemia: Secondary | ICD-10-CM | POA: Diagnosis not present

## 2018-07-14 DIAGNOSIS — F411 Generalized anxiety disorder: Secondary | ICD-10-CM | POA: Diagnosis not present

## 2018-07-14 LAB — COMPLETE METABOLIC PANEL WITH GFR
AG RATIO: 1.6 (calc) (ref 1.0–2.5)
ALT: 23 U/L (ref 9–46)
AST: 19 U/L (ref 10–35)
Albumin: 4.2 g/dL (ref 3.6–5.1)
Alkaline phosphatase (APISO): 59 U/L (ref 40–115)
BUN: 10 mg/dL (ref 7–25)
CALCIUM: 9.5 mg/dL (ref 8.6–10.3)
CO2: 28 mmol/L (ref 20–32)
CREATININE: 0.87 mg/dL (ref 0.70–1.33)
Chloride: 105 mmol/L (ref 98–110)
GFR, EST NON AFRICAN AMERICAN: 96 mL/min/{1.73_m2} (ref >=60)
GFR, Est African American: 112 mL/min/{1.73_m2} (ref >=60)
Globulin: 2.6 g/dL (calc) (ref 1.9–3.7)
Glucose, Bld: 102 mg/dL (ref 65–139)
POTASSIUM: 4.4 mmol/L (ref 3.5–5.3)
Sodium: 139 mmol/L (ref 135–146)
Total Bilirubin: 0.6 mg/dL (ref 0.2–1.2)
Total Protein: 6.8 g/dL (ref 6.1–8.1)

## 2018-07-14 LAB — LIPID PANEL
Cholesterol: 175 mg/dL (ref <200)
HDL: 42 mg/dL (ref >40)
LDL Cholesterol (Calc): 99 mg/dL (calc)
NON-HDL CHOLESTEROL (CALC): 133 mg/dL — AB (ref <130)
TRIGLYCERIDES: 227 mg/dL — AB (ref <150)
Total CHOL/HDL Ratio: 4.2 (calc) (ref <5.0)

## 2018-07-14 MED ORDER — CITALOPRAM HYDROBROMIDE 40 MG PO TABS: 40.0000 mg | ORAL_TABLET | Freq: Every day | ORAL | 1 refills | Status: DC

## 2018-07-14 MED ORDER — ROSUVASTATIN CALCIUM 40 MG PO TABS: 40.0000 mg | ORAL_TABLET | Freq: Every day | ORAL | 1 refills | Status: DC

## 2018-07-14 MED ORDER — BUPROPION HCL ER (XL) 300 MG PO TB24: 300.0000 mg | ORAL_TABLET | Freq: Every day | ORAL | 1 refills | Status: DC

## 2018-07-14 MED ORDER — BUSPIRONE HCL 15 MG PO TABS: 15.0000 mg | ORAL_TABLET | Freq: Two times a day (BID) | ORAL | 1 refills | Status: DC

## 2018-07-14 MED ORDER — IBUPROFEN 600 MG PO TABS: 600.0000 mg | ORAL_TABLET | Freq: Every day | ORAL | 0 refills | Status: DC | PRN
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
## 2018-07-23 ENCOUNTER — Ambulatory Visit: Payer: Medicare PPO | Attending: Nurse Practitioner | Admitting: Nurse Practitioner

## 2018-07-23 ENCOUNTER — Encounter: Payer: Self-pay | Admitting: Nurse Practitioner

## 2018-07-23 VITALS — HR 72 | Temp 98.7°F | Resp 18 | Ht 68.0 in | Wt 235.0 lb

## 2018-07-23 DIAGNOSIS — M25561 Pain in right knee: Secondary | ICD-10-CM | POA: Diagnosis not present

## 2018-07-23 DIAGNOSIS — Z79891 Long term (current) use of opiate analgesic: Secondary | ICD-10-CM

## 2018-07-23 DIAGNOSIS — M5116 Intervertebral disc disorders with radiculopathy, lumbar region: Secondary | ICD-10-CM | POA: Diagnosis not present

## 2018-07-23 DIAGNOSIS — R7303 Prediabetes: Secondary | ICD-10-CM | POA: Insufficient documentation

## 2018-07-23 DIAGNOSIS — M4726 Other spondylosis with radiculopathy, lumbar region: Secondary | ICD-10-CM | POA: Diagnosis not present

## 2018-07-23 DIAGNOSIS — F411 Generalized anxiety disorder: Secondary | ICD-10-CM | POA: Diagnosis not present

## 2018-07-23 DIAGNOSIS — F329 Major depressive disorder, single episode, unspecified: Secondary | ICD-10-CM | POA: Diagnosis not present

## 2018-07-23 DIAGNOSIS — G894 Chronic pain syndrome: Secondary | ICD-10-CM | POA: Diagnosis not present

## 2018-07-23 DIAGNOSIS — M5416 Radiculopathy, lumbar region: Secondary | ICD-10-CM

## 2018-07-23 DIAGNOSIS — M47816 Spondylosis without myelopathy or radiculopathy, lumbar region: Secondary | ICD-10-CM

## 2018-07-23 DIAGNOSIS — M4802 Spinal stenosis, cervical region: Secondary | ICD-10-CM | POA: Insufficient documentation

## 2018-07-23 DIAGNOSIS — Z79899 Other long term (current) drug therapy: Secondary | ICD-10-CM | POA: Diagnosis not present

## 2018-07-23 DIAGNOSIS — G8929 Other chronic pain: Secondary | ICD-10-CM

## 2018-07-23 DIAGNOSIS — Z5181 Encounter for therapeutic drug level monitoring: Secondary | ICD-10-CM | POA: Diagnosis present

## 2018-07-23 DIAGNOSIS — M545 Low back pain: Secondary | ICD-10-CM | POA: Diagnosis present

## 2018-07-23 DIAGNOSIS — Z6835 Body mass index (BMI) 35.0-35.9, adult: Secondary | ICD-10-CM | POA: Insufficient documentation

## 2018-07-23 DIAGNOSIS — E785 Hyperlipidemia, unspecified: Secondary | ICD-10-CM | POA: Insufficient documentation

## 2018-07-23 MED ORDER — OXYCODONE-ACETAMINOPHEN 5-325 MG PO TABS: 1.0000 | ORAL_TABLET | ORAL | 0 refills | Status: DC | PRN

## 2018-07-23 NOTE — Progress Notes (Signed)
Patient's Name: John Garrett  MRN: [id number]  Referring Provider: Steele Sizer, MD  DOB: Feb 17, 1961  PCP: Steele Sizer, MD  DOS: 07/23/2018  Note by: Vevelyn Francois NP  Service setting: Ambulatory outpatient  Specialty: Interventional Pain Management  Location: [facility] (AMB) Pain Management Facility    Patient type: Established    Primary Reason(s) for Visit: Encounter for prescription drug management. (Level of risk: moderate)  CC: Back Pain (low)  HPI  Mr. Cragg is a 57 y.o. year old, male patient, who comes today for a medication management evaluation. He has Chronic lumbar radiculopathy (S1) (Right); Continuous opioid dependence ([place]); GAD (generalized anxiety disorder); Major depression, chronic; Morbid obesity ([place]); Dyslipidemia; Polypharmacy; Nocturnal leg cramps; Prediabetes; Chronic pain syndrome; Lumbar spondylosis; Chronic low back pain (Primary Area of Pain) (Bilateral) (R>L) w/ sciatica (Right); Lumbar facet joint syndrome (Bilateral) (R>L); Spondylosis without myelopathy or radiculopathy, lumbosacral region; Chronic lower extremity pain (Secondary Area of Pain) (Bilateral) (R>L); Chronic radicular pain of lower extremity (S1 dermatome) (Right); Chronic neck pain (Tertiary Area of Pain) (Bilateral) (R>L); Cervicogenic headache (Right); Unilateral occipital headache (Right); Cervico-occipital neuralgia (Fourth Area of Pain) (Right); Chronic shoulder pain (Fifth Area of Pain) (Left); Numbness and tingling of arm (Right); Cervical radiculitis (C7/C8) (Right); Osteoarthritis of spine with radiculopathy, lumbar region (S1) (Right); DDD (degenerative disc disease), lumbar; Lumbar facet arthropathy (Bilateral); DDD (degenerative disc disease), thoracic; Thoracic Levoscoliosis; Cervical foraminal stenosis (C3-4, C5-6) (Left); DDD (degenerative disc disease), cervical; and Cervicalgia on their problem list. His primarily concern today is the Back Pain (low)  Pain Assessment: Location: Lower  Back Radiating: denies for the most part. Occ radiation to legs but not often Onset: More than a month ago Duration: Chronic pain Quality: Aching, Constant Severity: 6 /10 (subjective, self-reported pain score)  Note: Reported level is compatible with observation.                          Effect on ADL:   Timing: Constant Modifying factors: medication, TENS, hot showers, rest and positioning BP:    HR: 72  Mr. Barro was last scheduled for an appointment on Visit date not found for medication management. During today's appointment we reviewed Mr. Shackleton chronic pain status, as well as his outpatient medication regimen.  He was greater than 1 hour late for his appointment.  This is his third time missing his scheduled appointment time.  He admits that the pain goes down the right leg. He has tingling with occasional sharp pain. He denies any weakness.  He admits that he is having right knee pain.  This has gotten worse over the last few weeks.  He admits that he has a loud pop with certain movements. He denies recent injury. He denies any swelling.    The patient  reports that he does not use drugs. His body mass index is 35.73 kg/m.  Further details on both, my assessment(s), as well as the proposed treatment plan, please see below.  Controlled Substance Pharmacotherapy Assessment REMS (Risk Evaluation and Mitigation Strategy)  Analgesic: Oxycodone/APAP 5/325 one tablet every 4 hours when necessary for pain MME/day: 45 mg/day.  Hart Rochester, RN  07/23/2018 12:23 PM  Sign at close encounter Nursing Pain Medication Assessment:  Safety precautions to be maintained throughout the outpatient stay will include: orient to surroundings, keep bed in low position, maintain call bell within reach at all times, provide assistance with transfer out of bed and ambulation.  Medication Inspection Compliance:  Pill count conducted under aseptic conditions, in front of the patient. Neither the pills  nor the bottle was removed from the patient's sight at any time. Once count was completed pills were immediately returned to the patient in their original bottle.  Medication: Oxycodone IR Pill/Patch Count: 23 of 180 pills remain Pill/Patch Appearance: Markings consistent with prescribed medication Bottle Appearance: Standard pharmacy container. Clearly labeled. Filled Date: 09 / 27 / 2019 Last Medication intake:  Today   Pharmacokinetics: Liberation and absorption (onset of action): WNL Distribution (time to peak effect): WNL Metabolism and excretion (duration of action): WNL         Pharmacodynamics: Desired effects: Analgesia: Mr. Kirkpatrick reports >50% benefit. Functional ability: Patient reports that medication allows him to accomplish basic ADLs Clinically meaningful improvement in function (CMIF): Sustained CMIF goals met Perceived effectiveness: Described as relatively effective, allowing for increase in activities of daily living (ADL) Undesirable effects: Side-effects or Adverse reactions: None reported Monitoring: [place] PMP: Online review of the past 104-monthperiod conducted. Compliant with practice rules and regulations Last UDS on record: Summary  Date Value Ref Range Status  04/22/2018 FINAL  Final    Comment:    ==================================================================== TOXASSURE SELECT 13 (MW) ==================================================================== Test                             Result       Flag       Units Drug Present and Declared for Prescription Verification   Oxycodone                      483          EXPECTED   ng/mg creat   Noroxycodone                   640          EXPECTED   ng/mg creat    Sources of oxycodone include scheduled prescription medications.    Noroxycodone is an expected metabolite of oxycodone. ==================================================================== Test                      Result    Flag   Units      Ref  Range   Creatinine              52               mg/dL      >=20 ==================================================================== Declared Medications:  The flagging and interpretation on this report are based on the  following declared medications.  Unexpected results may arise from  inaccuracies in the declared medications.  **Note: The testing scope of this panel includes these medications:  Oxycodone (Percocet)  **Note: The testing scope of this panel does not include following  reported medications:  Acetaminophen (Percocet)  Bupropion (Wellbutrin)  Buspirone (BuSpar)  Citalopram (Celexa)  Ibuprofen (Advil)  Ibuprofen (Motrin)  Rosuvastatin (Crestor)  Tizanidine (Zanaflex) ==================================================================== For clinical consultation, please call [phone] ====================================================================    UDS interpretation: Compliant          Medication Assessment Form: Reviewed. Patient indicates being compliant with therapy Treatment compliance: Compliant Risk Assessment Profile: Aberrant behavior: See prior evaluations. None observed or detected today Comorbid factors increasing risk of overdose: See prior notes. No additional risks detected today Opioid risk tool (ORT) (Total Score): 3 Personal History of Substance Abuse (SUD-Substance use disorder):  Alcohol: Negative  Illegal Drugs: Negative  Rx Drugs: Negative  ORT Risk Level calculation: Low Risk Risk of substance use disorder (SUD): Low Opioid Risk Tool - 07/23/18 1228      Family History of Substance Abuse   Alcohol  Negative    Illegal Drugs  Negative    Rx Drugs  Negative      Personal History of Substance Abuse   Alcohol  Negative    Illegal Drugs  Negative    Rx Drugs  Negative      Age   Age between 53-45 years   No      History of Preadolescent Sexual Abuse   History of Preadolescent Sexual Abuse  Negative or Male       Psychological Disease   Psychological Disease  Positive   anxiety   Depression  Positive      Total Score   Opioid Risk Tool Scoring  3    Opioid Risk Interpretation  Low Risk      ORT Scoring interpretation table:  Score <3 = Low Risk for SUD  Score between 4-7 = Moderate Risk for SUD  Score >8 = High Risk for Opioid Abuse   Risk Mitigation Strategies:  Patient Counseling: Covered Patient-Prescriber Agreement (PPA): Present and active  Notification to other healthcare providers: Done  Pharmacologic Plan: No change in therapy, at this time.             Laboratory Chemistry  Inflammation Markers (CRP: Acute Phase) (ESR: Chronic Phase) No results found for: CRP, ESRSEDRATE, LATICACIDVEN                       Rheumatology Markers No results found for: RF, ANA, LABURIC, URICUR, LYMEIGGIGMAB, LYMEABIGMQN, HLAB27                      Renal Function Markers Lab Results  Component Value Date   BUN 10 07/14/2018   CREATININE 0.87 81/10/7508   BCR NOT APPLICABLE 25/85/2778   GFRAA 112 07/14/2018   GFRNONAA 96 07/14/2018                             Hepatic Function Markers Lab Results  Component Value Date   AST 19 07/14/2018   ALT 23 07/14/2018                        Electrolytes Lab Results  Component Value Date   NA 139 07/14/2018   K 4.4 07/14/2018   CL 105 07/14/2018   CALCIUM 9.5 07/14/2018                        Neuropathy Markers Lab Results  Component Value Date   HGBA1C 5.8 (H) 01/07/2018   HIV NON-REACTIVE 01/07/2018                        CNS Tests No results found for: COLORCSF, APPEARCSF, RBCCOUNTCSF, WBCCSF, POLYSCSF, LYMPHSCSF, EOSCSF, PROTEINCSF, GLUCCSF, JCVIRUS, CSFOLI, IGGCSF                      Bone Pathology Markers No results found for: [place], [id number], [id number], PP5093OI7, 25OHVITD1, 25OHVITD2, 25OHVITD3, TESTOFREE, TESTOSTERONE                       Coagulation Parameters Lab Results  Component  Value Date   PLT 284 01/07/2018                         Cardiovascular Markers Lab Results  Component Value Date   HGB 16.3 01/07/2018   HCT 47.1 01/07/2018                         CA Markers No results found for: CEA, CA125, LABCA2                      Note: Lab results reviewed.  Recent Diagnostic Imaging Results  DG C-Arm 1-60 Min-No Report Fluoroscopy was utilized by the requesting physician.  No radiographic  interpretation.   Complexity Note: Imaging results reviewed. Results shared with Mr. Martis, using Layman's terms.                         Meds   Current Outpatient Medications:  .  buPROPion (WELLBUTRIN XL) 300 MG 24 hr tablet, Take 1 tablet (300 mg total) by mouth daily., Disp: 90 tablet, Rfl: 1 .  busPIRone (BUSPAR) 15 MG tablet, Take 1 tablet (15 mg total) by mouth 2 (two) times daily., Disp: 180 tablet, Rfl: 1 .  citalopram (CELEXA) 40 MG tablet, Take 1 tablet (40 mg total) by mouth daily., Disp: 90 tablet, Rfl: 1 .  ibuprofen (ADVIL,MOTRIN) 600 MG tablet, Take 1 tablet (600 mg total) by mouth daily as needed., Disp: 90 tablet, Rfl: 0 .  rosuvastatin (CRESTOR) 40 MG tablet, Take 1 tablet (40 mg total) by mouth daily., Disp: 90 tablet, Rfl: 1 .  tiZANidine (ZANAFLEX) 2 MG tablet, Take 1 mg by mouth 1 day or 1 dose. , Disp: , Rfl:  .  [START ON 09/25/2018] oxyCODONE-acetaminophen (PERCOCET) 5-325 MG tablet, Take 1 tablet by mouth every 4 (four) hours as needed for severe pain., Disp: 180 tablet, Rfl: 0 .  [START ON 08/26/2018] oxyCODONE-acetaminophen (PERCOCET) 5-325 MG tablet, Take 1 tablet by mouth every 4 (four) hours as needed for severe pain., Disp: 180 tablet, Rfl: 0 .  [START ON 07/27/2018] oxyCODONE-acetaminophen (PERCOCET) 5-325 MG tablet, Take 1 tablet by mouth every 4 (four) hours as needed for severe pain., Disp: 180 tablet, Rfl: 0  ROS  Constitutional: Denies any fever or chills Gastrointestinal: No reported hemesis, hematochezia, vomiting, or acute GI distress Musculoskeletal: Denies  any acute onset joint swelling, redness, loss of ROM, or weakness Neurological: No reported episodes of acute onset apraxia, aphasia, dysarthria, agnosia, amnesia, paralysis, loss of coordination, or loss of consciousness  Allergies  Mr. Insley has No Known Allergies.  [place]  Drug: Mr. Batson  reports that he does not use drugs. Alcohol:  reports that he drinks about 12.0 standard drinks of alcohol per week. Tobacco:  reports that he quit smoking about 5 years ago. His smoking use included cigarettes. He has a 20.00 pack-year smoking history. He quit smokeless tobacco use about 18 years ago.  His smokeless tobacco use included chew and snuff. Medical:  has a past medical history of Anxiety, Chronic back pain, Chronic pain, Depression, Hyperlipidemia, and Prediabetes (01/08/2018). Surgical: Mr. Pepitone  has a past surgical history that includes Cataract extraction (Right, 2014). Family: Family history is unknown by patient.  Constitutional Exam  General appearance: Well nourished, well developed, and well hydrated. In no apparent acute distress Vitals:   07/23/18 1223  Pulse: 72  Resp: 18  Temp: 98.7 F (37.1 C)  TempSrc: Oral  SpO2: 99%  Weight: 235 lb (106.6 kg)  Height: 5' 8"  (1.727 m)  Psych/Mental status: Alert, oriented x 3 (person, place, & time)       Eyes: PERLA Respiratory: No evidence of acute respiratory distress   Lumbar Spine Area Exam  Skin & Axial Inspection: No masses, redness, or swelling Alignment: Symmetrical Functional ROM: Unrestricted ROM       Stability: No instability detected Muscle Tone/Strength: Functionally intact. No obvious neuro-muscular anomalies detected. Sensory (Neurological): Unimpaired Palpation: Tender        Gait & Posture Assessment  Ambulation: Unassisted Gait: Antalgic gait (limping) Posture: Antalgic   Lower Extremity Exam    Side: Right lower extremity  Side: Left lower extremity  Stability: No instability observed           Stability: No instability observed          Skin & Extremity Inspection: Skin color, temperature, and hair growth are WNL. No peripheral edema or cyanosis. No masses, redness, swelling, asymmetry, or associated skin lesions. No contractures.  Skin & Extremity Inspection: Skin color, temperature, and hair growth are WNL. No peripheral edema or cyanosis. No masses, redness, swelling, asymmetry, or associated skin lesions. No contractures.  Functional ROM: Adequate ROM                  Functional ROM: Adequate ROM                  Muscle Tone/Strength: Normal strength (5/5)  Muscle Tone/Strength: Functionally intact. No obvious neuro-muscular anomalies detected.  Sensory (Neurological): Unimpaired  Sensory (Neurological): Unimpaired  Palpation: Complains of area being tender to palpation  Palpation: No palpable anomalies   Assessment  Primary Diagnosis & Pertinent Problem List: The primary encounter diagnosis was Chronic pain of right knee. Diagnoses of Lumbar spondylosis, Chronic lumbar radiculopathy (S1) (Right), Chronic pain syndrome, and Long term prescription opiate use were also pertinent to this visit.  Status Diagnosis  Worsening Persistent Persistent 1. Chronic pain of right knee   2. Lumbar spondylosis   3. Chronic lumbar radiculopathy (S1) (Right)   4. Chronic pain syndrome   5. Long term prescription opiate use     Problems updated and reviewed during this visit: No problems updated. Plan of Care  Pharmacotherapy (Medications Ordered): Meds ordered this encounter  Medications  . oxyCODONE-acetaminophen (PERCOCET) 5-325 MG tablet    Sig: Take 1 tablet by mouth every 4 (four) hours as needed for severe pain.    Dispense:  180 tablet    Refill:  0    Do not add this medication to the electronic "Automatic Refill" notification system. Patient may have prescription filled one day early if pharmacy is closed on scheduled refill date.    Order Specific Question:   Supervising  Provider    Answer:   Milinda Pointer [phone]  . oxyCODONE-acetaminophen (PERCOCET) 5-325 MG tablet    Sig: Take 1 tablet by mouth every 4 (four) hours as needed for severe pain.    Dispense:  180 tablet    Refill:  0    Do not place this medication, or any other prescription from our practice, on "Automatic Refill". Patient may have prescription filled one day early if pharmacy is closed on scheduled refill date.    Order Specific Question:   Supervising Provider    Answer:   Milinda Pointer [phone]  . oxyCODONE-acetaminophen (PERCOCET) 5-325 MG tablet    Sig: Take  1 tablet by mouth every 4 (four) hours as needed for severe pain.    Dispense:  180 tablet    Refill:  0    Do not place this medication, or any other prescription from our practice, on "Automatic Refill". Patient may have prescription filled one day early if pharmacy is closed on scheduled refill date.    Order Specific Question:   Supervising Provider    Answer:   Milinda Pointer [phone]   New Prescriptions   OXYCODONE-ACETAMINOPHEN (PERCOCET) 5-325 MG TABLET    Take 1 tablet by mouth every 4 (four) hours as needed for severe pain.   OXYCODONE-ACETAMINOPHEN (PERCOCET) 5-325 MG TABLET    Take 1 tablet by mouth every 4 (four) hours as needed for severe pain.   Medications administered today: Buren Kos had no medications administered during this visit. Lab-work, procedure(s), and/or referral(s): Orders Placed This Encounter  Procedures  . DG Knee 1-2 Views Right  . ToxASSURE Select 13 (MW), Urine   Imaging and/or referral(s): DG KNEE 1-2 VIEWS RIGHT  Interventional management options: Planned, scheduled, and/or pending:   Not at this time. .   Considering:   Diagnostic bilateral lumbar facet block  Possible bilateral lumbar facet RFA  Diagnostic bilateral sacroiliac joint block  Possible bilateral sacroiliac joint RFA  Diagnostic right-sided L5-S1 interlaminar LESI  Diagnostic right-sided S1  selective nerve root block/transforaminal epidural steroid injection  Diagnostic bilateral cervical facet block  Possible bilateral cervical facet RFA  Diagnostic right-sided cervical epidural steroid injection  Diagnostic left sided intra-articular shoulder joint injection  Diagnostic left sided suprascapular nerve block  Possible left sided suprascapular nerve RFA    Palliative PRN treatment(s):   None at this time   Provider-requested follow-up: Return in about 3 months (around 10/23/2018) for MedMgmt.  Future Appointments  Date Time Provider [place]  10/20/2018 10:30 AM Vevelyn Francois, NP [facility] None  01/19/2019 10:20 AM Steele Sizer, MD [facility]  04/09/2019 10:40 AM Twain ADVISOR [place]   Primary Care Physician: Steele Sizer, MD Location: [facility] Note by: Vevelyn Francois NP Date: 07/23/2018; Time: 3:54 PM  Pain Score Disclaimer: We use the NRS-11 scale. This is a self-reported, subjective measurement of pain severity with only modest accuracy. It is used primarily to identify changes within a particular patient. It must be understood that outpatient pain scales are significantly less accurate that those used for research, where they can be applied under ideal controlled circumstances with minimal exposure to variables. In reality, the score is likely to be a combination of pain intensity and pain affect, where pain affect describes the degree of emotional arousal or changes in action readiness caused by the sensory experience of pain. Factors such as social and work situation, setting, emotional state, anxiety levels, expectation, and prior pain experience may influence pain perception and show large inter-individual differences that may also be affected by time variables.  Patient instructions provided during this appointment: Patient Instructions    ____________________________________________________________________________________________  Medication Rules  Applies to: All patients receiving prescriptions (written or electronic).  Pharmacy of record: Pharmacy where electronic prescriptions will be sent. If written prescriptions are taken to a different pharmacy, please inform the nursing staff. The pharmacy listed in the electronic medical record should be the one where you would like electronic prescriptions to be sent.  Prescription refills: Only during scheduled appointments. Applies to both, written and electronic prescriptions.  NOTE: The following applies primarily to controlled substances (  Opioid* Pain Medications).   Patient's responsibilities: 1. Pain Pills: Bring all pain pills to every appointment (except for procedure appointments). 2. Pill Bottles: Bring pills in original pharmacy bottle. Always bring newest bottle. Bring bottle, even if empty. 3. Medication refills: You are responsible for knowing and keeping track of what medications you need refilled. The day before your appointment, write a list of all prescriptions that need to be refilled. Bring that list to your appointment and give it to the admitting nurse. Prescriptions will be written only during appointments. If you forget a medication, it will not be "Called in", "Faxed", or "electronically sent". You will need to get another appointment to get these prescribed. 4. Prescription Accuracy: You are responsible for carefully inspecting your prescriptions before leaving our office. Have the discharge nurse carefully go over each prescription with you, before taking them home. Make sure that your name is accurately spelled, that your address is correct. Check the name and dose of your medication to make sure it is accurate. Check the number of pills, and the written instructions to make sure they are clear and accurate. Make sure that you are given enough medication to  last until your next medication refill appointment. 5. Taking Medication: Take medication as prescribed. Never take more pills than instructed. Never take medication more frequently than prescribed. Taking less pills or less frequently is permitted and encouraged, when it comes to controlled substances (written prescriptions).  6. Inform other Doctors: Always inform, all of your healthcare providers, of all the medications you take. 7. Pain Medication from other Providers: You are not allowed to accept any additional pain medication from any other Doctor or Healthcare provider. There are two exceptions to this rule. (see below) In the event that you require additional pain medication, you are responsible for notifying us, as stated below. 8. Medication Agreement: You are responsible for carefully reading and following our Medication Agreement. This must be signed before receiving any prescriptions from our practice. Safely store a copy of your signed Agreement. Violations to the Agreement will result in no further prescriptions. (Additional copies of our Medication Agreement are available upon request.) 9. Laws, Rules, & Regulations: All patients are expected to follow all Federal and [employer], [employer], Rules, [employer]. Ignorance of the Laws does not constitute a valid excuse. The use of any illegal substances is prohibited. 10. Adopted CDC guidelines & recommendations: Target dosing levels will be at or below 60 MME/day. Use of benzodiazepines** is not recommended.  Exceptions: There are only two exceptions to the rule of not receiving pain medications from other Healthcare Providers. 1. Exception #1 (Emergencies): In the event of an emergency (i.e.: accident requiring emergency care), you are allowed to receive additional pain medication. However, you are responsible for: As soon as you are able, call our office [phone], at any time of the day or night, and leave a message stating your  name, the date and nature of the emergency, and the name and dose of the medication prescribed. In the event that your call is answered by a member of our staff, make sure to document and save the date, time, and the name of the person that took your information.  2. Exception #2 (Planned Surgery): In the event that you are scheduled by another doctor or dentist to have any type of surgery or procedure, you are allowed (for a period no longer than 30 days), to receive additional pain medication, for the acute post-op pain. However, in  this case, you are responsible for picking up a copy of our "Post-op Pain Management for Surgeons" handout, and giving it to your surgeon or dentist. This document is available at our office, and does not require an appointment to obtain it. Simply go to our office during business hours (Monday-Thursday from 8:00 AM to 4:00 PM) (Friday 8:00 AM to 12:00 Noon) or if you have a scheduled appointment with [place], prior to your surgery, and ask for it by name. In addition, you will need to provide [place] with your name, name of your surgeon, type of surgery, and date of procedure or surgery.  *Opioid medications include: morphine, codeine, oxycodone, oxymorphone, hydrocodone, hydromorphone, meperidine, tramadol, tapentadol, buprenorphine, fentanyl, methadone. **Benzodiazepine medications include: diazepam (Valium), alprazolam (Xanax), clonazepam (Klonopine), lorazepam (Ativan), clorazepate (Tranxene), chlordiazepoxide (Librium), estazolam (Prosom), oxazepam (Serax), temazepam (Restoril), triazolam (Halcion) (Last updated: 11/28/2017) ____________________________________________________________________________________________   BMI Assessment: Estimated body mass index is 35.73 kg/m as calculated from the following:   Height as of this encounter: 5' 8"  (1.727 m).   Weight as of this encounter: 235 lb (106.6 kg).  BMI interpretation table: BMI level Category Range association with  higher incidence of chronic pain  <18 kg/m2 Underweight   18.5-24.9 kg/m2 Ideal body weight   25-29.9 kg/m2 Overweight Increased incidence by 20%  30-34.9 kg/m2 Obese (Class I) Increased incidence by 68%  35-39.9 kg/m2 Severe obesity (Class II) Increased incidence by 136%  >40 kg/m2 Extreme obesity (Class III) Increased incidence by 254%   Patient's current BMI Ideal Body weight  Body mass index is 35.73 kg/m. Ideal body weight: 68.4 kg (150 lb 12.7 oz) Adjusted ideal body weight: 83.7 kg (184 lb 7.6 oz)   BMI Readings from Last 4 Encounters:  07/23/18 35.73 kg/m  07/14/18 35.84 kg/m  04/22/18 38.01 kg/m  04/11/18 38.10 kg/m   Wt Readings from Last 4 Encounters:  07/23/18 235 lb (106.6 kg)  07/14/18 235 lb 11.2 oz (106.9 kg)  04/22/18 250 lb (113.4 kg)  04/11/18 250 lb 9.6 oz (113.7 kg)   Oxycodone - apap 5-325 mg x 3 months to begin filling on 07/27/18 escribed to your pharmacy   Xray of knee ordered

## 2018-07-23 NOTE — Patient Instructions (Addendum)
____________________________________________________________________________________________  Medication Rules  Applies to: All patients receiving prescriptions (written or electronic).  Pharmacy of record: Pharmacy where electronic prescriptions will be sent. If written prescriptions are taken to a different pharmacy, please inform the nursing staff. The pharmacy listed in the electronic medical record should be the one where you would like electronic prescriptions to be sent.  Prescription refills: Only during scheduled appointments. Applies to both, written and electronic prescriptions.  NOTE: The following applies primarily to controlled substances (Opioid* Pain Medications).   Patient's responsibilities: 1. Pain Pills: Bring all pain pills to every appointment (except for procedure appointments). 2. Pill Bottles: Bring pills in original pharmacy bottle. Always bring newest bottle. Bring bottle, even if empty. 3. Medication refills: You are responsible for knowing and keeping track of what medications you need refilled. The day before your appointment, write a list of all prescriptions that need to be refilled. Bring that list to your appointment and give it to the admitting nurse. Prescriptions will be written only during appointments. If you forget a medication, it will not be "Called in", "Faxed", or "electronically sent". You will need to get another appointment to get these prescribed. 4. Prescription Accuracy: You are responsible for carefully inspecting your prescriptions before leaving our office. Have the discharge nurse carefully go over each prescription with you, before taking them home. Make sure that your name is accurately spelled, that your address is correct. Check the name and dose of your medication to make sure it is accurate. Check the number of pills, and the written instructions to make sure they are clear and accurate. Make sure that you are given enough medication to last  until your next medication refill appointment. 5. Taking Medication: Take medication as prescribed. Never take more pills than instructed. Never take medication more frequently than prescribed. Taking less pills or less frequently is permitted and encouraged, when it comes to controlled substances (written prescriptions).  6. Inform other Doctors: Always inform, all of your healthcare providers, of all the medications you take. 7. Pain Medication from other Providers: You are not allowed to accept any additional pain medication from any other Doctor or Healthcare provider. There are two exceptions to this rule. (see below) In the event that you require additional pain medication, you are responsible for notifying us, as stated below. 8. Medication Agreement: You are responsible for carefully reading and following our Medication Agreement. This must be signed before receiving any prescriptions from our practice. Safely store a copy of your signed Agreement. Violations to the Agreement will result in no further prescriptions. (Additional copies of our Medication Agreement are available upon request.) 9. Laws, Rules, & Regulations: All patients are expected to follow all Federal and State Laws, Statutes, Rules, & Regulations. Ignorance of the Laws does not constitute a valid excuse. The use of any illegal substances is prohibited. 10. Adopted CDC guidelines & recommendations: Target dosing levels will be at or below 60 MME/day. Use of benzodiazepines** is not recommended.  Exceptions: There are only two exceptions to the rule of not receiving pain medications from other Healthcare Providers. 1. Exception #1 (Emergencies): In the event of an emergency (i.e.: accident requiring emergency care), you are allowed to receive additional pain medication. However, you are responsible for: As soon as you are able, call our office [phone], at any time of the day or night, and leave a message stating your name, the  date and nature of the emergency, and the name and dose of the medication   prescribed. In the event that your call is answered by a member of our staff, make sure to document and save the date, time, and the name of the person that took your information.  2. Exception #2 (Planned Surgery): In the event that you are scheduled by another doctor or dentist to have any type of surgery or procedure, you are allowed (for a period no longer than 30 days), to receive additional pain medication, for the acute post-op pain. However, in this case, you are responsible for picking up a copy of our "Post-op Pain Management for Surgeons" handout, and giving it to your surgeon or dentist. This document is available at our office, and does not require an appointment to obtain it. Simply go to our office during business hours (Monday-Thursday from 8:00 AM to 4:00 PM) (Friday 8:00 AM to 12:00 Noon) or if you have a scheduled appointment with [place], prior to your surgery, and ask for it by name. In addition, you will need to provide [place] with your name, name of your surgeon, type of surgery, and date of procedure or surgery.  *Opioid medications include: morphine, codeine, oxycodone, oxymorphone, hydrocodone, hydromorphone, meperidine, tramadol, tapentadol, buprenorphine, fentanyl, methadone. **Benzodiazepine medications include: diazepam (Valium), alprazolam (Xanax), clonazepam (Klonopine), lorazepam (Ativan), clorazepate (Tranxene), chlordiazepoxide (Librium), estazolam (Prosom), oxazepam (Serax), temazepam (Restoril), triazolam (Halcion) (Last updated: 11/28/2017) ____________________________________________________________________________________________   BMI Assessment: Estimated body mass index is 35.73 kg/m as calculated from the following:   Height as of this encounter: 5\' 8"  (1.727 m).   Weight as of this encounter: 235 lb (106.6 kg).  BMI interpretation table: BMI level Category Range association with higher  incidence of chronic pain  <18 kg/m2 Underweight   18.5-24.9 kg/m2 Ideal body weight   25-29.9 kg/m2 Overweight Increased incidence by 20%  30-34.9 kg/m2 Obese (Class I) Increased incidence by 68%  35-39.9 kg/m2 Severe obesity (Class II) Increased incidence by 136%  >40 kg/m2 Extreme obesity (Class III) Increased incidence by 254%   Patient's current BMI Ideal Body weight  Body mass index is 35.73 kg/m. Ideal body weight: 68.4 kg (150 lb 12.7 oz) Adjusted ideal body weight: 83.7 kg (184 lb 7.6 oz)   BMI Readings from Last 4 Encounters:  07/23/18 35.73 kg/m  07/14/18 35.84 kg/m  04/22/18 38.01 kg/m  04/11/18 38.10 kg/m   Wt Readings from Last 4 Encounters:  07/23/18 235 lb (106.6 kg)  07/14/18 235 lb 11.2 oz (106.9 kg)  04/22/18 250 lb (113.4 kg)  04/11/18 250 lb 9.6 oz (113.7 kg)   Oxycodone - apap 5-325 mg x 3 months to begin filling on 07/27/18 escribed to your pharmacy   Xray of knee ordered

## 2018-07-23 NOTE — Progress Notes (Signed)
Nursing Pain Medication Assessment:  Safety precautions to be maintained throughout the outpatient stay will include: orient to surroundings, keep bed in low position, maintain call bell within reach at all times, provide assistance with transfer out of bed and ambulation.  Medication Inspection Compliance: Pill count conducted under aseptic conditions, in front of the patient. Neither the pills nor the bottle was removed from the patient's sight at any time. Once count was completed pills were immediately returned to the patient in their original bottle.  Medication: Oxycodone IR Pill/Patch Count: 23 of 180 pills remain Pill/Patch Appearance: Markings consistent with prescribed medication Bottle Appearance: Standard pharmacy container. Clearly labeled. Filled Date: 09 / 27 / 2019 Last Medication intake:  Today

## 2018-07-27 LAB — TOXASSURE SELECT 13 (MW), URINE

## 2018-10-09 ENCOUNTER — Other Ambulatory Visit: Payer: Self-pay | Admitting: Family Medicine

## 2018-10-09 DIAGNOSIS — M5416 Radiculopathy, lumbar region: Secondary | ICD-10-CM

## 2018-10-09 NOTE — Telephone Encounter (Signed)
Refill request for general medication: Ibuprofen 600 mg  Last office visit: 07/14/2018  Last physical exam: 04/01/2018  Follow-ups on file. 01/19/2019

## 2018-10-20 ENCOUNTER — Ambulatory Visit: Payer: Medicare PPO | Admitting: Nurse Practitioner

## 2018-10-22 ENCOUNTER — Encounter: Payer: Self-pay | Admitting: Nurse Practitioner

## 2018-10-22 ENCOUNTER — Ambulatory Visit: Payer: Medicare PPO | Attending: Nurse Practitioner | Admitting: Nurse Practitioner

## 2018-10-22 VITALS — BP 120/67 | HR 75 | Temp 98.0°F | Resp 16 | Ht 67.0 in | Wt 235.0 lb

## 2018-10-22 DIAGNOSIS — M47816 Spondylosis without myelopathy or radiculopathy, lumbar region: Secondary | ICD-10-CM | POA: Diagnosis not present

## 2018-10-22 DIAGNOSIS — M5481 Occipital neuralgia: Secondary | ICD-10-CM

## 2018-10-22 DIAGNOSIS — M5412 Radiculopathy, cervical region: Secondary | ICD-10-CM | POA: Insufficient documentation

## 2018-10-22 DIAGNOSIS — G894 Chronic pain syndrome: Secondary | ICD-10-CM | POA: Insufficient documentation

## 2018-10-22 MED ORDER — OXYCODONE-ACETAMINOPHEN 5-325 MG PO TABS: 1.0000 | ORAL_TABLET | ORAL | 0 refills | Status: DC | PRN

## 2018-10-22 NOTE — Progress Notes (Signed)
Nursing Pain Medication Assessment:  Safety precautions to be maintained throughout the outpatient stay will include: orient to surroundings, keep bed in low position, maintain call bell within reach at all times, provide assistance with transfer out of bed and ambulation.  Medication Inspection Compliance: Pill count conducted under aseptic conditions, in front of the patient. Neither the pills nor the bottle was removed from the patient's sight at any time. Once count was completed pills were immediately returned to the patient in their original bottle.  Medication: Oxycodone/APAP Pill/Patch Count: 32 of 180 pills remain Pill/Patch Appearance: Markings consistent with prescribed medication Bottle Appearance: Standard pharmacy container. Clearly labeled. Filled Date: 31 / 27 / 2019 Last Medication intake:  Today

## 2018-10-22 NOTE — Progress Notes (Signed)
Patient's Name: John Garrett  MRN: [id number]  Referring Provider: Steele Sizer, MD  DOB: 1961-07-12  PCP: Steele Sizer, MD  DOS: 10/22/2018  Note by: Vevelyn Francois NP  Service setting: Ambulatory outpatient  Specialty: Interventional Pain Management  Location: [facility] (AMB) Pain Management Facility    Patient type: Established    Primary Reason(s) for Visit: Encounter for prescription drug management. (Level of risk: moderate)  CC: Back Pain (lower more on the right ); Tailbone Pain; and Neck Pain (right is worse )  HPI  John Garrett is a 58 y.o. year old, male patient, who comes today for a medication management evaluation. He has Chronic lumbar radiculopathy (S1) (Right); Continuous opioid dependence ([place]); GAD (generalized anxiety disorder); Major depression, chronic; Morbid obesity ([place]); Dyslipidemia; Polypharmacy; Nocturnal leg cramps; Prediabetes; Chronic pain syndrome; Lumbar spondylosis; Chronic low back pain (Primary Area of Pain) (Bilateral) (R>L) w/ sciatica (Right); Lumbar facet joint syndrome (Bilateral) (R>L); Spondylosis without myelopathy or radiculopathy, lumbosacral region; Chronic lower extremity pain (Secondary Area of Pain) (Bilateral) (R>L); Chronic radicular pain of lower extremity (S1 dermatome) (Right); Chronic neck pain (Tertiary Area of Pain) (Bilateral) (R>L); Cervicogenic headache (Right); Unilateral occipital headache (Right); Cervico-occipital neuralgia (Fourth Area of Pain) (Right); Chronic shoulder pain (Fifth Area of Pain) (Left); Numbness and tingling of arm (Right); Cervical radiculitis (C7/C8) (Right); Osteoarthritis of spine with radiculopathy, lumbar region (S1) (Right); DDD (degenerative disc disease), lumbar; Lumbar facet arthropathy (Bilateral); DDD (degenerative disc disease), thoracic; Thoracic Levoscoliosis; Cervical foraminal stenosis (C3-4, C5-6) (Left); DDD (degenerative disc disease), cervical; and Cervicalgia on their problem list. His primarily concern  today is the Back Pain (lower more on the right ); Tailbone Pain; and Neck Pain (right is worse )  Pain Assessment: Location: Lower, Right Back(neck, tailbone) Radiating: into right leg and neck pain into right arm and hand. Onset: More than a month ago Duration: Chronic pain Quality: Discomfort, Tingling, Aching, Throbbing, Constant Severity: 6 /10 (subjective, self-reported pain score)  Note: Reported level is compatible with observation. Clinically the patient looks like a 2/10 A 2/10 is viewed as "Mild to Moderate" and described as noticeable and distracting. Impossible to hide from other people. More frequent flare-ups. Still possible to adapt and function close to normal. It can be very annoying and may have occasional stronger flare-ups. With discipline, patients may get used to it and adapt. Information on the proper use of the pain scale provided to the patient today. When using our objective Pain Scale, levels between 6 and 10/10 are said to belong in an emergency room, as it progressively worsens from a 6/10, described as severely limiting, requiring emergency care not usually available at an outpatient pain management facility. At a 6/10 level, communication becomes difficult and requires great effort. Assistance to reach the emergency department may be required. Facial flushing and profuse sweating along with potentially dangerous increases in heart rate and blood pressure will be evident. Effect on ADL: some days are good, some days are bad,  takes time for activities like housework  Timing: Constant Modifying factors: medications, pillows for positioning, hot showers and TENS unit  BP: 120/67  HR: 75  John Garrett was last scheduled for an appointment on 07/23/2018 for medication management. During today's appointment we reviewed John Garrett chronic pain status, as well as his outpatient medication regimen. He denies any chagnes in his pain. He denies any concerns today.   The patient   reports no history of drug use. His body mass index is 36.81 kg/m.  Further details on both, my assessment(s), as well as the proposed treatment plan, please see below.  Controlled Substance Pharmacotherapy Assessment REMS (Risk Evaluation and Mitigation Strategy)  Analgesic:Oxycodone/APAP 5/325 one tablet every 4 hours when necessary for pain MME/day:58m/day.  PJanett Billow RN  10/22/2018 11:58 AM  Sign when Signing Visit Nursing Pain Medication Assessment:  Safety precautions to be maintained throughout the outpatient stay will include: orient to surroundings, keep bed in low position, maintain call bell within reach at all times, provide assistance with transfer out of bed and ambulation.  Medication Inspection Compliance: Pill count conducted under aseptic conditions, in front of the patient. Neither the pills nor the bottle was removed from the patient's sight at any time. Once count was completed pills were immediately returned to the patient in their original bottle.  Medication: Oxycodone/APAP Pill/Patch Count: 32 of 180 pills remain Pill/Patch Appearance: Markings consistent with prescribed medication Bottle Appearance: Standard pharmacy container. Clearly labeled. Filled Date: 151/ 27 / 2019 Last Medication intake:  Today   Pharmacokinetics: Liberation and absorption (onset of action): WNL Distribution (time to peak effect): WNL Metabolism and excretion (duration of action): WNL         Pharmacodynamics: Desired effects: Analgesia: Mr. MHellingerreports >50% benefit. Functional ability: Patient reports that medication allows him to accomplish basic ADLs Clinically meaningful improvement in function (CMIF): Sustained CMIF goals met Perceived effectiveness: Described as relatively effective, allowing for increase in activities of daily living (ADL) Undesirable effects: Side-effects or Adverse reactions: None reported Monitoring: [place] PMP: Online review of the past  124-montheriod conducted. Compliant with practice rules and regulations Last UDS on record: Summary  Date Value Ref Range Status  07/23/2018 FINAL  Final    Comment:    ==================================================================== TOXASSURE SELECT 13 (MW) ==================================================================== Test                             Result       Flag       Units Drug Present and Declared for Prescription Verification   Oxycodone                      1769         EXPECTED   ng/mg creat   Oxymorphone                    78           EXPECTED   ng/mg creat   Noroxycodone                   1707         EXPECTED   ng/mg creat    Sources of oxycodone include scheduled prescription medications.    Oxymorphone and noroxycodone are expected metabolites of    oxycodone. Oxymorphone is also available as a scheduled    prescription medication. ==================================================================== Test                      Result    Flag   Units      Ref Range   Creatinine              142              mg/dL      >=20 ==================================================================== Declared Medications:  The flagging and interpretation on this report are based on the  following declared medications.  Unexpected results may arise from  inaccuracies in the declared medications.  **Note: The testing scope of this panel includes these medications:  Oxycodone (Oxycodone Acetaminophen)  **Note: The testing scope of this panel does not include following  reported medications:  Acetaminophen (Oxycodone Acetaminophen)  Bupropion  Buspirone  Citalopram  Ibuprofen  Rosuvastatin  Tizanidine ==================================================================== For clinical consultation, please call [phone]. ====================================================================    UDS interpretation: Compliant          Medication Assessment Form:  Reviewed. Patient indicates being compliant with therapy Treatment compliance: Compliant Risk Assessment Profile: Aberrant behavior: See prior evaluations. None observed or detected today Comorbid factors increasing risk of overdose: See prior notes. No additional risks detected today Opioid risk tool (ORT) (Total Score): 3 Personal History of Substance Abuse (SUD-Substance use disorder):  Alcohol: Negative  Illegal Drugs: Negative  Rx Drugs: Negative  ORT Risk Level calculation: Low Risk Risk of substance use disorder (SUD): Low Opioid Risk Tool - 10/22/18 1149      Family History of Substance Abuse   Alcohol  Negative   patient from foster home but doesn;t think so   Illegal Drugs  Negative    Rx Drugs  Negative      Personal History of Substance Abuse   Alcohol  Negative    Illegal Drugs  Negative    Rx Drugs  Negative      Age   Age between 20-45 years   No      Psychological Disease   Psychological Disease  Positive    ADD  Negative    OCD  Negative    Bipolar  Negative    Schizophrenia  Negative    Depression  Positive      Total Score   Opioid Risk Tool Scoring  3    Opioid Risk Interpretation  Low Risk      ORT Scoring interpretation table:  Score <3 = Low Risk for SUD  Score between 4-7 = Moderate Risk for SUD  Score >8 = High Risk for Opioid Abuse   Risk Mitigation Strategies:  Patient Counseling: Covered Patient-Prescriber Agreement (PPA): Present and active  Notification to other healthcare providers: Done  Pharmacologic Plan: No change in therapy, at this time.             Laboratory Chemistry  Inflammation Markers (CRP: Acute Phase) (ESR: Chronic Phase) No results found for: CRP, ESRSEDRATE, LATICACIDVEN                       Rheumatology Markers No results found for: RF, ANA, LABURIC, URICUR, LYMEIGGIGMAB, LYMEABIGMQN, HLAB27                      Renal Function Markers Lab Results  Component Value Date   BUN 10 07/14/2018   CREATININE  0.87 27/74/1287   BCR NOT APPLICABLE 86/76/7209   GFRAA 112 07/14/2018   GFRNONAA 96 07/14/2018                             Hepatic Function Markers Lab Results  Component Value Date   AST 19 07/14/2018   ALT 23 07/14/2018                        Electrolytes Lab Results  Component Value Date   NA 139 07/14/2018   K 4.4 07/14/2018   CL 105 07/14/2018  CALCIUM 9.5 07/14/2018                        Neuropathy Markers Lab Results  Component Value Date   HGBA1C 5.8 (H) 01/07/2018   HIV NON-REACTIVE 01/07/2018                        CNS Tests No results found for: COLORCSF, APPEARCSF, RBCCOUNTCSF, WBCCSF, POLYSCSF, LYMPHSCSF, EOSCSF, PROTEINCSF, GLUCCSF, JCVIRUS, CSFOLI, IGGCSF                      Bone Pathology Markers No results found for: VD25OH, EN277OE4MPN, TI1443XV4, MG8676PP5, 25OHVITD1, 25OHVITD2, 25OHVITD3, TESTOFREE, TESTOSTERONE                       Coagulation Parameters Lab Results  Component Value Date   PLT 284 01/07/2018                        Cardiovascular Markers Lab Results  Component Value Date   HGB 16.3 01/07/2018   HCT 47.1 01/07/2018                         CA Markers No results found for: CEA, CA125, LABCA2                      Note: Lab results reviewed.  Recent Diagnostic Imaging Results  DG C-Arm 1-60 Min-No Report Fluoroscopy was utilized by the requesting physician.  No radiographic  interpretation.   Complexity Note: Imaging results reviewed. Results shared with John Garrett, using Layman's terms.                         Meds   Current Outpatient Medications:  .  buPROPion (WELLBUTRIN XL) 300 MG 24 hr tablet, Take 1 tablet (300 mg total) by mouth daily., Disp: 90 tablet, Rfl: 1 .  busPIRone (BUSPAR) 15 MG tablet, Take 1 tablet (15 mg total) by mouth 2 (two) times daily., Disp: 180 tablet, Rfl: 1 .  citalopram (CELEXA) 40 MG tablet, Take 1 tablet (40 mg total) by mouth daily., Disp: 90 tablet, Rfl: 1 .  ibuprofen  (ADVIL,MOTRIN) 600 MG tablet, TAKE 1 TABLET EVERY DAY AS NEEDED, Disp: 90 tablet, Rfl: 0 .  [START ON 12/25/2018] oxyCODONE-acetaminophen (PERCOCET) 5-325 MG tablet, Take 1 tablet by mouth every 4 (four) hours as needed for up to 30 days for severe pain., Disp: 180 tablet, Rfl: 0 .  rosuvastatin (CRESTOR) 40 MG tablet, Take 1 tablet (40 mg total) by mouth daily., Disp: 90 tablet, Rfl: 1 .  tiZANidine (ZANAFLEX) 2 MG tablet, Take 1 mg by mouth 1 day or 1 dose. , Disp: , Rfl:  .  [START ON 11/25/2018] oxyCODONE-acetaminophen (PERCOCET) 5-325 MG tablet, Take 1 tablet by mouth every 4 (four) hours as needed for up to 30 days for severe pain., Disp: 180 tablet, Rfl: 0 .  [START ON 10/26/2018] oxyCODONE-acetaminophen (PERCOCET) 5-325 MG tablet, Take 1 tablet by mouth every 4 (four) hours as needed for up to 30 days for severe pain., Disp: 180 tablet, Rfl: 0  ROS  Constitutional: Denies any fever or chills Gastrointestinal: No reported hemesis, hematochezia, vomiting, or acute GI distress Musculoskeletal: Denies any acute onset joint swelling, redness, loss of ROM, or weakness Neurological: No reported episodes of acute onset apraxia,  aphasia, dysarthria, agnosia, amnesia, paralysis, loss of coordination, or loss of consciousness  Allergies  John Garrett has No Known Allergies.  [place]  Drug: John Garrett  reports no history of drug use. Alcohol:  reports current alcohol use of about 12.0 standard drinks of alcohol per week. Tobacco:  reports that he quit smoking about 5 years ago. His smoking use included cigarettes. He has a 20.00 pack-year smoking history. He quit smokeless tobacco use about 19 years ago.  His smokeless tobacco use included chew and snuff. Medical:  has a past medical history of Anxiety, Chronic back pain, Chronic pain, Depression, Hyperlipidemia, and Prediabetes (01/08/2018). Surgical: John Garrett  has a past surgical history that includes Cataract extraction (Right, 2014). Family: Family  history is unknown by patient.  Constitutional Exam  General appearance: Well nourished, well developed, and well hydrated. In no apparent acute distress Vitals:   10/22/18 1145  BP: 120/67  Pulse: 75  Resp: 16  Temp: 98 F (36.7 C)  TempSrc: Oral  SpO2: 100%  Weight: 235 lb (106.6 kg)  Height: 5' 7"  (1.702 m)  Psych/Mental status: Alert, oriented x 3 (person, place, & time)       Eyes: PERLA Respiratory: No evidence of acute respiratory distress  Cervical Spine Area Exam  Skin & Axial Inspection: No masses, redness, edema, swelling, or associated skin lesions Alignment: Symmetrical Functional ROM: Unrestricted ROM      Stability: No instability detected Muscle Tone/Strength: Functionally intact. No obvious neuro-muscular anomalies detected. Sensory (Neurological): Unimpaired Palpation: No palpable anomalies              Upper Extremity (UE) Exam    Side: Right upper extremity  Side: Left upper extremity  Skin & Extremity Inspection: Skin color, temperature, and hair growth are WNL. No peripheral edema or cyanosis. No masses, redness, swelling, asymmetry, or associated skin lesions. No contractures.  Skin & Extremity Inspection: Skin color, temperature, and hair growth are WNL. No peripheral edema or cyanosis. No masses, redness, swelling, asymmetry, or associated skin lesions. No contractures.  Functional ROM: Unrestricted ROM          Functional ROM: Unrestricted ROM          Muscle Tone/Strength: Functionally intact. No obvious neuro-muscular anomalies detected.  Muscle Tone/Strength: Functionally intact. No obvious neuro-muscular anomalies detected.  Sensory (Neurological): Unimpaired          Sensory (Neurological): Unimpaired          Palpation: No palpable anomalies              Palpation: No palpable anomalies                   Thoracic Spine Area Exam  Skin & Axial Inspection: No masses, redness, or swelling Alignment: Symmetrical Functional ROM: Unrestricted  ROM Stability: No instability detected Muscle Tone/Strength: Functionally intact. No obvious neuro-muscular anomalies detected. Sensory (Neurological): Unimpaired Muscle strength & Tone: No palpable anomalies  Lumbar Spine Area Exam  Skin & Axial Inspection: No masses, redness, or swelling Alignment: Symmetrical Functional ROM: Unrestricted ROM       Stability: No instability detected Muscle Tone/Strength: Functionally intact. No obvious neuro-muscular anomalies detected. Sensory (Neurological): Unimpaired Palpation: No palpable anomalies       Provocative Tests: Hyperextension/rotation test: deferred today       Lumbar quadrant test (Kemp's test): deferred today       Lateral bending test: deferred today       Patrick's Maneuver: deferred today  Gait & Posture Assessment  Ambulation: Unassisted Gait: Relatively normal for age and body habitus Posture: WNL   Lower Extremity Exam    Side: Right lower extremity  Side: Left lower extremity  Stability: No instability observed          Stability: No instability observed          Skin & Extremity Inspection: Skin color, temperature, and hair growth are WNL. No peripheral edema or cyanosis. No masses, redness, swelling, asymmetry, or associated skin lesions. No contractures.  Skin & Extremity Inspection: Skin color, temperature, and hair growth are WNL. No peripheral edema or cyanosis. No masses, redness, swelling, asymmetry, or associated skin lesions. No contractures.  Functional ROM: Unrestricted ROM                  Functional ROM: Unrestricted ROM                  Muscle Tone/Strength: Functionally intact. No obvious neuro-muscular anomalies detected.  Muscle Tone/Strength: Functionally intact. No obvious neuro-muscular anomalies detected.  Sensory (Neurological): Unimpaired        Sensory (Neurological): Unimpaired            Palpation: No palpable anomalies  Palpation: No palpable anomalies   Assessment   Primary Diagnosis & Pertinent Problem List: The primary encounter diagnosis was Lumbar spondylosis. Diagnoses of Cervico-occipital neuralgia (Fourth Area of Pain) (Right), Cervical radiculitis (C7/C8) (Right), and Chronic pain syndrome were also pertinent to this visit.  Status Diagnosis  Persistent Persistent Persistent 1. Lumbar spondylosis   2. Cervico-occipital neuralgia (Fourth Area of Pain) (Right)   3. Cervical radiculitis (C7/C8) (Right)   4. Chronic pain syndrome     Problems updated and reviewed during this visit: No problems updated. Plan of Care  Pharmacotherapy (Medications Ordered): Meds ordered this encounter  Medications  . oxyCODONE-acetaminophen (PERCOCET) 5-325 MG tablet    Sig: Take 1 tablet by mouth every 4 (four) hours as needed for up to 30 days for severe pain.    Dispense:  180 tablet    Refill:  0    Do not add this medication to the electronic "Automatic Refill" notification system. Patient may have prescription filled one day early if pharmacy is closed on scheduled refill date.    Order Specific Question:   Supervising Provider    Answer:   Milinda Pointer [phone]  . oxyCODONE-acetaminophen (PERCOCET) 5-325 MG tablet    Sig: Take 1 tablet by mouth every 4 (four) hours as needed for up to 30 days for severe pain.    Dispense:  180 tablet    Refill:  0    Do not place this medication, or any other prescription from our practice, on "Automatic Refill". Patient may have prescription filled one day early if pharmacy is closed on scheduled refill date.    Order Specific Question:   Supervising Provider    Answer:   Milinda Pointer [phone]  . oxyCODONE-acetaminophen (PERCOCET) 5-325 MG tablet    Sig: Take 1 tablet by mouth every 4 (four) hours as needed for up to 30 days for severe pain.    Dispense:  180 tablet    Refill:  0    Do not place this medication, or any other prescription from our practice, on "Automatic Refill". Patient may have  prescription filled one day early if pharmacy is closed on scheduled refill date.    Order Specific Question:   Supervising Provider    Answer:   Milinda Pointer [  [id number]]   New Prescriptions   No medications on file   Medications administered today: Buren Kos had no medications administered during this visit. Lab-work, procedure(s), and/or referral(s): No orders of the defined types were placed in this encounter.  Imaging and/or referral(s): None Interventional management options: Planned, scheduled, and/or pending:   Not at this time   Considering:   Diagnostic bilateral lumbar facet block  Possible bilateral lumbar facet RFA  Diagnostic bilateral sacroiliac joint block  Possible bilateral sacroiliac joint RFA  Diagnostic right-sided L5-S1 interlaminar LESI  Diagnostic right-sided S1 selective nerve root block/transforaminal epidural steroid injection  Diagnostic bilateral cervical facet block  Possible bilateral cervical facet RFA  Diagnostic right-sided cervical epidural steroid injection  Diagnostic left sided intra-articular shoulder joint injection  Diagnostic left sided suprascapular nerve block  Possible left sided suprascapular nerve RFA    Palliative PRN treatment(s):   None at this time    Provider-requested follow-up: Return in about 3 months (around 01/21/2019) for MedMgmt.  Future Appointments  Date Time Provider [place]  01/19/2019 10:20 AM Steele Sizer, MD [place] [facility]  01/20/2019 11:00 AM Vevelyn Francois, NP [facility] None  04/09/2019 10:40 AM [place] ADVISOR [place]   Primary Care Physician: Steele Sizer, MD Location: [facility] Note by: Vevelyn Francois NP Date: 10/22/2018; Time: 2:47 PM  Pain Score Disclaimer: We use the NRS-11 scale. This is a self-reported, subjective measurement of pain severity with only modest accuracy. It is used primarily to identify changes within a  particular patient. It must be understood that outpatient pain scales are significantly less accurate that those used for research, where they can be applied under ideal controlled circumstances with minimal exposure to variables. In reality, the score is likely to be a combination of pain intensity and pain affect, where pain affect describes the degree of emotional arousal or changes in action readiness caused by the sensory experience of pain. Factors such as social and work situation, setting, emotional state, anxiety levels, expectation, and prior pain experience may influence pain perception and show large inter-individual differences that may also be affected by time variables.  Patient instructions provided during this appointment: Patient Instructions   ____________________________________________________________________________________________  Medication Rules  Purpose: To inform patients, and their family members, of our rules and regulations.  Applies to: All patients receiving prescriptions (written or electronic).  Pharmacy of record: Pharmacy where electronic prescriptions will be sent. If written prescriptions are taken to a different pharmacy, please inform the nursing staff. The pharmacy listed in the electronic medical record should be the one where you would like electronic prescriptions to be sent.  Electronic prescriptions: In compliance with the [place] (STOP) Act of 2017 (Session Lanny Cramp [phone]), effective October 01, 2018, all controlled substances must be electronically prescribed. Calling prescriptions to the pharmacy will cease to exist.  Prescription refills: Only during scheduled appointments. Applies to all prescriptions.  NOTE: The following applies primarily to controlled substances (Opioid* Pain Medications).   Patient's responsibilities: 1. Pain Pills: Bring all pain pills to every appointment (except for procedure  appointments). 2. Pill Bottles: Bring pills in original pharmacy bottle. Always bring the newest bottle. Bring bottle, even if empty. 3. Medication refills: You are responsible for knowing and keeping track of what medications you take and those you need refilled. The day before your appointment: write a list of all prescriptions that need to be refilled. The day of the appointment: give the  list to the admitting nurse. Prescriptions will be written only during appointments. If you forget a medication: it will not be "Called in", "Faxed", or "electronically sent". You will need to get another appointment to get these prescribed. No early refills. Do not call asking to have your prescription filled early. 4. Prescription Accuracy: You are responsible for carefully inspecting your prescriptions before leaving our office. Have the discharge nurse carefully go over each prescription with you, before taking them home. Make sure that your name is accurately spelled, that your address is correct. Check the name and dose of your medication to make sure it is accurate. Check the number of pills, and the written instructions to make sure they are clear and accurate. Make sure that you are given enough medication to last until your next medication refill appointment. 5. Taking Medication: Take medication as prescribed. When it comes to controlled substances, taking less pills or less frequently than prescribed is permitted and encouraged. Never take more pills than instructed. Never take medication more frequently than prescribed.  6. Inform other Doctors: Always inform, all of your healthcare providers, of all the medications you take. 7. Pain Medication from other Providers: You are not allowed to accept any additional pain medication from any other Doctor or Healthcare provider. There are two exceptions to this rule. (see below) In the event that you require additional pain medication, you are responsible for  notifying us, as stated below. 8. Medication Agreement: You are responsible for carefully reading and following our Medication Agreement. This must be signed before receiving any prescriptions from our practice. Safely store a copy of your signed Agreement. Violations to the Agreement will result in no further prescriptions. (Additional copies of our Medication Agreement are available upon request.) 9. Laws, Rules, & Regulations: All patients are expected to follow all Federal and [employer], [employer], Rules, [employer] [facility]. Ignorance of the Laws does not constitute a valid excuse. The use of any illegal substances is prohibited. 10. Adopted CDC guidelines & recommendations: Target dosing levels will be at or below 60 MME/day. Use of benzodiazepines** is not recommended.  Exceptions: There are only two exceptions to the rule of not receiving pain medications from other Healthcare Providers. 1. Exception #1 (Emergencies): In the event of an emergency (i.e.: accident requiring emergency care), you are allowed to receive additional pain medication. However, you are responsible for: As soon as you are able, call our office [phone], at any time of the day or night, and leave a message stating your name, the date and nature of the emergency, and the name and dose of the medication prescribed. In the event that your call is answered by a member of our staff, make sure to document and save the date, time, and the name of the person that took your information.  2. Exception #2 (Planned Surgery): In the event that you are scheduled by another doctor or dentist to have any type of surgery or procedure, you are allowed (for a period no longer than 30 days), to receive additional pain medication, for the acute post-op pain. However, in this case, you are responsible for picking up a copy of our "Post-op Pain Management for Surgeons" handout, and giving it to your surgeon or dentist. This document is available at our  office, and does not require an appointment to obtain it. Simply go to our office during business hours (Monday-Thursday from 8:00 AM to 4:00 PM) (Friday 8:00 AM to 12:00 Noon) or if you have  a scheduled appointment with [place], prior to your surgery, and ask for it by name. In addition, you will need to provide [place] with your name, name of your surgeon, type of surgery, and date of procedure or surgery.  *Opioid medications include: morphine, codeine, oxycodone, oxymorphone, hydrocodone, hydromorphone, meperidine, tramadol, tapentadol, buprenorphine, fentanyl, methadone. **Benzodiazepine medications include: diazepam (Valium), alprazolam (Xanax), clonazepam (Klonopine), lorazepam (Ativan), clorazepate (Tranxene), chlordiazepoxide (Librium), estazolam (Prosom), oxazepam (Serax), temazepam (Restoril), triazolam (Halcion) (Last updated: 11/28/2017) ____________________________________________________________________________________________    BMI Assessment: Estimated body mass index is 36.81 kg/m as calculated from the following:   Height as of this encounter: 5' 7"  (1.702 m).   Weight as of this encounter: 235 lb (106.6 kg).  BMI interpretation table: BMI level Category Range association with higher incidence of chronic pain  <18 kg/m2 Underweight   18.5-24.9 kg/m2 Ideal body weight   25-29.9 kg/m2 Overweight Increased incidence by 20%  30-34.9 kg/m2 Obese (Class I) Increased incidence by 68%  35-39.9 kg/m2 Severe obesity (Class II) Increased incidence by 136%  >40 kg/m2 Extreme obesity (Class III) Increased incidence by 254%   Patient's current BMI Ideal Body weight  Body mass index is 36.81 kg/m. Ideal body weight: 66.1 kg (145 lb 11.6 oz) Adjusted ideal body weight: 82.3 kg (181 lb 6.9 oz)   BMI Readings from Last 4 Encounters:  10/22/18 36.81 kg/m  07/23/18 35.73 kg/m  07/14/18 35.84 kg/m  04/22/18 38.01 kg/m   Wt Readings from Last 4 Encounters:  10/22/18 235 lb (106.6 kg)   07/23/18 235 lb (106.6 kg)  07/14/18 235 lb 11.2 oz (106.9 kg)  04/22/18 250 lb (113.4 kg)   ______________________________________________________________________________________________  Specialty Pain Scale  Introduction:  There are significant differences in how pain is reported. The word pain usually refers to physical pain, but it is also a common synonym of suffering. The medical community uses a scale from 0 (zero) to 10 (ten) to report pain level. Zero (0) is described as "no pain", while ten (10) is described as "the worse pain you can imagine". The problem with this scale is that physical pain is reported along with suffering. Suffering refers to mental pain, or more often yet it refers to any unpleasant feeling, emotion or aversion associated with the perception of harm or threat of harm. It is the psychological component of pain.  Pain Specialists prefer to separate the two components. The pain scale used by this practice is the Verbal Numerical Rating Scale (VNRS-11). This scale is for the physical pain only. DO NOT INCLUDE how your pain psychologically affects you. This scale is for adults 38 years of age and older. It has 11 (eleven) levels. The 1st level is 0/10. This means: "right now, I have no pain". In the context of pain management, it also means: "right now, my physical pain is under control with the current therapy".  General Information:  The scale should reflect your current level of pain. Unless you are specifically asked for the level of your worst pain, or your average pain. If you are asked for one of these two, then it should be understood that it is over the past 24 hours.  Levels 1 (one) through 5 (five) are described below, and can be treated as an outpatient. Ambulatory pain management facilities such as ours are more than adequate to treat these levels. Levels 6 (six) through 10 (ten) are also described below, however, these must be treated as a hospitalized  patient. While levels 6 (six) and 7 (  seven) may be evaluated at an urgent care facility, levels 8 (eight) through 10 (ten) constitute medical emergencies and as such, they belong in a hospital's emergency department. When having these levels (as described below), do not come to our office. Our facility is not equipped to manage these levels. Go directly to an urgent care facility or an emergency department to be evaluated.  Definitions:  Activities of Daily Living (ADL): Activities of daily living (ADL or ADLs) is a term used in healthcare to refer to people's daily self-care activities. Health professionals often use a person's ability or inability to perform ADLs as a measurement of their functional status, particularly in regard to people post injury, with disabilities and the elderly. There are two ADL levels: Basic and Instrumental. Basic Activities of Daily Living (BADL  or BADLs) consist of self-care tasks that include: Bathing and showering; personal hygiene and grooming (including brushing/combing/styling hair); dressing; Toilet hygiene (getting to the toilet, cleaning oneself, and getting back up); eating and self-feeding (not including cooking or chewing and swallowing); functional mobility, often referred to as "transferring", as measured by the ability to walk, get in and out of bed, and get into and out of a chair; the broader definition (moving from one place to another while performing activities) is useful for people with different physical abilities who are still able to get around independently. Basic ADLs include the things many people do when they get up in the morning and get ready to go out of the house: get out of bed, go to the toilet, bathe, dress, groom, and eat. On the average, loss of function typically follows a particular order. Hygiene is the first to go, followed by loss of toilet use and locomotion. The last to go is the ability to eat. When there is only one remaining area in  which the person is independent, there is a 62.9% chance that it is eating and only a 3.5% chance that it is hygiene. Instrumental Activities of Daily Living (IADL or IADLs) are not necessary for fundamental functioning, but they let an individual live independently in a community. IADL consist of tasks that include: cleaning and maintaining the house; home establishment and maintenance; care of others (including selecting and supervising caregivers); care of pets; child rearing; managing money; managing financials (investments, etc.); meal preparation and cleanup; shopping for groceries and necessities; moving within the community; safety procedures and emergency responses; health management and maintenance (taking prescribed medications); and using the telephone or other form of communication.  Instructions:  Most patients tend to report their pain as a combination of two factors, their physical pain and their psychosocial pain. This last one is also known as "suffering" and it is reflection of how physical pain affects you socially and psychologically. From now on, report them separately.  From this point on, when asked to report your pain level, report only your physical pain. Use the following table for reference.  Pain Clinic Pain Levels (0-5/10)  Pain Level Score  Description  No Pain 0   Mild pain 1 Nagging, annoying, but does not interfere with basic activities of daily living (ADL). Patients are able to eat, bathe, get dressed, toileting (being able to get on and off the toilet and perform personal hygiene functions), transfer (move in and out of bed or a chair without assistance), and maintain continence (able to control bladder and bowel functions). Blood pressure and heart rate are unaffected. A normal heart rate for a healthy adult ranges from 60  to 100 bpm (beats per minute).   Mild to moderate pain 2 Noticeable and distracting. Impossible to hide from other people. More frequent  flare-ups. Still possible to adapt and function close to normal. It can be very annoying and may have occasional stronger flare-ups. With discipline, patients may get used to it and adapt.   Moderate pain 3 Interferes significantly with activities of daily living (ADL). It becomes difficult to feed, bathe, get dressed, get on and off the toilet or to perform personal hygiene functions. Difficult to get in and out of bed or a chair without assistance. Very distracting. With effort, it can be ignored when deeply involved in activities.   Moderately severe pain 4 Impossible to ignore for more than a few minutes. With effort, patients may still be able to manage work or participate in some social activities. Very difficult to concentrate. Signs of autonomic nervous system discharge are evident: dilated pupils (mydriasis); mild sweating (diaphoresis); sleep interference. Heart rate becomes elevated (>115 bpm). Diastolic blood pressure (lower number) rises above 100 mmHg. Patients find relief in laying down and not moving.   Severe pain 5 Intense and extremely unpleasant. Associated with frowning face and frequent crying. Pain overwhelms the senses.  Ability to do any activity or maintain social relationships becomes significantly limited. Conversation becomes difficult. Pacing back and forth is common, as getting into a comfortable position is nearly impossible. Pain wakes you up from deep sleep. Physical signs will be obvious: pupillary dilation; increased sweating; goosebumps; brisk reflexes; cold, clammy hands and feet; nausea, vomiting or dry heaves; loss of appetite; significant sleep disturbance with inability to fall asleep or to remain asleep. When persistent, significant weight loss is observed due to the complete loss of appetite and sleep deprivation.  Blood pressure and heart rate becomes significantly elevated. Caution: If elevated blood pressure triggers a pounding headache associated with blurred  vision, then the patient should immediately seek attention at an urgent or emergency care unit, as these may be signs of an impending stroke.    Emergency Department Pain Levels (6-10/10)  Emergency Room Pain 6 Severely limiting. Requires emergency care and should not be seen or managed at an outpatient pain management facility. Communication becomes difficult and requires great effort. Assistance to reach the emergency department may be required. Facial flushing and profuse sweating along with potentially dangerous increases in heart rate and blood pressure will be evident.   Distressing pain 7 Self-care is very difficult. Assistance is required to transport, or use restroom. Assistance to reach the emergency department will be required. Tasks requiring coordination, such as bathing and getting dressed become very difficult.   Disabling pain 8 Self-care is no longer possible. At this level, pain is disabling. The individual is unable to do even the most "basic" activities such as walking, eating, bathing, dressing, transferring to a bed, or toileting. Fine motor skills are lost. It is difficult to think clearly.   Incapacitating pain 9 Pain becomes incapacitating. Thought processing is no longer possible. Difficult to remember your own name. Control of movement and coordination are lost.   The worst pain imaginable 10 At this level, most patients pass out from pain. When this level is reached, collapse of the autonomic nervous system occurs, leading to a sudden drop in blood pressure and heart rate. This in turn results in a temporary and dramatic drop in blood flow to the brain, leading to a loss of consciousness. Fainting is one of the body's self defense mechanisms. Passing  out puts the brain in a calmed state and causes it to shut down for a while, in order to begin the healing process.    Summary: 1. Refer to this scale when providing [place] with your pain level. 2. Be accurate and careful when  reporting your pain level. This will help with your care. 3. Over-reporting your pain level will lead to loss of credibility. 4. Even a level of 1/10 means that there is pain and will be treated at our facility. 5. High, inaccurate reporting will be documented as "Symptom Exaggeration", leading to loss of credibility and suspicions of possible secondary gains such as obtaining more narcotics, or wanting to appear disabled, for fraudulent reasons. 6. Only pain levels of 5 or below will be seen at our facility. 7. Pain levels of 6 and above will be sent to the Emergency Department and the appointment cancelled. ______________________________________________________________________________________________

## 2018-10-22 NOTE — Patient Instructions (Addendum)
____________________________________________________________________________________________  Medication Rules  Purpose: To inform patients, and their family members, of our rules and regulations.  Applies to: All patients receiving prescriptions (written or electronic).  Pharmacy of record: Pharmacy where electronic prescriptions will be sent. If written prescriptions are taken to a different pharmacy, please inform the nursing staff. The pharmacy listed in the electronic medical record should be the one where you would like electronic prescriptions to be sent.  Electronic prescriptions: In compliance with the [place] [employer] Opioid Misuse Prevention (STOP) Act of 2017 (Session Law 2017-74/H243), effective October 01, 2018, all controlled substances must be electronically prescribed. Calling prescriptions to the pharmacy will cease to exist.  Prescription refills: Only during scheduled appointments. Applies to all prescriptions.  NOTE: The following applies primarily to controlled substances (Opioid* Pain Medications).   Patient's responsibilities: 1. Pain Pills: Bring all pain pills to every appointment (except for procedure appointments). 2. Pill Bottles: Bring pills in original pharmacy bottle. Always bring the newest bottle. Bring bottle, even if empty. 3. Medication refills: You are responsible for knowing and keeping track of what medications you take and those you need refilled. The day before your appointment: write a list of all prescriptions that need to be refilled. The day of the appointment: give the list to the admitting nurse. Prescriptions will be written only during appointments. If you forget a medication: it will not be "Called in", "Faxed", or "electronically sent". You will need to get another appointment to get these prescribed. No early refills. Do not call asking to have your prescription filled early. 4. Prescription Accuracy: You are responsible for  carefully inspecting your prescriptions before leaving our office. Have the discharge nurse carefully go over each prescription with you, before taking them home. Make sure that your name is accurately spelled, that your address is correct. Check the name and dose of your medication to make sure it is accurate. Check the number of pills, and the written instructions to make sure they are clear and accurate. Make sure that you are given enough medication to last until your next medication refill appointment. 5. Taking Medication: Take medication as prescribed. When it comes to controlled substances, taking less pills or less frequently than prescribed is permitted and encouraged. Never take more pills than instructed. Never take medication more frequently than prescribed.  6. Inform other Doctors: Always inform, all of your healthcare providers, of all the medications you take. 7. Pain Medication from other Providers: You are not allowed to accept any additional pain medication from any other Doctor or Healthcare provider. There are two exceptions to this rule. (see below) In the event that you require additional pain medication, you are responsible for notifying us, as stated below. 8. Medication Agreement: You are responsible for carefully reading and following our Medication Agreement. This must be signed before receiving any prescriptions from our practice. Safely store a copy of your signed Agreement. Violations to the Agreement will result in no further prescriptions. (Additional copies of our Medication Agreement are available upon request.) 9. Laws, Rules, & Regulations: All patients are expected to follow all Federal and State Laws, Statutes, Rules, & Regulations. Ignorance of the Laws does not constitute a valid excuse. The use of any illegal substances is prohibited. 10. Adopted CDC guidelines & recommendations: Target dosing levels will be at or below 60 MME/day. Use of benzodiazepines** is not  recommended.  Exceptions: There are only two exceptions to the rule of not receiving pain medications from other Healthcare Providers. 1.   Exception #1 (Emergencies): In the event of an emergency (i.e.: accident requiring emergency care), you are allowed to receive additional pain medication. However, you are responsible for: As soon as you are able, call our office [phone], at any time of the day or night, and leave a message stating your name, the date and nature of the emergency, and the name and dose of the medication prescribed. In the event that your call is answered by a member of our staff, make sure to document and save the date, time, and the name of the person that took your information.  2. Exception #2 (Planned Surgery): In the event that you are scheduled by another doctor or dentist to have any type of surgery or procedure, you are allowed (for a period no longer than 30 days), to receive additional pain medication, for the acute post-op pain. However, in this case, you are responsible for picking up a copy of our "Post-op Pain Management for Surgeons" handout, and giving it to your surgeon or dentist. This document is available at our office, and does not require an appointment to obtain it. Simply go to our office during business hours (Monday-Thursday from 8:00 AM to 4:00 PM) (Friday 8:00 AM to 12:00 Noon) or if you have a scheduled appointment with [place], prior to your surgery, and ask for it by name. In addition, you will need to provide [place] with your name, name of your surgeon, type of surgery, and date of procedure or surgery.  *Opioid medications include: morphine, codeine, oxycodone, oxymorphone, hydrocodone, hydromorphone, meperidine, tramadol, tapentadol, buprenorphine, fentanyl, methadone. **Benzodiazepine medications include: diazepam (Valium), alprazolam (Xanax), clonazepam (Klonopine), lorazepam (Ativan), clorazepate (Tranxene), chlordiazepoxide (Librium), estazolam (Prosom),  oxazepam (Serax), temazepam (Restoril), triazolam (Halcion) (Last updated: 11/28/2017) ____________________________________________________________________________________________    BMI Assessment: Estimated body mass index is 36.81 kg/m as calculated from the following:   Height as of this encounter: 5\' 7"  (1.702 m).   Weight as of this encounter: 235 lb (106.6 kg).  BMI interpretation table: BMI level Category Range association with higher incidence of chronic pain  <18 kg/m2 Underweight   18.5-24.9 kg/m2 Ideal body weight   25-29.9 kg/m2 Overweight Increased incidence by 20%  30-34.9 kg/m2 Obese (Class I) Increased incidence by 68%  35-39.9 kg/m2 Severe obesity (Class II) Increased incidence by 136%  >40 kg/m2 Extreme obesity (Class III) Increased incidence by 254%   Patient's current BMI Ideal Body weight  Body mass index is 36.81 kg/m. Ideal body weight: 66.1 kg (145 lb 11.6 oz) Adjusted ideal body weight: 82.3 kg (181 lb 6.9 oz)   BMI Readings from Last 4 Encounters:  10/22/18 36.81 kg/m  07/23/18 35.73 kg/m  07/14/18 35.84 kg/m  04/22/18 38.01 kg/m   Wt Readings from Last 4 Encounters:  10/22/18 235 lb (106.6 kg)  07/23/18 235 lb (106.6 kg)  07/14/18 235 lb 11.2 oz (106.9 kg)  04/22/18 250 lb (113.4 kg)   ______________________________________________________________________________________________  Specialty Pain Scale  Introduction:  There are significant differences in how pain is reported. The word pain usually refers to physical pain, but it is also a common synonym of suffering. The medical community uses a scale from 0 (zero) to 10 (ten) to report pain level. Zero (0) is described as "no pain", while ten (10) is described as "the worse pain you can imagine". The problem with this scale is that physical pain is reported along with suffering. Suffering refers to mental pain, or more often yet it refers to any unpleasant feeling, emotion  or aversion  associated with the perception of harm or threat of harm. It is the psychological component of pain.  Pain Specialists prefer to separate the two components. The pain scale used by this practice is the Verbal Numerical Rating Scale (VNRS-11). This scale is for the physical pain only. DO NOT INCLUDE how your pain psychologically affects you. This scale is for adults 23 years of age and older. It has 11 (eleven) levels. The 1st level is 0/10. This means: "right now, I have no pain". In the context of pain management, it also means: "right now, my physical pain is under control with the current therapy".  General Information:  The scale should reflect your current level of pain. Unless you are specifically asked for the level of your worst pain, or your average pain. If you are asked for one of these two, then it should be understood that it is over the past 24 hours.  Levels 1 (one) through 5 (five) are described below, and can be treated as an outpatient. Ambulatory pain management facilities such as ours are more than adequate to treat these levels. Levels 6 (six) through 10 (ten) are also described below, however, these must be treated as a hospitalized patient. While levels 6 (six) and 7 (seven) may be evaluated at an urgent care facility, levels 8 (eight) through 10 (ten) constitute medical emergencies and as such, they belong in a hospital's emergency department. When having these levels (as described below), do not come to our office. Our facility is not equipped to manage these levels. Go directly to an urgent care facility or an emergency department to be evaluated.  Definitions:  Activities of Daily Living (ADL): Activities of daily living (ADL or ADLs) is a term used in healthcare to refer to people's daily self-care activities. Health professionals often use a person's ability or inability to perform ADLs as a measurement of their functional status, particularly in regard to people post injury,  with disabilities and the elderly. There are two ADL levels: Basic and Instrumental. Basic Activities of Daily Living (BADL  or BADLs) consist of self-care tasks that include: Bathing and showering; personal hygiene and grooming (including brushing/combing/styling hair); dressing; Toilet hygiene (getting to the toilet, cleaning oneself, and getting back up); eating and self-feeding (not including cooking or chewing and swallowing); functional mobility, often referred to as "transferring", as measured by the ability to walk, get in and out of bed, and get into and out of a chair; the broader definition (moving from one place to another while performing activities) is useful for people with different physical abilities who are still able to get around independently. Basic ADLs include the things many people do when they get up in the morning and get ready to go out of the house: get out of bed, go to the toilet, bathe, dress, groom, and eat. On the average, loss of function typically follows a particular order. Hygiene is the first to go, followed by loss of toilet use and locomotion. The last to go is the ability to eat. When there is only one remaining area in which the person is independent, there is a 62.9% chance that it is eating and only a 3.5% chance that it is hygiene. Instrumental Activities of Daily Living (IADL or IADLs) are not necessary for fundamental functioning, but they let an individual live independently in a community. IADL consist of tasks that include: cleaning and maintaining the house; home establishment and maintenance; care of others (including selecting  and supervising caregivers); care of pets; child rearing; managing money; managing financials (investments, etc.); meal preparation and cleanup; shopping for groceries and necessities; moving within the community; safety procedures and emergency responses; health management and maintenance (taking prescribed medications); and using the  telephone or other form of communication.  Instructions:  Most patients tend to report their pain as a combination of two factors, their physical pain and their psychosocial pain. This last one is also known as "suffering" and it is reflection of how physical pain affects you socially and psychologically. From now on, report them separately.  From this point on, when asked to report your pain level, report only your physical pain. Use the following table for reference.  Pain Clinic Pain Levels (0-5/10)  Pain Level Score  Description  No Pain 0   Mild pain 1 Nagging, annoying, but does not interfere with basic activities of daily living (ADL). Patients are able to eat, bathe, get dressed, toileting (being able to get on and off the toilet and perform personal hygiene functions), transfer (move in and out of bed or a chair without assistance), and maintain continence (able to control bladder and bowel functions). Blood pressure and heart rate are unaffected. A normal heart rate for a healthy adult ranges from 60 to 100 bpm (beats per minute).   Mild to moderate pain 2 Noticeable and distracting. Impossible to hide from other people. More frequent flare-ups. Still possible to adapt and function close to normal. It can be very annoying and may have occasional stronger flare-ups. With discipline, patients may get used to it and adapt.   Moderate pain 3 Interferes significantly with activities of daily living (ADL). It becomes difficult to feed, bathe, get dressed, get on and off the toilet or to perform personal hygiene functions. Difficult to get in and out of bed or a chair without assistance. Very distracting. With effort, it can be ignored when deeply involved in activities.   Moderately severe pain 4 Impossible to ignore for more than a few minutes. With effort, patients may still be able to manage work or participate in some social activities. Very difficult to concentrate. Signs of autonomic  nervous system discharge are evident: dilated pupils (mydriasis); mild sweating (diaphoresis); sleep interference. Heart rate becomes elevated (>115 bpm). Diastolic blood pressure (lower number) rises above 100 mmHg. Patients find relief in laying down and not moving.   Severe pain 5 Intense and extremely unpleasant. Associated with frowning face and frequent crying. Pain overwhelms the senses.  Ability to do any activity or maintain social relationships becomes significantly limited. Conversation becomes difficult. Pacing back and forth is common, as getting into a comfortable position is nearly impossible. Pain wakes you up from deep sleep. Physical signs will be obvious: pupillary dilation; increased sweating; goosebumps; brisk reflexes; cold, clammy hands and feet; nausea, vomiting or dry heaves; loss of appetite; significant sleep disturbance with inability to fall asleep or to remain asleep. When persistent, significant weight loss is observed due to the complete loss of appetite and sleep deprivation.  Blood pressure and heart rate becomes significantly elevated. Caution: If elevated blood pressure triggers a pounding headache associated with blurred vision, then the patient should immediately seek attention at an urgent or emergency care unit, as these may be signs of an impending stroke.    Emergency Department Pain Levels (6-10/10)  Emergency Room Pain 6 Severely limiting. Requires emergency care and should not be seen or managed at an outpatient pain management facility. Communication becomes difficult  and requires great effort. Assistance to reach the emergency department may be required. Facial flushing and profuse sweating along with potentially dangerous increases in heart rate and blood pressure will be evident.   Distressing pain 7 Self-care is very difficult. Assistance is required to transport, or use restroom. Assistance to reach the emergency department will be required. Tasks requiring  coordination, such as bathing and getting dressed become very difficult.   Disabling pain 8 Self-care is no longer possible. At this level, pain is disabling. The individual is unable to do even the most "basic" activities such as walking, eating, bathing, dressing, transferring to a bed, or toileting. Fine motor skills are lost. It is difficult to think clearly.   Incapacitating pain 9 Pain becomes incapacitating. Thought processing is no longer possible. Difficult to remember your own name. Control of movement and coordination are lost.   The worst pain imaginable 10 At this level, most patients pass out from pain. When this level is reached, collapse of the autonomic nervous system occurs, leading to a sudden drop in blood pressure and heart rate. This in turn results in a temporary and dramatic drop in blood flow to the brain, leading to a loss of consciousness. Fainting is one of the body's self defense mechanisms. Passing out puts the brain in a calmed state and causes it to shut down for a while, in order to begin the healing process.    Summary: 1. Refer to this scale when providing [place] with your pain level. 2. Be accurate and careful when reporting your pain level. This will help with your care. 3. Over-reporting your pain level will lead to loss of credibility. 4. Even a level of 1/10 means that there is pain and will be treated at our facility. 5. High, inaccurate reporting will be documented as "Symptom Exaggeration", leading to loss of credibility and suspicions of possible secondary gains such as obtaining more narcotics, or wanting to appear disabled, for fraudulent reasons. 6. Only pain levels of 5 or below will be seen at our facility. 7. Pain levels of 6 and above will be sent to the Emergency Department and the appointment cancelled. ______________________________________________________________________________________________

## 2019-01-19 ENCOUNTER — Other Ambulatory Visit: Payer: Self-pay

## 2019-01-19 ENCOUNTER — Ambulatory Visit (INDEPENDENT_AMBULATORY_CARE_PROVIDER_SITE_OTHER): Payer: 59 | Admitting: Family Medicine

## 2019-01-19 ENCOUNTER — Encounter: Payer: Self-pay | Admitting: Family Medicine

## 2019-01-19 DIAGNOSIS — E782 Mixed hyperlipidemia: Secondary | ICD-10-CM

## 2019-01-19 DIAGNOSIS — F325 Major depressive disorder, single episode, in full remission: Secondary | ICD-10-CM

## 2019-01-19 DIAGNOSIS — M5416 Radiculopathy, lumbar region: Secondary | ICD-10-CM

## 2019-01-19 DIAGNOSIS — F1120 Opioid dependence, uncomplicated: Secondary | ICD-10-CM

## 2019-01-19 DIAGNOSIS — F411 Generalized anxiety disorder: Secondary | ICD-10-CM

## 2019-01-19 MED ORDER — BUSPIRONE HCL 30 MG PO TABS: 30.0000 mg | ORAL_TABLET | Freq: Every day | ORAL | 0 refills | Status: DC

## 2019-01-19 MED ORDER — CITALOPRAM HYDROBROMIDE 40 MG PO TABS: 40.0000 mg | ORAL_TABLET | Freq: Every day | ORAL | 1 refills | Status: DC

## 2019-01-19 MED ORDER — IBUPROFEN 600 MG PO TABS: 600.0000 mg | ORAL_TABLET | Freq: Every day | ORAL | 0 refills | Status: DC

## 2019-01-19 MED ORDER — BUPROPION HCL ER (XL) 300 MG PO TB24: 300.0000 mg | ORAL_TABLET | Freq: Every day | ORAL | 1 refills | Status: DC

## 2019-01-19 MED ORDER — ROSUVASTATIN CALCIUM 40 MG PO TABS: 40.0000 mg | ORAL_TABLET | Freq: Every day | ORAL | 1 refills | Status: DC
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
## 2019-01-20 ENCOUNTER — Other Ambulatory Visit: Payer: Self-pay

## 2019-01-20 ENCOUNTER — Ambulatory Visit: Payer: Medicare PPO | Attending: Nurse Practitioner | Admitting: Nurse Practitioner

## 2019-01-20 DIAGNOSIS — G894 Chronic pain syndrome: Secondary | ICD-10-CM | POA: Diagnosis not present

## 2019-01-20 DIAGNOSIS — M79605 Pain in left leg: Secondary | ICD-10-CM

## 2019-01-20 DIAGNOSIS — G4762 Sleep related leg cramps: Secondary | ICD-10-CM

## 2019-01-20 DIAGNOSIS — M5136 Other intervertebral disc degeneration, lumbar region: Secondary | ICD-10-CM | POA: Diagnosis not present

## 2019-01-20 DIAGNOSIS — M47816 Spondylosis without myelopathy or radiculopathy, lumbar region: Secondary | ICD-10-CM | POA: Diagnosis not present

## 2019-01-20 DIAGNOSIS — M79604 Pain in right leg: Secondary | ICD-10-CM

## 2019-01-20 DIAGNOSIS — G8929 Other chronic pain: Secondary | ICD-10-CM

## 2019-01-20 MED ORDER — TIZANIDINE HCL 2 MG PO TABS: 2.0000 mg | ORAL_TABLET | Freq: Three times a day (TID) | ORAL | 0 refills | Status: DC

## 2019-01-20 MED ORDER — OXYCODONE-ACETAMINOPHEN 5-325 MG PO TABS: 1.0000 | ORAL_TABLET | ORAL | 0 refills | Status: DC | PRN

## 2019-01-20 NOTE — Progress Notes (Signed)
Pain Management Encounter Note - Virtual Visit via Telephone Telehealth (real-time audio visits between healthcare provider and patient).  Patient's Phone No. & Preferred Pharmacy:  [phone] (home); [phone] (mobile); (Preferred) [place], [place] AT [facility] [address] [place] [address] Phone: [phone] Fax: [facility] [place], [place] [address] Phone: [phone] Fax: [phone]   Pre-screening note:  Our staff contacted John Garrett and offered him an "in person", "face-to-face" appointment versus a telephone encounter. He indicated preferring the telephone encounter, at this time.  Reason for Virtual Visit: COVID-19*  Social distancing based on CDC and AMA recommendations.   I contacted John Garrett on 01/20/2019 at 11:07 AM by telephone and clearly identified myself as Dionisio David, NP. I verified that I was speaking with the correct person using two identifiers (Name and date of birth: 31-Dec-1960).  Advanced Informed Consent I sought verbal advanced consent from [facility] for telemedicine interactions and virtual visit. I informed John Garrett of the security and privacy concerns, risks, and limitations associated with performing an evaluation and management service by telephone. I also informed John Garrett of the availability of "in person" appointments and I informed him of the possibility of a patient responsible charge related to this service. John Garrett expressed understanding and agreed to proceed.   Historic Elements   John Garrett is a 58 y.o. year old, male patient evaluated today after his last encounter by our practice on 10/22/2018. John Garrett  has a past medical history of Anxiety, Chronic back pain, Chronic pain, Depression, Hyperlipidemia, and Prediabetes (01/08/2018). He also  has a past surgical history that  includes Cataract extraction (Right, 2014). John Garrett has a current medication list which includes the following prescription(s): bupropion, buspirone, citalopram, ibuprofen, oxycodone-acetaminophen, oxycodone-acetaminophen, oxycodone-acetaminophen, rosuvastatin, and tizanidine. He  reports that he has been smoking cigarettes. He has a 22.00 pack-year smoking history. He quit smokeless tobacco use about 19 years ago.  His smokeless tobacco use included chew and snuff. He reports current alcohol use of about 16.0 standard drinks of alcohol per week. He reports that he does not use drugs. John Garrett has No Known Allergies.   HPI  I last saw him on 10/22/2018. He is being evaluated for medication management. He rates his lower back pain a 5/10. He does have pain that shoots down his right leg into the calf. He does not have any numbness or tingling in his legs. He does feel like the tizanidine is effective for muscle relaxer. He denies anything new or different about his pain. He feels like everything is about the same. He denies any side effects of the oxycodone.   Pharmacotherapy Assessment  Analgesic:Oxycodone/APAP 5/325 one tablet every 4 hours when necessary for pain MME/day:45mg /day.  Monitoring: Pharmacotherapy: No side-effects or adverse reactions reported. [place] PMP: PDMP reviewed during this encounter.       Compliance: No problems identified. Plan: Refer to "POC".  Review of recent tests  DG C-Arm 1-60 Min-No Report Fluoroscopy was utilized by the requesting physician.  No radiographic  interpretation.    Clinical Support on 07/23/2018  Component Date Value Ref Range Status  . Summary 07/23/2018 FINAL   Final   Comment: ==================================================================== TOXASSURE SELECT 13 (MW) ==================================================================== Test  Result       Flag       Units Drug Present and Declared for  Prescription Verification   Oxycodone                      1769         EXPECTED   ng/mg creat   Oxymorphone                    78           EXPECTED   ng/mg creat   Noroxycodone                   1707         EXPECTED   ng/mg creat    Sources of oxycodone include scheduled prescription medications.    Oxymorphone and noroxycodone are expected metabolites of    oxycodone. Oxymorphone is also available as a scheduled    prescription medication. ==================================================================== Test                      Result    Flag   Units      Ref Range   Creatinine              142              mg/dL      >=20 ==================================================================== Declared Medication                          s:  The flagging and interpretation on this report are based on the  following declared medications.  Unexpected results may arise from  inaccuracies in the declared medications.  **Note: The testing scope of this panel includes these medications:  Oxycodone (Oxycodone Acetaminophen)  **Note: The testing scope of this panel does not include following  reported medications:  Acetaminophen (Oxycodone Acetaminophen)  Bupropion  Buspirone  Citalopram  Ibuprofen  Rosuvastatin  Tizanidine ==================================================================== For clinical consultation, please call [phone]. ====================================================================    Assessment  The primary encounter diagnosis was Lumbar spondylosis. Diagnoses of Chronic pain syndrome, Nocturnal leg cramps, DDD (degenerative disc disease), lumbar, and Chronic lower extremity pain (Secondary Area of Pain) (Bilateral) (R>L) were also pertinent to this visit.  Plan of Care  I have discontinued John Garrett's tiZANidine. I have also changed his tiZANidine. Additionally, I am having him maintain his buPROPion, busPIRone, citalopram, ibuprofen,  rosuvastatin, oxyCODONE-acetaminophen, oxyCODONE-acetaminophen, and oxyCODONE-acetaminophen.  Pharmacotherapy (Medications Ordered): Meds ordered this encounter  Medications  . oxyCODONE-acetaminophen (PERCOCET) 5-325 MG tablet    Sig: Take 1 tablet by mouth every 4 (four) hours as needed for up to 30 days for severe pain.    Dispense:  180 tablet    Refill:  0    Do not add this medication to the electronic "Automatic Refill" notification system. Patient may have prescription filled one day early if pharmacy is closed on scheduled refill date.    Order Specific Question:   Supervising Provider    Answer:   Milinda Pointer [phone]  . oxyCODONE-acetaminophen (PERCOCET) 5-325 MG tablet    Sig: Take 1 tablet by mouth every 4 (four) hours as needed for up to 30 days for severe pain.    Dispense:  180 tablet    Refill:  0    Do not place this medication, or any other prescription from our practice, on "Automatic Refill". Patient may have  prescription filled one day early if pharmacy is closed on scheduled refill date.    Order Specific Question:   Supervising Provider    Answer:   Milinda Pointer [phone]  . oxyCODONE-acetaminophen (PERCOCET) 5-325 MG tablet    Sig: Take 1 tablet by mouth every 4 (four) hours as needed for up to 30 days for severe pain.    Dispense:  180 tablet    Refill:  0    Do not place this medication, or any other prescription from our practice, on "Automatic Refill". Patient may have prescription filled one day early if pharmacy is closed on scheduled refill date.    Order Specific Question:   Supervising Provider    Answer:   Milinda Pointer [phone]  . tiZANidine (ZANAFLEX) 2 MG tablet    Sig: Take 1 tablet (2 mg total) by mouth 3 (three) times daily for 30 days.    Dispense:  90 tablet    Refill:  0    Order Specific Question:   Supervising Provider    Answer:   Milinda Pointer [phone]   Orders:  No orders of the defined types were placed in this  encounter.  Follow-up plan:   Return in about 3 months (around 04/21/2019) for MedMgmt.   I discussed the assessment and treatment plan with the patient. The patient was provided an opportunity to ask questions and all were answered. The patient agreed with the plan and demonstrated an understanding of the instructions.  Patient advised to call back or seek an in-person evaluation if the symptoms or condition worsens.  Total duration of non-face-to-face encounter: 20 minutes.  Note by: Dionisio David, NP Date: 01/20/2019; Time: 11:23 AM  Disclaimer:  * Given the special circumstances of the COVID-19 pandemic, the federal government has announced that the Office for Civil Rights (OCR) will exercise its enforcement discretion and will not impose penalties on physicians using telehealth in the event of noncompliance with regulatory requirements under the [place] (HIPAA) in connection with the good faith provision of telehealth during the WPVXY-80 national public health emergency. ([place])

## 2019-01-20 NOTE — Patient Instructions (Signed)
____________________________________________________________________________________________  Medication Rules  Purpose: To inform patients, and their family members, of our rules and regulations.  Applies to: All patients receiving prescriptions (written or electronic).  Pharmacy of record: Pharmacy where electronic prescriptions will be sent. If written prescriptions are taken to a different pharmacy, please inform the nursing staff. The pharmacy listed in the electronic medical record should be the one where you would like electronic prescriptions to be sent.  Electronic prescriptions: In compliance with the [place] Strengthen Opioid Misuse Prevention (STOP) Act of 2017 (Session Law 2017-74/H243), effective October 01, 2018, all controlled substances must be electronically prescribed. Calling prescriptions to the pharmacy will cease to exist.  Prescription refills: Only during scheduled appointments. Applies to all prescriptions.  NOTE: The following applies primarily to controlled substances (Opioid* Pain Medications).   Patient's responsibilities: 1. Pain Pills: Bring all pain pills to every appointment (except for procedure appointments). 2. Pill Bottles: Bring pills in original pharmacy bottle. Always bring the newest bottle. Bring bottle, even if empty. 3. Medication refills: You are responsible for knowing and keeping track of what medications you take and those you need refilled. The day before your appointment: write a list of all prescriptions that need to be refilled. The day of the appointment: give the list to the admitting nurse. Prescriptions will be written only during appointments. No prescriptions will be written on procedure days. If you forget a medication: it will not be "Called in", "Faxed", or "electronically sent". You will need to get another appointment to get these prescribed. No early refills. Do not call asking to have your prescription filled  early. 4. Prescription Accuracy: You are responsible for carefully inspecting your prescriptions before leaving our office. Have the discharge nurse carefully go over each prescription with you, before taking them home. Make sure that your name is accurately spelled, that your address is correct. Check the name and dose of your medication to make sure it is accurate. Check the number of pills, and the written instructions to make sure they are clear and accurate. Make sure that you are given enough medication to last until your next medication refill appointment. 5. Taking Medication: Take medication as prescribed. When it comes to controlled substances, taking less pills or less frequently than prescribed is permitted and encouraged. Never take more pills than instructed. Never take medication more frequently than prescribed.  6. Inform other Doctors: Always inform, all of your healthcare providers, of all the medications you take. 7. Pain Medication from other Providers: You are not allowed to accept any additional pain medication from any other Doctor or Healthcare provider. There are two exceptions to this rule. (see below) In the event that you require additional pain medication, you are responsible for notifying us, as stated below. 8. Medication Agreement: You are responsible for carefully reading and following our Medication Agreement. This must be signed before receiving any prescriptions from our practice. Safely store a copy of your signed Agreement. Violations to the Agreement will result in no further prescriptions. (Additional copies of our Medication Agreement are available upon request.) 9. Laws, Rules, & Regulations: All patients are expected to follow all Federal and State Laws, Statutes, Rules, & Regulations. Ignorance of the Laws does not constitute a valid excuse. The use of any illegal substances is prohibited. 10. Adopted CDC guidelines & recommendations: Target dosing levels will be  at or below 60 MME/day. Use of benzodiazepines** is not recommended.  Exceptions: There are only two exceptions to the rule of not   receiving pain medications from other Healthcare Providers. 1. Exception #1 (Emergencies): In the event of an emergency (i.e.: accident requiring emergency care), you are allowed to receive additional pain medication. However, you are responsible for: As soon as you are able, call our office [phone], at any time of the day or night, and leave a message stating your name, the date and nature of the emergency, and the name and dose of the medication prescribed. In the event that your call is answered by a member of our staff, make sure to document and save the date, time, and the name of the person that took your information.  2. Exception #2 (Planned Surgery): In the event that you are scheduled by another doctor or dentist to have any type of surgery or procedure, you are allowed (for a period no longer than 30 days), to receive additional pain medication, for the acute post-op pain. However, in this case, you are responsible for picking up a copy of our "Post-op Pain Management for Surgeons" handout, and giving it to your surgeon or dentist. This document is available at our office, and does not require an appointment to obtain it. Simply go to our office during business hours (Monday-Thursday from 8:00 AM to 4:00 PM) (Friday 8:00 AM to 12:00 Noon) or if you have a scheduled appointment with us, prior to your surgery, and ask for it by name. In addition, you will need to provide us with your name, name of your surgeon, type of surgery, and date of procedure or surgery.  *Opioid medications include: morphine, codeine, oxycodone, oxymorphone, hydrocodone, hydromorphone, meperidine, tramadol, tapentadol, buprenorphine, fentanyl, methadone. **Benzodiazepine medications include: diazepam (Valium), alprazolam (Xanax), clonazepam (Klonopine), lorazepam (Ativan), clorazepate  (Tranxene), chlordiazepoxide (Librium), estazolam (Prosom), oxazepam (Serax), temazepam (Restoril), triazolam (Halcion) (Last updated: 11/28/2017) ____________________________________________________________________________________________    

## 2019-01-22 ENCOUNTER — Encounter: Payer: Self-pay | Admitting: Family Medicine

## 2019-01-23 ENCOUNTER — Ambulatory Visit: Payer: Self-pay | Admitting: Pharmacist

## 2019-01-23 DIAGNOSIS — F325 Major depressive disorder, single episode, in full remission: Secondary | ICD-10-CM

## 2019-01-23 DIAGNOSIS — F411 Generalized anxiety disorder: Secondary | ICD-10-CM

## 2019-01-23 NOTE — Chronic Care Management (AMB) (Signed)
  Chronic Care Management   Note  01/23/2019 Name: John Garrett MRN: [id number] DOB: 17-Nov-1960  58 y.o. year old male referred to Chronic Care Management by Dr. Steele Sizer for depression. Chronic conditions include controlled DM, HLD, depression. Last office visit with Steele Sizer, MD was 01/19/19   Was unable to reach patient via telephone today and have left HIPAA compliant voicemail asking patient to return my call. (unsuccessful outreach #1).   Follow up: Attempt outreach within 7 days.  Ruben Reason, PharmD Clinical Pharmacist [facility] [phone]

## 2019-02-03 ENCOUNTER — Ambulatory Visit: Payer: Self-pay

## 2019-02-03 DIAGNOSIS — E782 Mixed hyperlipidemia: Secondary | ICD-10-CM

## 2019-02-03 DIAGNOSIS — E8881 Metabolic syndrome: Secondary | ICD-10-CM

## 2019-02-03 DIAGNOSIS — R739 Hyperglycemia, unspecified: Secondary | ICD-10-CM

## 2019-02-03 DIAGNOSIS — F325 Major depressive disorder, single episode, in full remission: Secondary | ICD-10-CM

## 2019-02-03 NOTE — Chronic Care Management (AMB) (Signed)
  Chronic Care Management   Note  02/03/2019 Name: John Garrett MRN: [id number] DOB: 1960/11/20  [name] is a male year old patient who sees Dr. Steele Sizer for primary care. Dr. Ancil Boozer asked the CCM team to consult the patient for chronic care management and care coordination related to his need for social engagement, depression counseling, and community resources. Patient has a history of but not limited to DM2, chronic pain syndrome, Major Depression, Dyslipidemia,. Referral was placed 01/19/2019 during last office visit with PCP. Telephone outreach to patient today to introduce CCM services.  [name] was given information about Chronic Care Management services today including:  1. CCM service includes personalized support from designated clinical staff supervised by his physician, including individualized plan of care and coordination with other care providers 2. 24/7 contact phone numbers for assistance for urgent and routine care needs. 3. Service will only be billed when office clinical staff spend 20 minutes or more in a month to coordinate care. 4. Only one practitioner may furnish and bill the service in a calendar month. 5. The patient may stop CCM services at any time (effective at the end of the month) by phone call to the office staff. 6. The patient will be responsible for cost sharing (co-pay) of up to 20% of the service fee (after annual deductible is met).  Patient did not agree to services and wishes to consider information provided before deciding about enrollment in CCM services.      Plan: Will follow up in 2 weeks per patient request   Bishoy Cupp E. Rollene Rotunda, RN, BSN Nurse Care Coordinator [facility] Care Management  [phone]

## 2019-02-18 ENCOUNTER — Ambulatory Visit: Payer: Self-pay

## 2019-02-18 DIAGNOSIS — F325 Major depressive disorder, single episode, in full remission: Secondary | ICD-10-CM

## 2019-02-18 DIAGNOSIS — E8881 Metabolic syndrome: Secondary | ICD-10-CM

## 2019-02-18 NOTE — Chronic Care Management (AMB) (Signed)
  Chronic Care Management   Note  02/18/2019 Name: John Garrett MRN: [id number] DOB: Jan 01, 1961   [name] is a male year old patientwho sees Dr. Steele Sizer for primary care. Dr. Lenox Ahr the CCM team to consult the patient for chronic care management and care coordination related to his need for social engagement, depression counseling, and community resources. Patient has a history of but not limited to DM2, chronic pain syndrome, Major Depression, Dyslipidemia,. Referral was placed4/20/2020 during last office visit with PCP. Telephone outreach to patient today to assess patients decision for engaging with CCM Team.  SDOH (Social Determinants of Health) screening performed today. See Care Plan Entry related to challenges with: Depression    [name] was given information about Chronic Care Management services today including:  1. CCM service includes personalized support from designated clinical staff supervised by his physician, including individualized plan of care and coordination with other care providers 2. 24/7 contact phone numbers for assistance for urgent and routine care needs. 3. Service will only be billed when office clinical staff spend 20 minutes or more in a month to coordinate care. 4. Only one practitioner may furnish and bill the service in a calendar month. 5. The patient may stop CCM services at any time (effective at the end of the month) by phone call to the office staff. 6. The patient will be responsible for cost sharing (co-pay) of up to 20% of the service fee (after annual deductible is met).  Patient did not agree to services and does not wish to consider at this time.      Dorinne Graeff E. Rollene Rotunda, RN, BSN Nurse Care Coordinator [facility] / [facility] Care Management  [phone]

## 2019-02-20 ENCOUNTER — Encounter: Payer: Self-pay | Admitting: Family Medicine

## 2019-02-20 ENCOUNTER — Ambulatory Visit: Payer: Medicare PPO | Admitting: Family Medicine

## 2019-02-20 ENCOUNTER — Other Ambulatory Visit: Payer: Self-pay

## 2019-02-20 VITALS — BP 112/68 | HR 67 | Temp 98.3°F | Resp 16 | Ht 68.0 in | Wt 218.1 lb

## 2019-02-20 DIAGNOSIS — R739 Hyperglycemia, unspecified: Secondary | ICD-10-CM

## 2019-02-20 DIAGNOSIS — F411 Generalized anxiety disorder: Secondary | ICD-10-CM | POA: Diagnosis not present

## 2019-02-20 DIAGNOSIS — E440 Moderate protein-calorie malnutrition: Secondary | ICD-10-CM | POA: Diagnosis not present

## 2019-02-20 DIAGNOSIS — E785 Hyperlipidemia, unspecified: Secondary | ICD-10-CM | POA: Diagnosis not present

## 2019-02-20 DIAGNOSIS — M5136 Other intervertebral disc degeneration, lumbar region: Secondary | ICD-10-CM | POA: Diagnosis not present

## 2019-02-20 DIAGNOSIS — Z113 Encounter for screening for infections with a predominantly sexual mode of transmission: Secondary | ICD-10-CM

## 2019-02-20 DIAGNOSIS — F325 Major depressive disorder, single episode, in full remission: Secondary | ICD-10-CM

## 2019-02-20 DIAGNOSIS — G894 Chronic pain syndrome: Secondary | ICD-10-CM | POA: Diagnosis not present

## 2019-02-20 DIAGNOSIS — N528 Other male erectile dysfunction: Secondary | ICD-10-CM

## 2019-02-20 DIAGNOSIS — R7303 Prediabetes: Secondary | ICD-10-CM | POA: Diagnosis not present

## 2019-02-20 MED ORDER — BUSPIRONE HCL 30 MG PO TABS: 30.0000 mg | ORAL_TABLET | Freq: Every day | ORAL | 0 refills | Status: DC

## 2019-02-20 MED ORDER — HYDROXYZINE HCL 10 MG PO TABS: 10.0000 mg | ORAL_TABLET | Freq: Three times a day (TID) | ORAL | 0 refills | Status: DC | PRN

## 2019-02-20 MED ORDER — SILDENAFIL CITRATE 100 MG PO TABS: 50.0000 mg | ORAL_TABLET | Freq: Every day | ORAL | 0 refills | Status: DC | PRN
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
## 2019-02-24 ENCOUNTER — Encounter: Payer: Self-pay | Admitting: Family Medicine

## 2019-02-24 LAB — COMPLETE METABOLIC PANEL WITH GFR
AG Ratio: 1.9 (calc) (ref 1.0–2.5)
ALT: 37 U/L (ref 9–46)
AST: 25 U/L (ref 10–35)
Albumin: 4.2 g/dL (ref 3.6–5.1)
Alkaline phosphatase (APISO): 61 U/L (ref 35–144)
BUN: 15 mg/dL (ref 7–25)
CO2: 26 mmol/L (ref 20–32)
Calcium: 9.6 mg/dL (ref 8.6–10.3)
Chloride: 102 mmol/L (ref 98–110)
Creat: 0.87 mg/dL (ref 0.70–1.33)
GFR, Est African American: 111 mL/min/{1.73_m2} (ref >=60)
GFR, Est Non African American: 96 mL/min/{1.73_m2} (ref >=60)
Globulin: 2.2 g/dL (calc) (ref 1.9–3.7)
Glucose, Bld: 101 mg/dL — ABNORMAL HIGH (ref 65–99)
Potassium: 4.6 mmol/L (ref 3.5–5.3)
Sodium: 138 mmol/L (ref 135–146)
Total Bilirubin: 0.5 mg/dL (ref 0.2–1.2)
Total Protein: 6.4 g/dL (ref 6.1–8.1)

## 2019-02-24 LAB — CBC WITH DIFFERENTIAL/PLATELET
Absolute Monocytes: 736 cells/uL (ref 200–950)
Basophils Absolute: 88 cells/uL (ref 0–200)
Basophils Relative: 1.1 %
Eosinophils Absolute: 136 cells/uL (ref 15–500)
Eosinophils Relative: 1.7 %
HCT: 48.4 % (ref 38.5–50.0)
Hemoglobin: 16.2 g/dL (ref 13.2–17.1)
Lymphs Abs: 2664 cells/uL (ref 850–3900)
MCH: 31.5 pg (ref 27.0–33.0)
MCHC: 33.5 g/dL (ref 32.0–36.0)
MCV: 94 fL (ref 80.0–100.0)
MPV: 11.5 fL (ref 7.5–12.5)
Monocytes Relative: 9.2 %
Neutro Abs: 4376 cells/uL (ref 1500–7800)
Neutrophils Relative %: 54.7 %
Platelets: 243 10*3/uL (ref 140–400)
RBC: 5.15 10*6/uL (ref 4.20–5.80)
RDW: 13.1 % (ref 11.0–15.0)
Total Lymphocyte: 33.3 %
WBC: 8 10*3/uL (ref 3.8–10.8)

## 2019-02-24 LAB — HEPATITIS PANEL, ACUTE
Hep A IgM: NONREACTIVE
Hep B C IgM: NONREACTIVE
Hepatitis B Surface Ag: NONREACTIVE
Hepatitis C Ab: NONREACTIVE
SIGNAL TO CUT-OFF: 0.02 (ref <1.00)

## 2019-02-24 LAB — RPR: RPR Ser Ql: NONREACTIVE

## 2019-02-24 LAB — THYROID PANEL WITH TSH
Free Thyroxine Index: 2.2 (ref 1.4–3.8)
T3 Uptake: 32 % (ref 22–35)
T4, Total: 6.9 ug/dL (ref 4.9–10.5)
TSH: 0.86 mIU/L (ref 0.40–4.50)

## 2019-02-24 LAB — HEMOGLOBIN A1C
Hgb A1c MFr Bld: 5.1 % of total Hgb (ref <5.7)
Mean Plasma Glucose: 100 (calc)
eAG (mmol/L): 5.5 (calc)

## 2019-02-24 LAB — HIV ANTIBODY (ROUTINE TESTING W REFLEX): HIV 1&2 Ab, 4th Generation: NONREACTIVE

## 2019-04-09 ENCOUNTER — Ambulatory Visit (INDEPENDENT_AMBULATORY_CARE_PROVIDER_SITE_OTHER): Payer: Medicare PPO

## 2019-04-09 ENCOUNTER — Telehealth: Payer: Self-pay | Admitting: Family Medicine

## 2019-04-09 ENCOUNTER — Other Ambulatory Visit: Payer: Self-pay | Admitting: Family Medicine

## 2019-04-09 VITALS — Ht 68.0 in | Wt 210.0 lb

## 2019-04-09 DIAGNOSIS — Z0000 Encounter for general adult medical examination without abnormal findings: Secondary | ICD-10-CM

## 2019-04-09 DIAGNOSIS — F411 Generalized anxiety disorder: Secondary | ICD-10-CM

## 2019-04-09 MED ORDER — HYDROXYZINE HCL 10 MG PO TABS: 10.0000 mg | ORAL_TABLET | Freq: Three times a day (TID) | ORAL | 0 refills | Status: DC | PRN

## 2019-04-09 NOTE — Telephone Encounter (Signed)
Spoke to patient during AWV today. Patient requesting refill for hydroxyzine 10 mg. He stated when he picked up the prescription he thought it was meant to take 3 times per day scheduled not just PRN. Pt scheduled f/u appt for 05/25/19. Thank you!

## 2019-04-09 NOTE — Progress Notes (Signed)
Subjective:   John Garrett is a 58 y.o. male who presents for Medicare Annual/Subsequent preventive examination.  Virtual Visit via Telephone Note  I connected with John Garrett on 04/09/19 at 10:40 AM EDT by telephone and verified that I am speaking with the correct person using two identifiers.  Medicare Annual Wellness visit completed telephonically due to Covid-19 pandemic.   Location: Patient: home Provider: office   I discussed the limitations, risks, security and privacy concerns of performing an evaluation and management service by telephone and the availability of in person appointments. The patient expressed understanding and agreed to proceed.    Some vital signs may be absent or patient reported. Pt did not have home blood pressure monitor or thermometer.   Clemetine Marker, LPN  Review of Systems:   Cardiac Risk Factors include: advanced age (>61men, >72 women);dyslipidemia;male gender;obesity (BMI >30kg/m2)     Objective:    Vitals: Ht 5\' 8"  (1.727 m)   Wt 210 lb (95.3 kg)   BMI 31.93 kg/m   Body mass index is 31.93 kg/m.  Advanced Directives 04/09/2019 04/22/2018 04/01/2018 01/15/2018 07/30/2017 07/18/2017 06/19/2017  Does Patient Have a Medical Advance Directive? No No No No No No No  Would patient like information on creating a medical advance directive? No - Patient declined No - Patient declined Yes (MAU/Ambulatory/Procedural Areas - Information given) No - Patient declined - - -    Tobacco Social History   Tobacco Use  Smoking Status Current Every Day Smoker  . Packs/day: 1.00  . Years: 11.00  . Pack years: 11.00  . Types: Cigarettes  Smokeless Tobacco Former Systems developer  . Types: Chew, Snuff  . Quit date: 2001  Tobacco Comment   N/A     Ready to quit: Not Answered Counseling given: Not Answered Comment: N/A   Clinical Intake:  Pre-visit preparation completed: Yes  Pain : 0-10 Pain Score: 7  Pain Type: Chronic pain Pain Location: Back Pain  Orientation: Lower Pain Descriptors / Indicators: Aching, Discomfort, Sore Pain Onset: More than a month ago Pain Frequency: Constant     BMI - recorded: 31.93 Nutritional Status: BMI > 30  Obese Nutritional Risks: None Diabetes: No  How often do you need to have someone help you when you read instructions, pamphlets, or other written materials from your doctor or pharmacy?: 1 - Never  Interpreter Needed?: No  Information entered by :: Clemetine Marker LPN  Past Medical History:  Diagnosis Date  . Anxiety   . Chronic back pain   . Chronic pain   . Depression   . Hyperlipidemia   . Prediabetes 01/08/2018   Past Surgical History:  Procedure Laterality Date  . CATARACT EXTRACTION Right 2014  . NASAL SINUS SURGERY  2004   deviated septum   Family History  Adopted: Yes  Family history unknown: Yes   Social History   Socioeconomic History  . Marital status: Divorced    Spouse name: Not on file  . Number of children: 1  . Years of education: Not on file  . Highest education level: 10th grade  Occupational History  . Occupation: disabled     Comment: since 2004, accident at work 2001, used to do Architect and also Horticulturist, commercial   Social Needs  . Financial resource strain: Somewhat hard  . Food insecurity    Worry: Never true    Inability: Never true  . Transportation needs    Medical: No    Non-medical: No  Tobacco Use  .  Smoking status: Current Every Day Smoker    Packs/day: 1.00    Years: 11.00    Pack years: 11.00    Types: Cigarettes  . Smokeless tobacco: Former Systems developer    Types: Chew, [place] date: 2001  . Tobacco comment: N/A  Substance and Sexual Activity  . Alcohol use: Yes    Alcohol/week: 16.0 standard drinks    Types: 4 Glasses of wine, 12 Cans of beer per week  . Drug use: No  . Sexual activity: Yes    Partners: Female  Lifestyle  . Physical activity    Days per week: 7 days    Minutes per session: 30 min  . Stress: Only a little   Relationships  . Social connections    Talks on phone: More than three times a week    Gets together: More than three times a week    Attends religious service: Never    Active member of club or organization: No    Attends meetings of clubs or organizations: Never    Relationship status: Divorced  Other Topics Concern  . Not on file  Social History Narrative  . Not on file    Outpatient Encounter Medications as of 04/09/2019  Medication Sig  . buPROPion (WELLBUTRIN XL) 300 MG 24 hr tablet Take 1 tablet (300 mg total) by mouth daily.  . busPIRone (BUSPAR) 30 MG tablet Take 1 tablet (30 mg total) by mouth daily.  . citalopram (CELEXA) 40 MG tablet Take 1 tablet (40 mg total) by mouth daily.  . hydrOXYzine (ATARAX/VISTARIL) 10 MG tablet Take 1 tablet (10 mg total) by mouth 3 (three) times daily as needed.  Marland Kitchen ibuprofen (ADVIL) 600 MG tablet Take 1 tablet (600 mg total) by mouth daily.  Marland Kitchen oxyCODONE-acetaminophen (PERCOCET) 5-325 MG tablet Take 1 tablet by mouth every 4 (four) hours as needed for up to 30 days for severe pain.  . rosuvastatin (CRESTOR) 40 MG tablet Take 1 tablet (40 mg total) by mouth daily.  . sildenafil (VIAGRA) 100 MG tablet Take 0.5-1 tablets (50-100 mg total) by mouth daily as needed for erectile dysfunction.  Marland Kitchen tiZANidine (ZANAFLEX) 2 MG tablet Take 1 tablet (2 mg total) by mouth 3 (three) times daily for 30 days.  . [DISCONTINUED] oxyCODONE-acetaminophen (PERCOCET) 5-325 MG tablet Take 1 tablet by mouth every 4 (four) hours as needed for up to 30 days for severe pain.  . [DISCONTINUED] oxyCODONE-acetaminophen (PERCOCET) 5-325 MG tablet Take 1 tablet by mouth every 4 (four) hours as needed for up to 30 days for severe pain.   No facility-administered encounter medications on file as of 04/09/2019.     Activities of Daily Living In your present state of health, do you have any difficulty performing the following activities: 04/09/2019 01/19/2019  Hearing? N N  Comment  declines hearing aids -  Vision? N N  Comment - -  Difficulty concentrating or making decisions? N N  Walking or climbing stairs? N N  Comment - -  Dressing or bathing? N N  Doing errands, shopping? N N  Preparing Food and eating ? N -  Using the Toilet? N -  In the past six months, have you accidently leaked urine? N -  Do you have problems with loss of bowel control? N -  Managing your Medications? N -  Managing your Finances? N -  Housekeeping or managing your Housekeeping? N -  Some recent data might be hidden    Patient  Care Team: Steele Sizer, MD as PCP - General (Family Medicine) [name], MD as Consulting Physician (Pain Medicine)   Assessment:   This is a routine wellness examination for [facility].  Exercise Activities and Dietary recommendations Current Exercise Habits: Home exercise routine, Type of exercise: Other - see comments(swimming), Time (Minutes): 30, Frequency (Times/Week): 7, Weekly Exercise (Minutes/Week): 210, Intensity: Mild, Exercise limited by: orthopedic condition(s)  Goals    . DIET - INCREASE WATER INTAKE     Recommend to drink at least 6-8 8oz glasses of water per day.    . Eat more fruits and vegetables     Recommend eating more vegetables in the daily diet.        Fall Risk Fall Risk  04/09/2019 02/20/2019 01/19/2019 10/22/2018 07/23/2018  Falls in the past year? 0 0 0 0 No  Number falls in past yr: 0 0 0 - -  Injury with Fall? 0 0 0 - -  Risk Factor Category  - - - - -  Risk for fall due to : - - - - -  Risk for fall due to: Comment - - - - -  Follow up Falls prevention discussed - - - -   FALL RISK PREVENTION PERTAINING TO THE HOME:  Any stairs in or around the home? Yes  If so, do they handrails? Yes   Home free of loose throw rugs in walkways, pet beds, electrical cords, etc? Yes  Adequate lighting in your home to reduce risk of falls? Yes   ASSISTIVE DEVICES UTILIZED TO PREVENT FALLS:  Life alert? No  Use of a cane,  walker or w/c? Yes  - walking stick Grab bars in the bathroom? Yes  Shower chair or bench in shower? No  Elevated toilet seat or a handicapped toilet? No   DME ORDERS:  DME order needed?  No   TIMED UP AND GO:  Was the test performed? No . Telephonic visit.  Education: Fall risk prevention has been discussed.  Intervention(s) required? No    Depression Screen PHQ 2/9 Scores 04/09/2019 02/20/2019 01/19/2019 07/23/2018  PHQ - 2 Score 2 2 3  0  PHQ- 9 Score 3 5 10  -    Cognitive Function     6CIT Screen 04/09/2019 04/01/2018 01/21/2017  What Year? 0 points 0 points 0 points  What month? 0 points 0 points 0 points  What time? 0 points 0 points 0 points  Count back from 20 0 points 0 points 0 points  Months in reverse 0 points 0 points 4 points  Repeat phrase 2 points 6 points 2 points  Total Score 2 6 6     Immunization History  Administered Date(s) Administered  . Influenza,inj,Quad PF,6+ Mos 07/01/2014, 09/02/2015, 06/19/2017, 07/14/2018  . Influenza-Unspecified 07/01/2014  . Pneumococcal Polysaccharide-23 07/31/2013  . Tdap 08/02/2014, 09/01/2015    Qualifies for Shingles Vaccine? Yes . Due for Shingrix. Education has been provided regarding the importance of this vaccine. Pt has been advised to call insurance company to determine out of pocket expense. Advised may also receive vaccine at local pharmacy or Health Dept. Verbalized acceptance and understanding.  Tdap: Up to date  Flu Vaccine: Up to date  Pneumococcal Vaccine: Up to date   Screening Tests Health Maintenance  Topic Date Due  . INFLUENZA VACCINE  05/02/2019  . Fecal DNA (Cologuard)  01/30/2020  . TETANUS/TDAP  08/31/2025  . Hepatitis C Screening  Completed  . HIV Screening  Completed   Cancer Screenings:  Colorectal  Screening: Cologuard completed 01/29/17. Repeat every 3 years;   Lung Cancer Screening: (Low Dose CT Chest recommended if Age 78-80 years, 30 pack-year currently smoking OR have quit w/in  15years.) does not qualify.    Additional Screening:  Hepatitis C Screening: does qualify; Completed 02/20/19.  Vision Screening: Recommended annual ophthalmology exams for early detection of glaucoma and other disorders of the eye. Is the patient up to date with their annual eye exam?  No  Who is the provider or what is the name of the office in which the pt attends annual eye exams? Not established  If pt is not established with a provider, would they like to be referred to a provider to establish care? No .   Dental Screening: Recommended annual dental exams for proper oral hygiene  Community Resource Referral:  CRR required this visit?  No      Plan:     I have personally reviewed and addressed the Medicare Annual Wellness questionnaire and have noted the following in the patient's chart:  A. Medical and social history B. Use of alcohol, tobacco or illicit drugs  C. Current medications and supplements D. Functional ability and status E.  Nutritional status F.  Physical activity G. Advance directives H. List of other physicians I.  Hospitalizations, surgeries, and ER visits in previous 12 months J.  [place] such as hearing and vision if needed, cognitive and depression L. Referrals and appointments   In addition, I have reviewed and discussed with patient certain preventive protocols, quality metrics, and best practice recommendations. A written personalized care plan for preventive services as well as general preventive health recommendations were provided to patient.   Signed,  Clemetine Marker, LPN Nurse Health Advisor   Nurse Notes:

## 2019-04-09 NOTE — Patient Instructions (Signed)
[name] , Thank you for taking time to come for your Medicare Wellness Visit. I appreciate your ongoing commitment to your health goals. Please review the following plan we discussed and let me know if I can assist you in the future.   Screening recommendations/referrals: Colonoscopy: Cologuard done 01/29/17. Repeat in 2021. Recommended yearly ophthalmology/optometry visit for glaucoma screening and checkup Recommended yearly dental visit for hygiene and checkup  Vaccinations: Influenza vaccine: done 07/14/18 Pneumococcal vaccine: done 07/31/13 Tdap vaccine: done 09/01/15 Shingles vaccine: Shingrix discussed. Please contact your pharmacy for coverage information.   Advanced directives: Advance directive discussed with you today. Even though you declined this today please call our office should you change your mind and we can give you the proper paperwork for you to fill out.  Conditions/risks identified: Continue healthy eating and physical activity.  Next appointment: Please follow up in one year for your Medicare Annual Wellness visit.    Preventive Care 40-64 Years, Male Preventive care refers to lifestyle choices and visits with your health care provider that can promote health and wellness. What does preventive care include?  A yearly physical exam. This is also called an annual well check.  Dental exams once or twice a year.  Routine eye exams. Ask your health care provider how often you should have your eyes checked.  Personal lifestyle choices, including:  Daily care of your teeth and gums.  Regular physical activity.  Eating a healthy diet.  Avoiding tobacco and drug use.  Limiting alcohol use.  Practicing safe sex.  Taking low-dose aspirin every day starting at age 80. What happens during an annual well check? The services and screenings done by your health care provider during your annual well check will depend on your age, overall health, lifestyle risk  factors, and family history of disease. Counseling  Your health care provider may ask you questions about your:  Alcohol use.  Tobacco use.  Drug use.  Emotional well-being.  Home and relationship well-being.  Sexual activity.  Eating habits.  Work and work Statistician. Screening  You may have the following tests or measurements:  Height, weight, and BMI.  Blood pressure.  Lipid and cholesterol levels. These may be checked every 5 years, or more frequently if you are over 55 years old.  Skin check.  Lung cancer screening. You may have this screening every year starting at age 28 if you have a 30-pack-year history of smoking and currently smoke or have quit within the past 15 years.  Fecal occult blood test (FOBT) of the stool. You may have this test every year starting at age 29.  Flexible sigmoidoscopy or colonoscopy. You may have a sigmoidoscopy every 5 years or a colonoscopy every 10 years starting at age 90.  Prostate cancer screening. Recommendations will vary depending on your family history and other risks.  Hepatitis C blood test.  Hepatitis B blood test.  Sexually transmitted disease (STD) testing.  Diabetes screening. This is done by checking your blood sugar (glucose) after you have not eaten for a while (fasting). You may have this done every 1-3 years. Discuss your test results, treatment options, and if necessary, the need for more tests with your health care provider. Vaccines  Your health care provider may recommend certain vaccines, such as:  Influenza vaccine. This is recommended every year.  Tetanus, diphtheria, and acellular pertussis (Tdap, Td) vaccine. You may need a Td booster every 10 years.  Zoster vaccine. You may need this after age 27.  Pneumococcal 13-valent conjugate (PCV13) vaccine. You may need this if you have certain conditions and have not been vaccinated.  Pneumococcal polysaccharide (PPSV23) vaccine. You may need one or two  doses if you smoke cigarettes or if you have certain conditions. Talk to your health care provider about which screenings and vaccines you need and how often you need them. This information is not intended to replace advice given to you by your health care provider. Make sure you discuss any questions you have with your health care provider. Document Released: 10/14/2015 Document Revised: 06/06/2016 Document Reviewed: 07/19/2015 Elsevier Interactive Patient Education  2017 [place] Prevention in the Home Falls can cause injuries. They can happen to people of all ages. There are many things you can do to make your home safe and to help prevent falls. What can I do on the outside of my home?  Regularly fix the edges of walkways and driveways and fix any cracks.  Remove anything that might make you trip as you walk through a door, such as a raised step or threshold.  Trim any bushes or trees on the path to your home.  Use bright outdoor lighting.  Clear any walking paths of anything that might make someone trip, such as rocks or tools.  Regularly check to see if handrails are loose or broken. Make sure that both sides of any steps have handrails.  Any raised decks and porches should have guardrails on the edges.  Have any leaves, snow, or ice cleared regularly.  Use sand or salt on walking paths during winter.  Clean up any spills in your garage right away. This includes oil or grease spills. What can I do in the bathroom?  Use night lights.  Install grab bars by the toilet and in the tub and shower. Do not use towel bars as grab bars.  Use non-skid mats or decals in the tub or shower.  If you need to sit down in the shower, use a plastic, non-slip stool.  Keep the floor dry. Clean up any water that spills on the floor as soon as it happens.  Remove soap buildup in the tub or shower regularly.  Attach bath mats securely with double-sided non-slip rug tape.  Do  not have throw rugs and other things on the floor that can make you trip. What can I do in the bedroom?  Use night lights.  Make sure that you have a light by your bed that is easy to reach.  Do not use any sheets or blankets that are too big for your bed. They should not hang down onto the floor.  Have a firm chair that has side arms. You can use this for support while you get dressed.  Do not have throw rugs and other things on the floor that can make you trip. What can I do in the kitchen?  Clean up any spills right away.  Avoid walking on wet floors.  Keep items that you use a lot in easy-to-reach places.  If you need to reach something above you, use a strong step stool that has a grab bar.  Keep electrical cords out of the way.  Do not use floor polish or wax that makes floors slippery. If you must use wax, use non-skid floor wax.  Do not have throw rugs and other things on the floor that can make you trip. What can I do with my stairs?  Do not leave any items on the  stairs.  Make sure that there are handrails on both sides of the stairs and use them. Fix handrails that are broken or loose. Make sure that handrails are as long as the stairways.  Check any carpeting to make sure that it is firmly attached to the stairs. Fix any carpet that is loose or worn.  Avoid having throw rugs at the top or bottom of the stairs. If you do have throw rugs, attach them to the floor with carpet tape.  Make sure that you have a light switch at the top of the stairs and the bottom of the stairs. If you do not have them, ask someone to add them for you. What else can I do to help prevent falls?  Wear shoes that:  Do not have high heels.  Have rubber bottoms.  Are comfortable and fit you well.  Are closed at the toe. Do not wear sandals.  If you use a stepladder:  Make sure that it is fully opened. Do not climb a closed stepladder.  Make sure that both sides of the stepladder  are locked into place.  Ask someone to hold it for you, if possible.  Clearly mark and make sure that you can see:  Any grab bars or handrails.  First and last steps.  Where the edge of each step is.  Use tools that help you move around (mobility aids) if they are needed. These include:  Canes.  Walkers.  Scooters.  Crutches.  Turn on the lights when you go into a dark area. Replace any light bulbs as soon as they burn out.  Set up your furniture so you have a clear path. Avoid moving your furniture around.  If any of your floors are uneven, fix them.  If there are any pets around you, be aware of where they are.  Review your medicines with your doctor. Some medicines can make you feel dizzy. This can increase your chance of falling. Ask your doctor what other things that you can do to help prevent falls. This information is not intended to replace advice given to you by your health care provider. Make sure you discuss any questions you have with your health care provider. Document Released: 07/14/2009 Document Revised: 02/23/2016 Document Reviewed: 10/22/2014 Elsevier Interactive Patient Education  2017 [employer].

## 2019-04-20 ENCOUNTER — Encounter: Payer: Self-pay | Admitting: Student in an Organized Health Care Education/Training Program

## 2019-04-20 NOTE — Progress Notes (Signed)
John Garrett only uses [facility] for his pain medication.  All other medications should be sent to [facility] which is in his pharmacy profile.

## 2019-04-21 ENCOUNTER — Other Ambulatory Visit: Payer: Self-pay

## 2019-04-21 ENCOUNTER — Encounter: Payer: Self-pay | Admitting: Student in an Organized Health Care Education/Training Program

## 2019-04-21 ENCOUNTER — Telehealth: Payer: Self-pay | Admitting: Student in an Organized Health Care Education/Training Program

## 2019-04-21 ENCOUNTER — Ambulatory Visit
Payer: Medicare PPO | Attending: Nurse Practitioner | Admitting: Student in an Organized Health Care Education/Training Program

## 2019-04-21 DIAGNOSIS — M79604 Pain in right leg: Secondary | ICD-10-CM | POA: Diagnosis not present

## 2019-04-21 DIAGNOSIS — M5481 Occipital neuralgia: Secondary | ICD-10-CM

## 2019-04-21 DIAGNOSIS — M5416 Radiculopathy, lumbar region: Secondary | ICD-10-CM | POA: Diagnosis not present

## 2019-04-21 DIAGNOSIS — Z79891 Long term (current) use of opiate analgesic: Secondary | ICD-10-CM

## 2019-04-21 DIAGNOSIS — M5136 Other intervertebral disc degeneration, lumbar region: Secondary | ICD-10-CM

## 2019-04-21 DIAGNOSIS — G894 Chronic pain syndrome: Secondary | ICD-10-CM | POA: Diagnosis not present

## 2019-04-21 DIAGNOSIS — G8929 Other chronic pain: Secondary | ICD-10-CM

## 2019-04-21 DIAGNOSIS — M5412 Radiculopathy, cervical region: Secondary | ICD-10-CM

## 2019-04-21 DIAGNOSIS — M47816 Spondylosis without myelopathy or radiculopathy, lumbar region: Secondary | ICD-10-CM

## 2019-04-21 DIAGNOSIS — M51369 Other intervertebral disc degeneration, lumbar region without mention of lumbar back pain or lower extremity pain: Secondary | ICD-10-CM

## 2019-04-21 DIAGNOSIS — M79605 Pain in left leg: Secondary | ICD-10-CM

## 2019-04-21 DIAGNOSIS — G4762 Sleep related leg cramps: Secondary | ICD-10-CM

## 2019-04-21 MED ORDER — OXYCODONE-ACETAMINOPHEN 5-325 MG PO TABS: 1.0000 | ORAL_TABLET | ORAL | 0 refills | Status: DC | PRN

## 2019-04-21 MED ORDER — TIZANIDINE HCL 2 MG PO TABS: 2.0000 mg | ORAL_TABLET | Freq: Three times a day (TID) | ORAL | 2 refills | Status: AC

## 2019-04-21 NOTE — Telephone Encounter (Signed)
Patient called stating he needs tizanidine called in to pharmacy please

## 2019-04-21 NOTE — Progress Notes (Addendum)
Pain Management Virtual Encounter Note - Virtual Visit via Telephone Telehealth (real-time audio visits between healthcare provider and patient).   Patient's Phone No. & Preferred Pharmacy:  [phone] (home); [phone] (mobile); (Preferred) [phone] [email or web address] [place], [place] AT [facility] [address] [place] [address] Phone: [phone] Fax: [facility] [place], [place] [address] Phone: [phone] Fax: [place], [place] [address] Phone: [phone] Fax: [phone]    Pre-screening note:  Our staff contacted John Garrett and offered him an "in person", "face-to-face" appointment versus a telephone encounter. He indicated preferring the telephone encounter, at this time.   Reason for Virtual Visit: COVID-19*  Social distancing based on CDC and AMA recommendations.   I contacted Buren Kos on 04/21/2019 via telephone.      I clearly identified myself as John Santa, MD. I verified that I was speaking with the correct person using two identifiers (Name: John Garrett, and date of birth: 10/29/1960).  Advanced Informed Consent I sought verbal advanced consent from [facility] for virtual visit interactions. I informed John Garrett of possible security and privacy concerns, risks, and limitations associated with providing "not-in-person" medical evaluation and management services. I also informed John Garrett of the availability of "in-person" appointments. Finally, I informed him that there would be a charge for the virtual visit and that he could be  personally, fully or partially, financially responsible for it. John Garrett expressed understanding and agreed to proceed.   Historic Elements   John Garrett is a 58 y.o. year old,  male patient evaluated today after his last encounter by our practice on 01/20/2019. [name]  has a past medical history of Anxiety, Chronic back pain, Chronic pain, Depression, Hyperlipidemia, and Prediabetes (01/08/2018). He also  has a past surgical history that includes Cataract extraction (Right, 2014) and Nasal sinus surgery (2004). [name] has a current medication list which includes the following prescription(s): bupropion, buspirone, citalopram, hydroxyzine, ibuprofen, oxycodone-acetaminophen, oxycodone-acetaminophen, oxycodone-acetaminophen, rosuvastatin, sildenafil, and tizanidine. He  reports that he has been smoking cigarettes. He has a 11.00 pack-year smoking history. He quit smokeless tobacco use about 19 years ago.  His smokeless tobacco use included chew and snuff. He reports current alcohol use of about 16.0 standard drinks of alcohol per week. He reports that he does not use drugs. [name] has No Known Allergies.   HPI  Today, he is being contacted for medication management.   No change in medical history since last visit.  Patient's pain is at baseline.  Patient continues multimodal pain regimen as prescribed.  States that it provides pain relief and improvement in functional status.   Pharmacotherapy Assessment  Analgesic: 03/27/2019  2   01/20/2019  Oxycodone-Acetaminophen 5-325  180.00 30 Cr Kin   88525   Wal (0252)   0  45.00 MME  Comm Ins   [place]    Monitoring: Pharmacotherapy: No side-effects or adverse reactions reported. [place] PMP: PDMP reviewed during this encounter.       Compliance: No problems identified. Effectiveness: Clinically acceptable. Plan: Refer to "POC".  Pertinent Labs   SAFETY SCREENING Profile Lab Results  Component Value Date   HIV NON-REACTIVE 02/20/2019   Renal Function Lab Results  Component Value Date   BUN 15 02/20/2019   CREATININE 0.87  57/32/2025   BCR NOT APPLICABLE 42/70/6237   GFRAA 111 02/20/2019   GFRNONAA 96 02/20/2019    Hepatic Function Lab Results  Component Value Date   AST 25 02/20/2019   ALT 37 02/20/2019   UDS Summary  Date Value Ref Range Status  07/23/2018 FINAL  Final    Comment:    ==================================================================== TOXASSURE SELECT 13 (MW) ==================================================================== Test                             Result       Flag       Units Drug Present and Declared for Prescription Verification   Oxycodone                      1769         EXPECTED   ng/mg creat   Oxymorphone                    78           EXPECTED   ng/mg creat   Noroxycodone                   1707         EXPECTED   ng/mg creat    Sources of oxycodone include scheduled prescription medications.    Oxymorphone and noroxycodone are expected metabolites of    oxycodone. Oxymorphone is also available as a scheduled    prescription medication. ==================================================================== Test                      Result    Flag   Units      Ref Range   Creatinine              142              mg/dL      >=20 ==================================================================== Declared Medications:  The flagging and interpretation on this report are based on the  following declared medications.  Unexpected results may arise from  inaccuracies in the declared medications.  **Note: The testing scope of this panel includes these medications:  Oxycodone (Oxycodone Acetaminophen)  **Note: The testing scope of this panel does not include following  reported medications:  Acetaminophen (Oxycodone Acetaminophen)  Bupropion  Buspirone  Citalopram  Ibuprofen  Rosuvastatin  Tizanidine ==================================================================== For clinical consultation, please call [phone]. ====================================================================    Note: Above Lab results reviewed.  Recent imaging  DG  C-Arm 1-60 Min-No Report Fluoroscopy was utilized by the requesting physician.  No radiographic  interpretation.   Assessment  The primary encounter diagnosis was Nocturnal leg cramps. Diagnoses of Chronic pain syndrome, Lumbar spondylosis, DDD (degenerative disc disease), lumbar, Chronic lower extremity pain (Secondary Area of Pain) (Bilateral) (R>L), Cervico-occipital neuralgia (Fourth Area of Pain) (Right), Cervical radiculitis (C7/C8) (Right), Chronic lumbar radiculopathy (S1) (Right), and Long term prescription opiate use were also pertinent to this visit.  Plan of Care  I have changed Almer Gallentine's oxyCODONE-acetaminophen. I am also having him start on oxyCODONE-acetaminophen and oxyCODONE-acetaminophen. Additionally, I am having him maintain his buPROPion, citalopram, ibuprofen, rosuvastatin, tiZANidine, sildenafil, busPIRone, and hydrOXYzine.  General Recommendations: The pain condition that the patient suffers from is best treated with a multidisciplinary approach that involves an increase in physical activity to prevent de-conditioning and worsening of the pain cycle, as well as psychological counseling (formal and/or informal) to address the co-morbid  psychological affects of pain. Treatment will often involve judicious use of pain medications and interventional procedures to decrease the pain, allowing the patient to participate in the physical activity that will ultimately produce long-lasting pain reductions. The goal of the multidisciplinary approach is to return the patient to a higher level of overall function and to restore their ability to perform activities of daily living.  Pharmacotherapy (Medications Ordered): Meds ordered this encounter  Medications  . oxyCODONE-acetaminophen (PERCOCET) 5-325 MG tablet    Sig: Take 1 tablet by mouth every 4 (four) hours as needed for severe pain.    Dispense:  180 tablet    Refill:  0    Do not add this medication to the electronic  "Automatic Refill" notification system. Patient may have prescription filled one day early if pharmacy is closed on scheduled refill date.  Marland Kitchen oxyCODONE-acetaminophen (PERCOCET) 5-325 MG tablet    Sig: Take 1 tablet by mouth every 4 (four) hours as needed for severe pain.    Dispense:  180 tablet    Refill:  0    Do not add this medication to the electronic "Automatic Refill" notification system. Patient may have prescription filled one day early if pharmacy is closed on scheduled refill date.  Marland Kitchen oxyCODONE-acetaminophen (PERCOCET) 5-325 MG tablet    Sig: Take 1 tablet by mouth every 4 (four) hours as needed for severe pain.    Dispense:  180 tablet    Refill:  0    Do not add this medication to the electronic "Automatic Refill" notification system. Patient may have prescription filled one day early if pharmacy is closed on scheduled refill date.   Orders:  No orders of the defined types were placed in this encounter.  Follow-up plan:   Return in about 3 months (around 07/22/2019) for Medication Management (get UDS).    Recent Visits No visits were found meeting these conditions.  Showing recent visits within past 90 days and meeting all other requirements   Today's Visits Date Type Provider Dept  04/21/19 Office Visit John Santa, MD [facility]  Showing today's visits and meeting all other requirements   Future Appointments No visits were found meeting these conditions.  Showing future appointments within next 90 days and meeting all other requirements   I discussed the assessment and treatment plan with the patient. The patient was provided an opportunity to ask questions and all were answered. The patient agreed with the plan and demonstrated an understanding of the instructions.  Patient advised to call back or seek an in-person evaluation if the symptoms or condition worsens.  Time note: 25 mins  Note by: John Santa, MD Date: 04/21/2019; Time: 11:10 AM  Note:  This dictation was prepared with Dragon dictation. Any transcriptional errors that may result from this process are unintentional.  Disclaimer:  * Given the special circumstances of the COVID-19 pandemic, the federal government has announced that the Office for Civil Rights (OCR) will exercise its enforcement discretion and will not impose penalties on physicians using telehealth in the event of noncompliance with regulatory requirements under the [place] (HIPAA) in connection with the good faith provision of telehealth during the KXFGH-82 national public health emergency. ([place])

## 2019-04-28 ENCOUNTER — Other Ambulatory Visit: Payer: Self-pay | Admitting: Family Medicine

## 2019-04-28 DIAGNOSIS — N528 Other male erectile dysfunction: Secondary | ICD-10-CM

## 2019-04-28 MED ORDER — SILDENAFIL CITRATE 100 MG PO TABS: 50.0000 mg | ORAL_TABLET | Freq: Every day | ORAL | 0 refills | Status: DC | PRN

## 2019-04-28 NOTE — Telephone Encounter (Signed)
Pt would like a refill on generic Viagra to be sent to [facility]

## 2019-05-25 ENCOUNTER — Other Ambulatory Visit: Payer: Self-pay

## 2019-05-25 ENCOUNTER — Ambulatory Visit (INDEPENDENT_AMBULATORY_CARE_PROVIDER_SITE_OTHER): Payer: Medicare PPO | Admitting: Family Medicine

## 2019-05-25 ENCOUNTER — Encounter: Payer: Self-pay | Admitting: Family Medicine

## 2019-05-25 DIAGNOSIS — E8881 Metabolic syndrome: Secondary | ICD-10-CM

## 2019-05-25 DIAGNOSIS — F1120 Opioid dependence, uncomplicated: Secondary | ICD-10-CM

## 2019-05-25 DIAGNOSIS — M5416 Radiculopathy, lumbar region: Secondary | ICD-10-CM | POA: Diagnosis not present

## 2019-05-25 DIAGNOSIS — F411 Generalized anxiety disorder: Secondary | ICD-10-CM | POA: Diagnosis not present

## 2019-05-25 DIAGNOSIS — R739 Hyperglycemia, unspecified: Secondary | ICD-10-CM | POA: Diagnosis not present

## 2019-05-25 DIAGNOSIS — F325 Major depressive disorder, single episode, in full remission: Secondary | ICD-10-CM | POA: Diagnosis not present

## 2019-05-25 MED ORDER — HYDROXYZINE HCL 25 MG PO TABS: 25.0000 mg | ORAL_TABLET | Freq: Three times a day (TID) | ORAL | 0 refills | Status: DC | PRN

## 2019-05-25 MED ORDER — IBUPROFEN 600 MG PO TABS: 600.0000 mg | ORAL_TABLET | Freq: Every day | ORAL | 0 refills | Status: DC
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
## 2019-07-13 ENCOUNTER — Encounter: Payer: Self-pay | Admitting: Student in an Organized Health Care Education/Training Program

## 2019-07-13 ENCOUNTER — Telehealth: Payer: Self-pay | Admitting: Student in an Organized Health Care Education/Training Program

## 2019-07-13 ENCOUNTER — Telehealth: Payer: Self-pay | Admitting: *Deleted

## 2019-07-13 NOTE — Telephone Encounter (Signed)
Attempted to call for pre appointment review of meds/allergies. Message left. 

## 2019-07-13 NOTE — Telephone Encounter (Signed)
Returned Nurse call to update chart for VV 07-14-19

## 2019-07-14 ENCOUNTER — Ambulatory Visit
Payer: Medicare PPO | Attending: Student in an Organized Health Care Education/Training Program | Admitting: Student in an Organized Health Care Education/Training Program

## 2019-07-14 ENCOUNTER — Encounter: Payer: Self-pay | Admitting: Student in an Organized Health Care Education/Training Program

## 2019-07-14 ENCOUNTER — Other Ambulatory Visit: Payer: Self-pay

## 2019-07-14 DIAGNOSIS — M25512 Pain in left shoulder: Secondary | ICD-10-CM | POA: Diagnosis not present

## 2019-07-14 DIAGNOSIS — M5136 Other intervertebral disc degeneration, lumbar region: Secondary | ICD-10-CM

## 2019-07-14 DIAGNOSIS — M5412 Radiculopathy, cervical region: Secondary | ICD-10-CM

## 2019-07-14 DIAGNOSIS — G894 Chronic pain syndrome: Secondary | ICD-10-CM | POA: Diagnosis not present

## 2019-07-14 DIAGNOSIS — M5410 Radiculopathy, site unspecified: Secondary | ICD-10-CM

## 2019-07-14 DIAGNOSIS — M5416 Radiculopathy, lumbar region: Secondary | ICD-10-CM | POA: Diagnosis not present

## 2019-07-14 DIAGNOSIS — M47817 Spondylosis without myelopathy or radiculopathy, lumbosacral region: Secondary | ICD-10-CM

## 2019-07-14 DIAGNOSIS — M79604 Pain in right leg: Secondary | ICD-10-CM | POA: Diagnosis not present

## 2019-07-14 DIAGNOSIS — M4802 Spinal stenosis, cervical region: Secondary | ICD-10-CM | POA: Diagnosis not present

## 2019-07-14 DIAGNOSIS — M47816 Spondylosis without myelopathy or radiculopathy, lumbar region: Secondary | ICD-10-CM | POA: Diagnosis not present

## 2019-07-14 DIAGNOSIS — Z79891 Long term (current) use of opiate analgesic: Secondary | ICD-10-CM

## 2019-07-14 DIAGNOSIS — M79605 Pain in left leg: Secondary | ICD-10-CM

## 2019-07-14 DIAGNOSIS — G8929 Other chronic pain: Secondary | ICD-10-CM

## 2019-07-14 DIAGNOSIS — M4726 Other spondylosis with radiculopathy, lumbar region: Secondary | ICD-10-CM

## 2019-07-14 MED ORDER — OXYCODONE-ACETAMINOPHEN 5-325 MG PO TABS: 1.0000 | ORAL_TABLET | ORAL | 0 refills | Status: DC | PRN

## 2019-07-14 NOTE — Progress Notes (Signed)
Pain Management Virtual Encounter Note - Virtual Visit via [place] (real-time audio visits between healthcare provider and patient).   Patient's Phone No. & Preferred Pharmacy:  [phone] (home); [phone] (mobile); (Preferred) [phone] [email or web address]  [facility] [place], [address] AT [address] [place] [address] Phone: [phone] Fax: [phone]  [facility] [place], [address] [place] [address] Phone: [phone] Fax: [address], [address] [place] [address] Phone: [phone] Fax: [phone] On 1024 and then I will do another appointment prior to 1124 assuming that you have done your urine screen at that time okay   Pre-screening note:  Our staff contacted John Garrett and offered him an "in person", "face-to-face" appointment versus a telephone encounter. He indicated preferring John telephone encounter, at this time.   Reason for Virtual Visit: COVID-19*  Social distancing based on CDC and AMA recommendations.   I contacted Buren Kos on 07/14/2019 via video conference.      I clearly identified myself as [name], MD. I verified that I was speaking with John correct person using two identifiers (Name: John Garrett, and date of birth: Dec 19, 1960).  Advanced Informed Consent I sought verbal advanced consent from John Garrett for virtual visit interactions. I informed John Garrett of possible security and privacy concerns, risks, and limitations associated with providing "not-in-person" medical evaluation and management services. I also informed John Garrett of John availability of "in-person" appointments. Finally, I informed him that there would be a charge for John virtual visit and that he could be  personally, fully or partially, financially  responsible for it. John Garrett expressed understanding and agreed to proceed.   Historic Elements   John Garrett is a 58 y.o. year old, male patient evaluated today after his last encounter by our practice on 07/13/2019. John Garrett  has a past medical history of Anxiety, Chronic back pain, Chronic pain, Depression, Hyperlipidemia, and Prediabetes (01/08/2018). He also  has a past surgical history that includes Cataract extraction (Right, 2014) and Nasal sinus surgery (2004). Mr. Ahlers has a current medication list which includes John following prescription(s): bupropion, buspirone, citalopram, hydroxyzine, ibuprofen, oxycodone-acetaminophen, rosuvastatin, sildenafil, and tizanidine. He  reports that he has been smoking cigarettes. He has a 11.00 pack-year smoking history. He quit smokeless tobacco use about 19 years ago.  His smokeless tobacco use included chew and snuff. He reports current alcohol use of about 16.0 standard drinks of alcohol per week. He reports that he does not use drugs. John Garrett has No Known Allergies.   HPI  Today, he is being contacted for medication management.   No change in medical history since last visit.  Patient's pain is at baseline.  Patient continues multimodal pain regimen as prescribed.  States that it provides pain relief and improvement in functional status.  We will need to complete annual urine toxicology screen for medication compliance and monitoring.  Will provide patient with 1 more prescription until we are able to complete his UDS which John patient states he will do either this week or next week.  Pharmacotherapy Assessment  Analgesic:  06/26/2019  2   04/21/2019  Oxycodone-Acetaminophen 5-325  180.00  30 Bi Lat   [id number]   Wal (0252)   0  45.00 MME  Comm Ins   [facility]    Monitoring: Pharmacotherapy:  No side-effects or adverse reactions reported. [place] PMP: PDMP reviewed during this encounter.       Compliance: No problems identified. Effectiveness: Clinically  acceptable. Plan: Refer to "POC".  UDS:  Summary  Date Value Ref Range Status  07/23/2018 FINAL  Final    Comment:    ==================================================================== TOXASSURE SELECT 13 (MW) ==================================================================== Test                             Result       Flag       Units Drug Present and Declared for Prescription Verification   Oxycodone                      1769         EXPECTED   ng/mg creat   Oxymorphone                    78           EXPECTED   ng/mg creat   Noroxycodone                   1707         EXPECTED   ng/mg creat    Sources of oxycodone include scheduled prescription medications.    Oxymorphone and noroxycodone are expected metabolites of    oxycodone. Oxymorphone is also available as a scheduled    prescription medication. ==================================================================== Test                      Result    Flag   Units      Ref Range   Creatinine              142              mg/dL      >=20 ==================================================================== Declared Medications:  John flagging and interpretation on this report are based on John  following declared medications.  Unexpected results may arise from  inaccuracies in John declared medications.  **Note: John testing scope of this panel includes these medications:  Oxycodone (Oxycodone Acetaminophen)  **Note: John testing scope of this panel does not include following  reported medications:  Acetaminophen (Oxycodone Acetaminophen)  Bupropion  Buspirone  Citalopram  Ibuprofen  Rosuvastatin  Tizanidine ==================================================================== For clinical consultation, please call [phone]. ====================================================================    Laboratory Chemistry Profile (12 mo)  Renal: 02/20/2019: BUN 15; BUN/Creatinine Ratio NOT APPLICABLE; Creat [id number]  Lab  Results  Component Value Date   GFRAA 111 02/20/2019   GFRNONAA 96 02/20/2019   Hepatic: No results found for requested labs within last 8760 hours. Lab Results  Component Value Date   AST 25 02/20/2019   ALT 37 02/20/2019   Other: No results found for requested labs within last 8760 hours. Note: Above Lab results reviewed.   Assessment  John primary encounter diagnosis was Lumbar spondylosis. Diagnoses of Chronic pain syndrome, DDD (degenerative disc disease), lumbar, Chronic lower extremity pain (Secondary Area of Pain) (Bilateral) (R>L), Cervical radiculitis (C7/C8) (Right), Chronic lumbar radiculopathy (S1) (Right), Long term prescription opiate use, Cervical foraminal stenosis (C3-4, C5-6) (Left), Chronic shoulder pain (Fifth Area of Pain) (Left), Spondylosis without myelopathy or radiculopathy, lumbosacral region, Osteoarthritis of spine with radiculopathy, lumbar region (S1) (Right), Chronic radicular pain of lower extremity (S1 dermatome) (Right), and Lumbar degenerative disc disease were also pertinent to this visit.  Plan of  Care  I am having John Garrett maintain his buPROPion, citalopram, rosuvastatin, busPIRone, tiZANidine, sildenafil, ibuprofen, hydrOXYzine, and oxyCODONE-acetaminophen.  Pharmacotherapy (Medications Ordered): Meds ordered this encounter  Medications  . oxyCODONE-acetaminophen (PERCOCET) 5-325 MG tablet    Sig: Take 1 tablet by mouth every 4 (four) hours as needed for severe pain.    Dispense:  180 tablet    Refill:  0    Do not add this medication to John electronic "Automatic Refill" notification system. Patient may have prescription filled one day early if pharmacy is closed on scheduled refill date.   Orders:  Orders Placed This Encounter  Procedures  . ToxASSURE Select 13 (MW), Urine    Volume: 30 ml(s). Minimum 3 ml of urine is needed. Document temperature of fresh sample. Indications: Long term (current) use of opiate analgesic [id number])    Follow-up plan:   Return in about 4 weeks (around 08/11/2019) for Medication Management, virtual.   Recent Visits Date Type Provider Dept  04/21/19 Office Visit [name], MD [facility]  Showing recent visits within past 90 days and meeting all other requirements   Today's Visits Date Type Provider Dept  07/14/19 Office Visit [name], MD [facility]  Showing today's visits and meeting all other requirements   Future Appointments No visits were found meeting these conditions.  Showing future appointments within next 90 days and meeting all other requirements   I discussed John assessment and treatment plan with John patient. John patient was provided an opportunity to ask questions and all were answered. John patient agreed with John plan and demonstrated an understanding of John instructions.  Patient advised to call back or seek an in-person evaluation if John symptoms or condition worsens.  Total duration of non-face-to-face encounter: 64minutes.  Note by: [name], MD Date: 07/14/2019; Time: 10:46 AM  Note: This dictation was prepared with Dragon dictation. Any transcriptional errors that may result from this process are unintentional.  Disclaimer:  * Given John special circumstances of John COVID-19 pandemic, John federal government has announced that John Office for Civil Rights (OCR) will exercise its enforcement discretion and will not impose penalties on physicians using telehealth in John event of noncompliance with regulatory requirements under John [place] (HIPAA) in connection with John good faith provision of telehealth during John [id number] national public health emergency. ([place])

## 2019-07-22 DIAGNOSIS — G894 Chronic pain syndrome: Secondary | ICD-10-CM | POA: Diagnosis not present

## 2019-07-25 LAB — TOXASSURE SELECT 13 (MW), URINE

## 2019-07-27 ENCOUNTER — Telehealth: Payer: Self-pay | Admitting: Student in an Organized Health Care Education/Training Program

## 2019-07-27 NOTE — Telephone Encounter (Signed)
I called the patient at 3:20 PM and informed him of his urine toxicology results.  Patient is not a diabetic and his ethyl alcohol level was 0.184 in his urine.  I would not expect it to be this high.  When I called the patient, he states that he had drank wine the night before and that is why it was probably high.  I expressed my concerns regarding him being on opioid therapy as well as utilizing alcohol and being a smoker.  I informed him that I would not be able to continue his chronic opioid medications given his urine toxicology results which is a violation of our pain contract.  Patient endorsed understanding.  I encouraged the patient to wean his oxycodone intake and decrease by 1 tablet daily until he is off.

## 2019-07-28 ENCOUNTER — Other Ambulatory Visit: Payer: Self-pay

## 2019-07-28 ENCOUNTER — Ambulatory Visit (INDEPENDENT_AMBULATORY_CARE_PROVIDER_SITE_OTHER): Payer: Medicare PPO

## 2019-07-28 DIAGNOSIS — Z23 Encounter for immunization: Secondary | ICD-10-CM

## 2019-08-06 ENCOUNTER — Ambulatory Visit: Payer: Medicare PPO | Admitting: Student in an Organized Health Care Education/Training Program

## 2019-08-26 ENCOUNTER — Encounter: Payer: Self-pay | Admitting: Family Medicine

## 2019-08-26 ENCOUNTER — Ambulatory Visit (INDEPENDENT_AMBULATORY_CARE_PROVIDER_SITE_OTHER): Payer: Medicare PPO | Admitting: Family Medicine

## 2019-08-26 VITALS — Ht 68.0 in | Wt 215.0 lb

## 2019-08-26 DIAGNOSIS — F325 Major depressive disorder, single episode, in full remission: Secondary | ICD-10-CM

## 2019-08-26 DIAGNOSIS — R739 Hyperglycemia, unspecified: Secondary | ICD-10-CM | POA: Diagnosis not present

## 2019-08-26 DIAGNOSIS — F411 Generalized anxiety disorder: Secondary | ICD-10-CM

## 2019-08-26 DIAGNOSIS — E8881 Metabolic syndrome: Secondary | ICD-10-CM | POA: Diagnosis not present

## 2019-08-26 DIAGNOSIS — E782 Mixed hyperlipidemia: Secondary | ICD-10-CM

## 2019-08-26 DIAGNOSIS — N528 Other male erectile dysfunction: Secondary | ICD-10-CM

## 2019-08-26 DIAGNOSIS — M5416 Radiculopathy, lumbar region: Secondary | ICD-10-CM | POA: Diagnosis not present

## 2019-08-26 MED ORDER — CITALOPRAM HYDROBROMIDE 40 MG PO TABS: 40.0000 mg | ORAL_TABLET | Freq: Every day | ORAL | 1 refills | Status: DC

## 2019-08-26 MED ORDER — SILDENAFIL CITRATE 100 MG PO TABS: 50.0000 mg | ORAL_TABLET | Freq: Every day | ORAL | 0 refills | Status: DC | PRN

## 2019-08-26 MED ORDER — BUPROPION HCL ER (XL) 300 MG PO TB24: 300.0000 mg | ORAL_TABLET | Freq: Every day | ORAL | 1 refills | Status: DC

## 2019-08-26 MED ORDER — HYDROXYZINE HCL 25 MG PO TABS: 25.0000 mg | ORAL_TABLET | Freq: Two times a day (BID) | ORAL | 0 refills | Status: DC | PRN

## 2019-08-26 MED ORDER — ROSUVASTATIN CALCIUM 40 MG PO TABS: 40.0000 mg | ORAL_TABLET | Freq: Every day | ORAL | 1 refills | Status: DC

## 2019-08-26 MED ORDER — BUSPIRONE HCL 30 MG PO TABS: 30.0000 mg | ORAL_TABLET | Freq: Every day | ORAL | 1 refills | Status: DC
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
## 2019-11-30 ENCOUNTER — Ambulatory Visit (INDEPENDENT_AMBULATORY_CARE_PROVIDER_SITE_OTHER): Payer: Medicare PPO | Admitting: Family Medicine

## 2019-11-30 ENCOUNTER — Encounter: Payer: Self-pay | Admitting: Family Medicine

## 2019-11-30 ENCOUNTER — Other Ambulatory Visit: Payer: Self-pay

## 2019-11-30 DIAGNOSIS — M5416 Radiculopathy, lumbar region: Secondary | ICD-10-CM | POA: Diagnosis not present

## 2019-11-30 DIAGNOSIS — R739 Hyperglycemia, unspecified: Secondary | ICD-10-CM

## 2019-11-30 DIAGNOSIS — E8881 Metabolic syndrome: Secondary | ICD-10-CM

## 2019-11-30 DIAGNOSIS — F331 Major depressive disorder, recurrent, moderate: Secondary | ICD-10-CM

## 2019-11-30 MED ORDER — TIZANIDINE HCL 4 MG PO TABS: 4.0000 mg | ORAL_TABLET | Freq: Four times a day (QID) | ORAL | 0 refills | Status: DC | PRN

## 2019-11-30 MED ORDER — IBUPROFEN 600 MG PO TABS: 600.0000 mg | ORAL_TABLET | Freq: Two times a day (BID) | ORAL | 0 refills | Status: DC | PRN
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
## 2019-12-23 DIAGNOSIS — G8921 Chronic pain due to trauma: Secondary | ICD-10-CM | POA: Diagnosis not present

## 2019-12-23 DIAGNOSIS — Z79891 Long term (current) use of opiate analgesic: Secondary | ICD-10-CM | POA: Diagnosis not present

## 2019-12-23 DIAGNOSIS — M79606 Pain in leg, unspecified: Secondary | ICD-10-CM | POA: Diagnosis not present

## 2019-12-23 DIAGNOSIS — M5136 Other intervertebral disc degeneration, lumbar region: Secondary | ICD-10-CM | POA: Diagnosis not present

## 2019-12-23 DIAGNOSIS — F17210 Nicotine dependence, cigarettes, uncomplicated: Secondary | ICD-10-CM | POA: Diagnosis not present

## 2019-12-23 DIAGNOSIS — G894 Chronic pain syndrome: Secondary | ICD-10-CM | POA: Diagnosis not present

## 2019-12-23 DIAGNOSIS — M545 Low back pain: Secondary | ICD-10-CM | POA: Diagnosis not present

## 2019-12-23 DIAGNOSIS — Z79899 Other long term (current) drug therapy: Secondary | ICD-10-CM | POA: Diagnosis not present

## 2019-12-23 DIAGNOSIS — M47816 Spondylosis without myelopathy or radiculopathy, lumbar region: Secondary | ICD-10-CM | POA: Diagnosis not present

## 2019-12-28 DIAGNOSIS — M5136 Other intervertebral disc degeneration, lumbar region: Secondary | ICD-10-CM | POA: Diagnosis not present

## 2019-12-28 DIAGNOSIS — Z79891 Long term (current) use of opiate analgesic: Secondary | ICD-10-CM | POA: Diagnosis not present

## 2019-12-28 DIAGNOSIS — F1120 Opioid dependence, uncomplicated: Secondary | ICD-10-CM | POA: Diagnosis not present

## 2019-12-28 DIAGNOSIS — M545 Low back pain: Secondary | ICD-10-CM | POA: Diagnosis not present

## 2019-12-28 DIAGNOSIS — G894 Chronic pain syndrome: Secondary | ICD-10-CM | POA: Diagnosis not present

## 2019-12-28 DIAGNOSIS — Z79899 Other long term (current) drug therapy: Secondary | ICD-10-CM | POA: Diagnosis not present

## 2019-12-28 DIAGNOSIS — G8921 Chronic pain due to trauma: Secondary | ICD-10-CM | POA: Diagnosis not present

## 2019-12-28 DIAGNOSIS — M47816 Spondylosis without myelopathy or radiculopathy, lumbar region: Secondary | ICD-10-CM | POA: Diagnosis not present

## 2019-12-28 DIAGNOSIS — M79606 Pain in leg, unspecified: Secondary | ICD-10-CM | POA: Diagnosis not present

## 2020-01-04 DIAGNOSIS — M545 Low back pain: Secondary | ICD-10-CM | POA: Diagnosis not present

## 2020-01-04 DIAGNOSIS — M5136 Other intervertebral disc degeneration, lumbar region: Secondary | ICD-10-CM | POA: Diagnosis not present

## 2020-01-04 DIAGNOSIS — M79606 Pain in leg, unspecified: Secondary | ICD-10-CM | POA: Diagnosis not present

## 2020-01-04 DIAGNOSIS — G8921 Chronic pain due to trauma: Secondary | ICD-10-CM | POA: Diagnosis not present

## 2020-01-04 DIAGNOSIS — F1120 Opioid dependence, uncomplicated: Secondary | ICD-10-CM | POA: Diagnosis not present

## 2020-01-04 DIAGNOSIS — Z79891 Long term (current) use of opiate analgesic: Secondary | ICD-10-CM | POA: Diagnosis not present

## 2020-01-04 DIAGNOSIS — Z79899 Other long term (current) drug therapy: Secondary | ICD-10-CM | POA: Diagnosis not present

## 2020-01-04 DIAGNOSIS — G894 Chronic pain syndrome: Secondary | ICD-10-CM | POA: Diagnosis not present

## 2020-01-04 DIAGNOSIS — M47816 Spondylosis without myelopathy or radiculopathy, lumbar region: Secondary | ICD-10-CM | POA: Diagnosis not present

## 2020-01-11 DIAGNOSIS — M5136 Other intervertebral disc degeneration, lumbar region: Secondary | ICD-10-CM | POA: Diagnosis not present

## 2020-01-11 DIAGNOSIS — Z79899 Other long term (current) drug therapy: Secondary | ICD-10-CM | POA: Diagnosis not present

## 2020-01-11 DIAGNOSIS — Z79891 Long term (current) use of opiate analgesic: Secondary | ICD-10-CM | POA: Diagnosis not present

## 2020-01-11 DIAGNOSIS — M79606 Pain in leg, unspecified: Secondary | ICD-10-CM | POA: Diagnosis not present

## 2020-01-11 DIAGNOSIS — G894 Chronic pain syndrome: Secondary | ICD-10-CM | POA: Diagnosis not present

## 2020-01-11 DIAGNOSIS — M47816 Spondylosis without myelopathy or radiculopathy, lumbar region: Secondary | ICD-10-CM | POA: Diagnosis not present

## 2020-01-11 DIAGNOSIS — M545 Low back pain: Secondary | ICD-10-CM | POA: Diagnosis not present

## 2020-01-11 DIAGNOSIS — F1120 Opioid dependence, uncomplicated: Secondary | ICD-10-CM | POA: Diagnosis not present

## 2020-01-11 DIAGNOSIS — G8921 Chronic pain due to trauma: Secondary | ICD-10-CM | POA: Diagnosis not present

## 2020-01-25 DIAGNOSIS — M5136 Other intervertebral disc degeneration, lumbar region: Secondary | ICD-10-CM | POA: Diagnosis not present

## 2020-01-25 DIAGNOSIS — F1120 Opioid dependence, uncomplicated: Secondary | ICD-10-CM | POA: Diagnosis not present

## 2020-01-25 DIAGNOSIS — Z79899 Other long term (current) drug therapy: Secondary | ICD-10-CM | POA: Diagnosis not present

## 2020-01-25 DIAGNOSIS — G8921 Chronic pain due to trauma: Secondary | ICD-10-CM | POA: Diagnosis not present

## 2020-01-25 DIAGNOSIS — Z79891 Long term (current) use of opiate analgesic: Secondary | ICD-10-CM | POA: Diagnosis not present

## 2020-01-25 DIAGNOSIS — M47816 Spondylosis without myelopathy or radiculopathy, lumbar region: Secondary | ICD-10-CM | POA: Diagnosis not present

## 2020-01-25 DIAGNOSIS — M79606 Pain in leg, unspecified: Secondary | ICD-10-CM | POA: Diagnosis not present

## 2020-01-25 DIAGNOSIS — G894 Chronic pain syndrome: Secondary | ICD-10-CM | POA: Diagnosis not present

## 2020-01-25 DIAGNOSIS — M545 Low back pain: Secondary | ICD-10-CM | POA: Diagnosis not present

## 2020-02-08 ENCOUNTER — Other Ambulatory Visit: Payer: Self-pay

## 2020-02-08 ENCOUNTER — Emergency Department: Payer: Medicare PPO

## 2020-02-08 ENCOUNTER — Emergency Department
Admission: EM | Admit: 2020-02-08 | Discharge: 2020-02-08 | Disposition: A | Discharge status: Home / Self Care | Payer: Medicare PPO | Attending: Emergency Medicine | Admitting: Emergency Medicine

## 2020-02-08 ENCOUNTER — Encounter: Payer: Self-pay | Admitting: Emergency Medicine

## 2020-02-08 DIAGNOSIS — F17210 Nicotine dependence, cigarettes, uncomplicated: Secondary | ICD-10-CM | POA: Diagnosis not present

## 2020-02-08 DIAGNOSIS — W0110XA Fall on same level from slipping, tripping and stumbling with subsequent striking against unspecified object, initial encounter: Secondary | ICD-10-CM | POA: Diagnosis not present

## 2020-02-08 DIAGNOSIS — Y939 Activity, unspecified: Secondary | ICD-10-CM | POA: Diagnosis not present

## 2020-02-08 DIAGNOSIS — R52 Pain, unspecified: Secondary | ICD-10-CM

## 2020-02-08 DIAGNOSIS — Y999 Unspecified external cause status: Secondary | ICD-10-CM | POA: Insufficient documentation

## 2020-02-08 DIAGNOSIS — S59902A Unspecified injury of left elbow, initial encounter: Secondary | ICD-10-CM | POA: Diagnosis present

## 2020-02-08 DIAGNOSIS — S53125A Posterior dislocation of left ulnohumeral joint, initial encounter: Secondary | ICD-10-CM | POA: Diagnosis not present

## 2020-02-08 DIAGNOSIS — R7303 Prediabetes: Secondary | ICD-10-CM | POA: Insufficient documentation

## 2020-02-08 DIAGNOSIS — S53105A Unspecified dislocation of left ulnohumeral joint, initial encounter: Secondary | ICD-10-CM | POA: Insufficient documentation

## 2020-02-08 DIAGNOSIS — Y929 Unspecified place or not applicable: Secondary | ICD-10-CM | POA: Insufficient documentation

## 2020-02-08 MED ORDER — OXYCODONE-ACETAMINOPHEN 5-325 MG PO TABS: 1.0000 | ORAL_TABLET | Freq: Two times a day (BID) | ORAL | 0 refills | Status: DC | PRN

## 2020-02-08 MED ORDER — PROPOFOL 10 MG/ML IV BOLUS
INTRAVENOUS | Status: AC | PRN
  Administered 2020-02-08: 25 mg via INTRAVENOUS

## 2020-02-08 MED ORDER — PROPOFOL 10 MG/ML IV BOLUS
0.5000 mg/kg | Freq: Once | INTRAVENOUS | Status: AC
  Administered 2020-02-08: 51.1 mg via INTRAVENOUS
  Filled 2020-02-08: qty 20

## 2020-02-08 NOTE — Sedation Documentation (Signed)
Pt wanted daughter to sign consent

## 2020-02-08 NOTE — ED Provider Notes (Signed)
[facility] Emergency Department Provider Note       Time seen: ----------------------------------------- 10:56 AM on 02/08/2020 -----------------------------------------   I have reviewed the triage vital signs and the nursing notes.  HISTORY   Chief Complaint Fall and Arm Injury    HPI Pierceson Cerro is a 59 y.o. male with a history of anxiety, chronic back pain, depression, hyperlipidemia who presents to the ED for left elbow pain.  Patient reports late last night his dog tripped him and he fell injuring his left elbow.  He had had some alcohol last night, denies any alcohol use this morning.  Patient with obvious left elbow deformity.  Past Medical History:  Diagnosis Date  . Anxiety   . Chronic back pain   . Chronic pain   . Depression   . Hyperlipidemia   . Prediabetes 01/08/2018    Patient Active Problem List   Diagnosis Date Noted  . Chronic low back pain (Primary Area of Pain) (Bilateral) (R>L) w/ sciatica (Right) 03/26/2018  . Lumbar facet joint syndrome (Bilateral) (R>L) 03/26/2018  . Spondylosis without myelopathy or radiculopathy, lumbosacral region 03/26/2018  . Chronic lower extremity pain (Secondary Area of Pain) (Bilateral) (R>L) 03/26/2018  . Chronic radicular pain of lower extremity (S1 dermatome) (Right) 03/26/2018  . Chronic neck pain [facility] Area of Pain) (Bilateral) (R>L) 03/26/2018  . Cervicogenic headache (Right) 03/26/2018  . Unilateral occipital headache (Right) 03/26/2018  . Cervico-occipital neuralgia (Fourth Area of Pain) (Right) 03/26/2018  . Chronic shoulder pain (Fifth Area of Pain) (Left) 03/26/2018  . Numbness and tingling of arm (Right) 03/26/2018  . Cervical radiculitis (C7/C8) (Right) 03/26/2018  . Osteoarthritis of spine with radiculopathy, lumbar region (S1) (Right) 03/26/2018  . DDD (degenerative disc disease), lumbar 03/26/2018  . Lumbar facet arthropathy (Bilateral) 03/26/2018  . DDD (degenerative disc  disease), thoracic 03/26/2018  . Thoracic Levoscoliosis 03/26/2018  . Cervical foraminal stenosis (C3-4, C5-6) (Left) 03/26/2018  . DDD (degenerative disc disease), cervical 03/26/2018  . Cervicalgia 03/26/2018  . Chronic pain syndrome 01/15/2018  . Lumbar spondylosis 01/15/2018  . Prediabetes 01/08/2018  . Nocturnal leg cramps 02/19/2017  . Polypharmacy 09/02/2015  . Dyslipidemia 08/04/2015  . Morbid obesity ([place]) 07/04/2015  . Chronic lumbar radiculopathy (S1) (Right) 03/08/2015  . Continuous opioid dependence ([place]) 03/08/2015  . GAD (generalized anxiety disorder) 03/08/2015  . Major depression, chronic 03/08/2015    Past Surgical History:  Procedure Laterality Date  . CATARACT EXTRACTION Right 2014  . NASAL SINUS SURGERY  2004   deviated septum    Allergies Patient has no known allergies.  Social History Social History   Tobacco Use  . Smoking status: Current Every Day Smoker    Packs/day: 1.00    Years: 11.00    Pack years: 11.00    Types: Cigarettes  . Smokeless tobacco: Former Systems developer    Types: Chew, [place] date: 2001  . Tobacco comment: N/A  Substance Use Topics  . Alcohol use: Yes    Alcohol/week: 16.0 standard drinks    Types: 4 Glasses of wine, 12 Cans of beer per week  . Drug use: No    Review of Systems Constitutional: Negative for fever. Cardiovascular: Negative for chest pain. Respiratory: Negative for shortness of breath. Gastrointestinal: Negative for abdominal pain, vomiting and diarrhea. Musculoskeletal: Positive for left elbow pain Skin: Negative for rash. Neurological: Negative for headaches, focal weakness or numbness.  All systems negative/normal/unremarkable except as stated in the HPI  ____________________________________________   PHYSICAL  EXAM:  VITAL SIGNS: ED Triage Vitals  Enc Vitals Group     BP 02/08/20 1005 125/81     Pulse Rate 02/08/20 1005 81     Resp 02/08/20 1005 20     Temp 02/08/20 1005 98.3 F (36.8 C)      Temp Source 02/08/20 1005 Oral     SpO2 02/08/20 1005 96 %     Weight 02/08/20 0959 225 lb (102.1 kg)     Height 02/08/20 0959 5\' 8"  (1.727 m)     Head Circumference --      Peak Flow --      Pain Score 02/08/20 0959 8     Pain Loc --      Pain Edu? --      Excl. in [place]? --     Constitutional: Alert and oriented.  Mild distress from pain Eyes: Conjunctivae are normal. Normal extraocular movements. Cardiovascular: Normal rate, regular rhythm. No murmurs, rubs, or gallops. Respiratory: Normal respiratory effort without tachypnea nor retractions. Breath sounds are clear and equal bilaterally. No wheezes/rales/rhonchi. Musculoskeletal: Obvious left elbow deformity, pain with range of motion of left elbow Neurologic:  Normal speech and language. No gross focal neurologic deficits are appreciated.  Skin:  Skin is warm, dry and intact. No rash noted. Psychiatric: Mood and affect are normal. Speech and behavior are normal.  ____________________________________________  ED COURSE:  As part of my medical decision making, I reviewed the following data within the [place] History obtained from family if available, nursing notes, old chart and ekg, as well as notes from prior ED visits. Patient presented for left elbow dislocation, we will assess with imaging as indicated at this time.   .Sedation  Date/Time: 02/08/2020 11:02 AM Performed by: Earleen Newport, MD Authorized by: Earleen Newport, MD   Consent:    Consent obtained:  Verbal   Consent given by:  Patient Universal protocol:    Immediately prior to procedure a time out was called: yes   Indications:    Procedure performed:  Dislocation reduction   Procedure necessitating sedation performed by:  Physician performing sedation Pre-sedation assessment:    Time since last food or drink:  4   ASA classification: class 2 - patient with mild systemic disease     Neck mobility: normal     Mallampati  score:  II - soft palate, uvula, fauces visible   Pre-sedation assessments completed and reviewed: airway patency, cardiovascular function, mental status and nausea/vomiting   Immediate pre-procedure details:    Reviewed: vital signs     Verified: bag valve mask available, intubation equipment available, IV patency confirmed and oxygen available   Procedure details (see MAR for exact dosages):    Preoxygenation:  Nasal cannula   Sedation:  Propofol   Intended level of sedation: deep   Intra-procedure monitoring:  Blood pressure monitoring, cardiac monitor, continuous pulse oximetry and frequent vital sign checks   Intra-procedure events: none     Total Provider sedation time (minutes):  10 Post-procedure details:    Post-sedation assessment completed:  02/08/2020 11:03 AM   Post-sedation assessments completed and reviewed: airway patency, cardiovascular function, hydration status, mental status, nausea/vomiting, pain level, respiratory function and temperature     Patient is stable for discharge or admission: yes     Patient tolerance:  Tolerated well, no immediate complications    Dejour Primeau was evaluated in Emergency Department on 02/08/2020 for the symptoms described in the history of present illness.  He was evaluated in the context of the global COVID-19 pandemic, which necessitated consideration that the patient might be at risk for infection with the SARS-CoV-2 virus that causes COVID-19. Institutional protocols and algorithms that pertain to the evaluation of patients at risk for COVID-19 are in a state of rapid change based on information released by regulatory bodies including the CDC and federal and state organizations. These policies and algorithms were followed during the patient's care in the ED.  ____________________________________________   RADIOLOGY Images were viewed by me  Left elbow x-rays IMPRESSION: Posterior dislocation of the radius and ulna relative to the  humerus with small avulsed fragments.  ____________________________________________   DIFFERENTIAL DIAGNOSIS   Fracture, dislocation, contusion, sprain  FINAL ASSESSMENT AND PLAN  Left elbow dislocation, Procedural sedation   Plan: The patient had presented for left elbow dislocation.  It was posteriorly dislocated with some small fracture fragments.  Please see reduction/procedural sedation note.  Patient tolerated this well without any sequela.  He was placed in a posterior splint and will be referred to orthopedics for close outpatient follow-up.   Laurence Aly, MD    Note: This note was generated in part or whole with voice recognition software. Voice recognition is usually quite accurate but there are transcription errors that can and very often do occur. I apologize for any typographical errors that were not detected and corrected.     Earleen Newport, MD 02/08/20 1104

## 2020-02-08 NOTE — ED Notes (Signed)
See triage note  Presents s/p fall during the night  States his dog had gotten under his feet  Left elbow swollen with positive deformity noted  Good pulses

## 2020-02-08 NOTE — ED Notes (Signed)
Left arm placed in sling prior to discharge.

## 2020-02-08 NOTE — Sedation Documentation (Signed)
All equipment at bedside in preparation for moderate sedation.

## 2020-02-08 NOTE — Sedation Documentation (Signed)
Pt at pre procedure mental status. Daughter at bedside and verbalized understanding of splint care and post sedation instructions.

## 2020-02-08 NOTE — ED Notes (Signed)
Pt moved to room 19 via w/c  Report called to [facility]

## 2020-02-08 NOTE — ED Triage Notes (Signed)
Pt reports last pm, late, hos dog got up under him and he fell hurting his left elbow. Redness and swelling noted

## 2020-03-01 ENCOUNTER — Other Ambulatory Visit: Payer: Self-pay | Admitting: Family Medicine

## 2020-03-01 DIAGNOSIS — F411 Generalized anxiety disorder: Secondary | ICD-10-CM

## 2020-03-01 DIAGNOSIS — M5416 Radiculopathy, lumbar region: Secondary | ICD-10-CM

## 2020-03-01 NOTE — Telephone Encounter (Signed)
Requested Prescriptions  Pending Prescriptions Disp Refills   hydrOXYzine (ATARAX/VISTARIL) 25 MG tablet [Pharmacy Med Name: HYDROXYZINE HYDROCHLORIDE 25 MG Tablet] 100 tablet 0    Sig: TAKE 1 TABLET (25 MG TOTAL) BY MOUTH 2 (TWO) TIMES DAILY AS NEEDED.     Ear, Nose, and Throat:  Antihistamines Passed - 03/01/2020  4:41 PM      Passed - Valid encounter within last 12 months    Recent Outpatient Visits          3 months ago Morbid obesity [facility])   [facility] [place], Drue Stager, MD   6 months ago Major depression in remission [facility])   [facility] Steele Sizer, MD   9 months ago Major depression in remission [facility])   [facility] Sizer, MD   1 year ago Major depression in remission [facility])   [facility] Sizer, MD   1 year ago Major depression in remission [facility])   [facility] Steele Sizer, MD      Future Appointments            In 1 month [facility]             IBU 600 MG tablet [Pharmacy Med Name: IBU 600 MG Tablet] 180 tablet     Sig: TAKE 1 TABLET TWICE DAILY AS NEEDED     Analgesics:  NSAIDS Failed - 03/01/2020  4:41 PM      Failed - Cr in normal range and within 360 days    Creat  Date Value Ref Range Status  02/20/2019 0.87 0.70 - 1.33 mg/dL Final    Comment:    For patients >60 years of age, the reference limit for Creatinine is approximately 13% higher for people identified as African-American. .          Failed - HGB in normal range and within 360 days    Hemoglobin  Date Value Ref Range Status  02/20/2019 16.2 13.2 - 17.1 g/dL Final         Passed - Patient is not pregnant      Passed - Valid encounter within last 12 months    Recent Outpatient Visits          3 months ago Morbid obesity [facility])   [facility] [place], Drue Stager, MD   6 months ago Major depression in remission [facility])   [facility] Steele Sizer, MD   9 months ago Major depression in remission [facility])   [facility] Steele Sizer, MD   1 year ago Major depression in remission [facility])   [facility] Steele Sizer, MD   1 year ago Major depression in remission [facility])   [facility] Steele Sizer, MD      Future Appointments            In 1 month [facility]             tiZANidine (ZANAFLEX) 4 MG tablet [Pharmacy Med Name: TIZANIDINE HYDROCHLORIDE 4 MG Tablet] 90 tablet 0    Sig: TAKE 1 TABLET EVERY 6 HOURS AS NEEDED FOR MUSCLE SPASM(S)     Not Delegated - Cardiovascular:  Alpha-2 Agonists - tizanidine Failed - 03/01/2020  4:41 PM      Failed - This refill cannot be delegated      Passed - Valid encounter within last 6 months  Recent Outpatient Visits          3 months ago Morbid obesity [facility])   [facility] [place], Drue Stager, MD   6 months ago Major depression in remission [facility])   [facility] Steele Sizer, MD   9 months ago Major depression in remission [facility])   [facility] Steele Sizer, MD   1 year ago Major depression in remission [facility])   [facility] Steele Sizer, MD   1 year ago Major depression in remission [facility])   [facility] Steele Sizer, MD      Future Appointments            In 1 month [facility], [facility]

## 2020-03-01 NOTE — Telephone Encounter (Signed)
Requested medication (s) are due for refill today:  Yes  Requested medication (s) are on the active medication list:  Yes  Future visit scheduled:  Yes  Last Refill: Tizanidine 11/30/19; #90; no refills  Note to Clinic: Tizanidine is not delegated                         Ibuprofen was ordered 11/30/19 and then d/c'd  in error on 02/08/20 (is not on active med sheet)   Requested Prescriptions  Pending Prescriptions Disp Refills   IBU 600 MG tablet [Pharmacy Med Name: IBU 600 MG Tablet] 180 tablet     Sig: TAKE 1 TABLET TWICE DAILY AS NEEDED      Analgesics:  NSAIDS Failed - 03/01/2020  4:41 PM      Failed - Cr in normal range and within 360 days    Creat  Date Value Ref Range Status  02/20/2019 0.87 0.70 - 1.33 mg/dL Final    Comment:    For patients >42 years of age, the reference limit for Creatinine is approximately 13% higher for people identified as African-American. .           Failed - HGB in normal range and within 360 days    Hemoglobin  Date Value Ref Range Status  02/20/2019 16.2 13.2 - 17.1 g/dL Final          Passed - Patient is not pregnant      Passed - Valid encounter within last 12 months    Recent Outpatient Visits           3 months ago Morbid obesity [facility])   [facility] [place], Drue Stager, MD   6 months ago Major depression in remission [facility])   [facility] Steele Sizer, MD   9 months ago Major depression in remission [facility])   [facility] Steele Sizer, MD   1 year ago Major depression in remission [facility])   [facility] Steele Sizer, MD   1 year ago Major depression in remission [facility])   [facility] Steele Sizer, MD       Future Appointments             In 1 month [facility], [place]               tiZANidine (ZANAFLEX) 4 MG tablet [Pharmacy Med Name: TIZANIDINE HYDROCHLORIDE 4 MG Tablet] 90 tablet 0    Sig: TAKE 1  TABLET EVERY 6 HOURS AS NEEDED FOR MUSCLE SPASM(S)      Not Delegated - Cardiovascular:  Alpha-2 Agonists - tizanidine Failed - 03/01/2020  4:41 PM      Failed - This refill cannot be delegated      Passed - Valid encounter within last 6 months    Recent Outpatient Visits           3 months ago Morbid obesity [facility])   [facility] Steele Sizer, MD   6 months ago Major depression in remission [facility])   [facility] Steele Sizer, MD   9 months ago Major depression in remission [facility])   [facility] Steele Sizer, MD   1 year ago Major depression in remission [facility])   [facility] Steele Sizer, MD   1 year ago Major depression in remission [facility])   [facility] Steele Sizer, MD  Future Appointments             In 1 month [facility]              Signed Prescriptions Disp Refills   hydrOXYzine (ATARAX/VISTARIL) 25 MG tablet 100 tablet 0    Sig: TAKE 1 TABLET (25 MG TOTAL) BY MOUTH 2 (TWO) TIMES DAILY AS NEEDED.      Ear, Nose, and Throat:  Antihistamines Passed - 03/01/2020  4:41 PM      Passed - Valid encounter within last 12 months    Recent Outpatient Visits           3 months ago Morbid obesity [facility])   [facility] [place], Drue Stager, MD   6 months ago Major depression in remission [facility])   [facility] Steele Sizer, MD   9 months ago Major depression in remission [facility])   [facility] Sizer, MD   1 year ago Major depression in remission [facility])   [facility] Steele Sizer, MD   1 year ago Major depression in remission [facility])   [facility] Steele Sizer, MD       Future Appointments             In 1 month [facility], [facility]

## 2020-04-12 ENCOUNTER — Ambulatory Visit: Payer: Medicare PPO
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
## 2020-06-01 ENCOUNTER — Ambulatory Visit: Payer: Medicare PPO | Admitting: Family Medicine

## 2020-06-01 ENCOUNTER — Telehealth: Payer: Self-pay | Admitting: Family Medicine

## 2020-06-01 NOTE — Telephone Encounter (Signed)
Copied from [place] [id number]. Topic: Medicare AWV >> Jun 01, 2020  4:17 PM Cher Nakai R wrote: Reason for CRM: Left message for patient to call back and schedule the Medicare Annual Wellness Visit (AWV) in office or virtual  Last AWV 04/09/2019  Please schedule at anytime with [place].  40 minute appointment  Any questions, please contact me at [phone]

## 2020-06-02 ENCOUNTER — Ambulatory Visit (INDEPENDENT_AMBULATORY_CARE_PROVIDER_SITE_OTHER): Payer: Medicare PPO

## 2020-06-02 ENCOUNTER — Encounter: Payer: Self-pay | Admitting: Family Medicine

## 2020-06-02 ENCOUNTER — Ambulatory Visit: Payer: Medicare PPO | Admitting: Family Medicine

## 2020-06-02 DIAGNOSIS — Z23 Encounter for immunization: Secondary | ICD-10-CM

## 2020-06-02 DIAGNOSIS — R739 Hyperglycemia, unspecified: Secondary | ICD-10-CM

## 2020-06-02 DIAGNOSIS — Z0000 Encounter for general adult medical examination without abnormal findings: Secondary | ICD-10-CM

## 2020-06-02 DIAGNOSIS — E782 Mixed hyperlipidemia: Secondary | ICD-10-CM

## 2020-06-02 DIAGNOSIS — E8881 Metabolic syndrome: Secondary | ICD-10-CM

## 2020-06-02 DIAGNOSIS — Z1211 Encounter for screening for malignant neoplasm of colon: Secondary | ICD-10-CM

## 2020-06-02 DIAGNOSIS — F411 Generalized anxiety disorder: Secondary | ICD-10-CM

## 2020-06-02 DIAGNOSIS — F330 Major depressive disorder, recurrent, mild: Secondary | ICD-10-CM

## 2020-06-02 DIAGNOSIS — N528 Other male erectile dysfunction: Secondary | ICD-10-CM | POA: Diagnosis not present

## 2020-06-02 DIAGNOSIS — M5416 Radiculopathy, lumbar region: Secondary | ICD-10-CM

## 2020-06-02 DIAGNOSIS — Z79899 Other long term (current) drug therapy: Secondary | ICD-10-CM

## 2020-06-02 MED ORDER — BUPROPION HCL ER (XL) 300 MG PO TB24: 300.0000 mg | ORAL_TABLET | Freq: Every day | ORAL | 1 refills | Status: DC

## 2020-06-02 MED ORDER — IBUPROFEN 600 MG PO TABS: 600.0000 mg | ORAL_TABLET | Freq: Three times a day (TID) | ORAL | 0 refills | Status: DC | PRN

## 2020-06-02 MED ORDER — CITALOPRAM HYDROBROMIDE 40 MG PO TABS: 40.0000 mg | ORAL_TABLET | Freq: Every day | ORAL | 1 refills | Status: DC

## 2020-06-02 MED ORDER — HYDROXYZINE HCL 25 MG PO TABS: 25.0000 mg | ORAL_TABLET | Freq: Every day | ORAL | 0 refills | Status: DC | PRN

## 2020-06-02 MED ORDER — BUSPIRONE HCL 30 MG PO TABS: 30.0000 mg | ORAL_TABLET | Freq: Every day | ORAL | 1 refills | Status: DC

## 2020-06-02 MED ORDER — ROSUVASTATIN CALCIUM 40 MG PO TABS: 40.0000 mg | ORAL_TABLET | Freq: Every day | ORAL | 1 refills | Status: DC

## 2020-06-02 MED ORDER — TIZANIDINE HCL 4 MG PO TABS: 4.0000 mg | ORAL_TABLET | Freq: Two times a day (BID) | ORAL | 1 refills | Status: DC | PRN
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
## 2020-06-03 LAB — CBC WITH DIFFERENTIAL/PLATELET
Absolute Monocytes: 912 cells/uL (ref 200–950)
Basophils Absolute: 95 cells/uL (ref 0–200)
Basophils Relative: 0.9 %
Eosinophils Absolute: 201 cells/uL (ref 15–500)
Eosinophils Relative: 1.9 %
HCT: 51.5 % — ABNORMAL HIGH (ref 38.5–50.0)
Hemoglobin: 17.6 g/dL — ABNORMAL HIGH (ref 13.2–17.1)
Lymphs Abs: 3042 cells/uL (ref 850–3900)
MCH: 31.1 pg (ref 27.0–33.0)
MCHC: 34.2 g/dL (ref 32.0–36.0)
MCV: 91 fL (ref 80.0–100.0)
MPV: 10 fL (ref 7.5–12.5)
Monocytes Relative: 8.6 %
Neutro Abs: 6349 cells/uL (ref 1500–7800)
Neutrophils Relative %: 59.9 %
Platelets: 293 10*3/uL (ref 140–400)
RBC: 5.66 10*6/uL (ref 4.20–5.80)
RDW: 12.3 % (ref 11.0–15.0)
Total Lymphocyte: 28.7 %
WBC: 10.6 10*3/uL (ref 3.8–10.8)

## 2020-06-03 LAB — COMPLETE METABOLIC PANEL WITH GFR
AG Ratio: 1.7 (calc) (ref 1.0–2.5)
ALT: 46 U/L (ref 9–46)
AST: 27 U/L (ref 10–35)
Albumin: 4.8 g/dL (ref 3.6–5.1)
Alkaline phosphatase (APISO): 66 U/L (ref 35–144)
BUN: 14 mg/dL (ref 7–25)
CO2: 22 mmol/L (ref 20–32)
Calcium: 10.1 mg/dL (ref 8.6–10.3)
Chloride: 104 mmol/L (ref 98–110)
Creat: 1.09 mg/dL (ref 0.70–1.33)
GFR, Est African American: 86 mL/min/{1.73_m2} (ref >=60)
GFR, Est Non African American: 74 mL/min/{1.73_m2} (ref >=60)
Globulin: 2.9 g/dL (calc) (ref 1.9–3.7)
Glucose, Bld: 78 mg/dL (ref 65–99)
Potassium: 5.1 mmol/L (ref 3.5–5.3)
Sodium: 137 mmol/L (ref 135–146)
Total Bilirubin: 0.6 mg/dL (ref 0.2–1.2)
Total Protein: 7.7 g/dL (ref 6.1–8.1)

## 2020-06-03 LAB — LIPID PANEL
Cholesterol: 222 mg/dL — ABNORMAL HIGH (ref <200)
HDL: 43 mg/dL (ref >=40)
LDL Cholesterol (Calc): 131 mg/dL (calc) — ABNORMAL HIGH
Non-HDL Cholesterol (Calc): 179 mg/dL (calc) — ABNORMAL HIGH (ref <130)
Total CHOL/HDL Ratio: 5.2 (calc) — ABNORMAL HIGH (ref <5.0)
Triglycerides: 336 mg/dL — ABNORMAL HIGH (ref <150)

## 2020-06-03 LAB — HEMOGLOBIN A1C
Hgb A1c MFr Bld: 5.3 % of total Hgb (ref <5.7)
Mean Plasma Glucose: 105 (calc)
eAG (mmol/L): 5.8 (calc)

## 2020-06-28 DIAGNOSIS — Z1211 Encounter for screening for malignant neoplasm of colon: Secondary | ICD-10-CM | POA: Diagnosis not present

## 2020-07-05 LAB — COLOGUARD
COLOGUARD: NEGATIVE
Cologuard: NEGATIVE

## 2020-10-06 ENCOUNTER — Other Ambulatory Visit: Payer: Self-pay | Admitting: Family Medicine

## 2020-10-06 DIAGNOSIS — M5416 Radiculopathy, lumbar region: Secondary | ICD-10-CM

## 2020-10-06 DIAGNOSIS — F411 Generalized anxiety disorder: Secondary | ICD-10-CM
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
## 2020-12-01 ENCOUNTER — Ambulatory Visit: Payer: Medicare PPO | Admitting: Family Medicine

## 2020-12-01 ENCOUNTER — Other Ambulatory Visit: Payer: Self-pay

## 2020-12-01 ENCOUNTER — Encounter: Payer: Self-pay | Admitting: Family Medicine

## 2020-12-01 DIAGNOSIS — E782 Mixed hyperlipidemia: Secondary | ICD-10-CM | POA: Diagnosis not present

## 2020-12-01 DIAGNOSIS — F330 Major depressive disorder, recurrent, mild: Secondary | ICD-10-CM

## 2020-12-01 DIAGNOSIS — M5416 Radiculopathy, lumbar region: Secondary | ICD-10-CM | POA: Diagnosis not present

## 2020-12-01 DIAGNOSIS — E8881 Metabolic syndrome: Secondary | ICD-10-CM

## 2020-12-01 DIAGNOSIS — F411 Generalized anxiety disorder: Secondary | ICD-10-CM | POA: Diagnosis not present

## 2020-12-01 DIAGNOSIS — R739 Hyperglycemia, unspecified: Secondary | ICD-10-CM | POA: Diagnosis not present

## 2020-12-01 MED ORDER — CITALOPRAM HYDROBROMIDE 40 MG PO TABS: 40.0000 mg | ORAL_TABLET | Freq: Every day | ORAL | 1 refills | Status: DC

## 2020-12-01 MED ORDER — BUSPIRONE HCL 30 MG PO TABS: 30.0000 mg | ORAL_TABLET | Freq: Every day | ORAL | 1 refills | Status: DC

## 2020-12-01 MED ORDER — BUPROPION HCL ER (XL) 300 MG PO TB24: 300.0000 mg | ORAL_TABLET | Freq: Every day | ORAL | 1 refills | Status: DC

## 2020-12-01 MED ORDER — IBUPROFEN 600 MG PO TABS: 600.0000 mg | ORAL_TABLET | Freq: Three times a day (TID) | ORAL | 1 refills | Status: DC | PRN

## 2020-12-01 MED ORDER — TIZANIDINE HCL 4 MG PO TABS: 4.0000 mg | ORAL_TABLET | Freq: Two times a day (BID) | ORAL | 1 refills | Status: DC | PRN

## 2020-12-01 MED ORDER — ROSUVASTATIN CALCIUM 40 MG PO TABS: 40.0000 mg | ORAL_TABLET | Freq: Every day | ORAL | 1 refills | Status: DC

## 2020-12-01 MED ORDER — HYDROXYZINE HCL 25 MG PO TABS: 25.0000 mg | ORAL_TABLET | Freq: Every day | ORAL | 1 refills | Status: DC

## 2020-12-01 MED ORDER — EZETIMIBE 10 MG PO TABS: 10.0000 mg | ORAL_TABLET | Freq: Every day | ORAL | 1 refills | Status: DC

## 2021-04-18 NOTE — Patient Instructions (Signed)
Preventive Care 40-60 Years Old, Male Preventive care refers to lifestyle choices and visits with your health care provider that can promote health and wellness. This includes: A yearly physical exam. This is also called an annual wellness visit. Regular dental and eye exams. Immunizations. Screening for certain conditions. Healthy lifestyle choices, such as: Eating a healthy diet. Getting regular exercise. Not using drugs or products that contain nicotine and tobacco. Limiting alcohol use. What can I expect for my preventive care visit? Physical exam Your health care provider will check your: Height and weight. These may be used to calculate your BMI (body mass index). BMI is a measurement that tells if you are at a healthy weight. Heart rate and blood pressure. Body temperature. Skin for abnormal spots. Counseling Your health care provider may ask you questions about your: Past medical problems. Family's medical history. Alcohol, tobacco, and drug use. Emotional well-being. Home life and relationship well-being. Sexual activity. Diet, exercise, and sleep habits. Work and work environment. Access to firearms. What immunizations do I need?  Vaccines are usually given at various ages, according to a schedule. Your health care provider will recommend vaccines for you based on your age, medicalhistory, and lifestyle or other factors, such as travel or where you work. What tests do I need? Blood tests Lipid and cholesterol levels. These may be checked every 5 years, or more often if you are over 50 years old. Hepatitis C test. Hepatitis B test. Screening Lung cancer screening. You may have this screening every year starting at age 55 if you have a 30-pack-year history of smoking and currently smoke or have quit within the past 15 years. Prostate cancer screening. Recommendations will vary depending on your family history and other risks. Genital exam to check for testicular cancer  or hernias. Colorectal cancer screening. All adults should have this screening starting at age 50 and continuing until age 75. Your health care provider may recommend screening at age 45 if you are at increased risk. You will have tests every 1-10 years, depending on your results and the type of screening test. Diabetes screening. This is done by checking your blood sugar (glucose) after you have not eaten for a while (fasting). You may have this done every 1-3 years. STD (sexually transmitted disease) testing, if you are at risk. Follow these instructions at home: Eating and drinking  Eat a diet that includes fresh fruits and vegetables, whole grains, lean protein, and low-fat dairy products. Take vitamin and mineral supplements as recommended by your health care provider. Do not drink alcohol if your health care provider tells you not to drink. If you drink alcohol: Limit how much you have to 0-2 drinks a day. Be aware of how much alcohol is in your drink. In the [place] one drink equals one 12 oz bottle of beer (355 mL), one 5 oz glass of wine (148 mL), or one 1 oz glass of hard liquor (44 mL).  Lifestyle Take daily care of your teeth and gums. Brush your teeth every morning and night with fluoride toothpaste. Floss one time each day. Stay active. Exercise for at least 30 minutes 5 or more days each week. Do not use any products that contain nicotine or tobacco, such as cigarettes, e-cigarettes, and chewing tobacco. If you need help quitting, ask your health care provider. Do not use drugs. If you are sexually active, practice safe sex. Use a condom or other form of protection to prevent STIs (sexually transmitted infections). If told by   your health care provider, take low-dose aspirin daily starting at age 50. Find healthy ways to cope with stress, such as: Meditation, yoga, or listening to music. Journaling. Talking to a trusted person. Spending time with friends and  family. Safety Always wear your seat belt while driving or riding in a vehicle. Do not drive: If you have been drinking alcohol. Do not ride with someone who has been drinking. When you are tired or distracted. While texting. Wear a helmet and other protective equipment during sports activities. If you have firearms in your house, make sure you follow all gun safety procedures. What's next? Go to your health care provider once a year for an annual wellness visit. Ask your health care provider how often you should have your eyes and teeth checked. Stay up to date on all vaccines. This information is not intended to replace advice given to you by your health care provider. Make sure you discuss any questions you have with your healthcare provider. Document Revised: 06/16/2019 Document Reviewed: 09/11/2018 Elsevier Patient Education  2022 Elsevier Inc.  

## 2021-04-18 NOTE — Progress Notes (Addendum)
Name: John Garrett   MRN: [id number]    DOB: 1961/06/14   Date:04/19/2021       Progress Note  Subjective  Chief Complaint  Annual Exam  HPI  Patient presents for annual CPE .  IPSS Questionnaire (AUA-7): Over the past month.   1)  How often have you had a sensation of not emptying your bladder completely after you finish urinating?  0 - Not at all  2)  How often have you had to urinate again less than two hours after you finished urinating? 0 - Not at all  3)  How often have you found you stopped and started again several times when you urinated?  0 - Not at all  4) How difficult have you found it to postpone urination?  0 - Not at all  5) How often have you had a weak urinary stream?  0 - Not at all  6) How often have you had to push or strain to begin urination?  0 - Not at all  7) How many times did you most typically get up to urinate from the time you went to bed until the time you got up in the morning?  0 - None  Total score:  0-7 mildly symptomatic   8-19 moderately symptomatic   20-35 severely symptomatic     Diet: cooks at home , balanced diet  Exercise:sedentary at this time, he states not enough gas money to pay gas to go to the gym.   Depression: phq 9 is negative Depression screen [facility] 2/9 04/19/2021 12/01/2020 06/02/2020 06/02/2020 11/30/2019  Decreased Interest 0 1 - 1 0  Down, Depressed, Hopeless 0 0 0 1 1  PHQ - 2 Score 0 1 0 2 1  Altered sleeping 0 0 0 1 0  Tired, decreased energy 0 1 0 0 0  Change in appetite 0 0 0 0 0  Feeling bad or failure about yourself  0 1 0 0 0  Trouble concentrating 0 0 0 0 0  Moving slowly or fidgety/restless 0 0 0 0 0  Suicidal thoughts 0 0 0 0 0  PHQ-9 Score 0 3 0 3 1  Difficult doing work/chores - - Somewhat difficult Somewhat difficult Not difficult at all  Some recent data might be hidden    Hypertension:  BP Readings from Last 3 Encounters:  04/19/21 112/78  12/01/20 100/78  06/02/20 108/80    Obesity: Wt Readings from  Last 3 Encounters:  04/19/21 231 lb (104.8 kg)  12/01/20 232 lb 12.8 oz (105.6 kg)  06/02/20 224 lb 1.6 oz (101.7 kg)   BMI Readings from Last 3 Encounters:  04/19/21 35.12 kg/m  12/01/20 35.40 kg/m  06/02/20 34.07 kg/m     Lipids:  Lab Results  Component Value Date   CHOL 222 (H) 06/02/2020   CHOL 175 07/14/2018   CHOL 249 (H) 01/07/2018   Lab Results  Component Value Date   HDL 43 06/02/2020   HDL 42 07/14/2018   HDL 43 01/07/2018   Lab Results  Component Value Date   LDLCALC 131 (H) 06/02/2020   LDLCALC 99 07/14/2018   LDLCALC 164 (H) 01/07/2018   Lab Results  Component Value Date   TRIG 336 (H) 06/02/2020   TRIG 227 (H) 07/14/2018   TRIG 255 (H) 01/07/2018   Lab Results  Component Value Date   CHOLHDL 5.2 (H) 06/02/2020   CHOLHDL 4.2 07/14/2018   CHOLHDL 5.8 (H) 01/07/2018   No results found  for: LDLDIRECT Glucose:  Glucose, Bld  Date Value Ref Range Status  06/02/2020 78 65 - 99 mg/dL Final    Comment:    .            Fasting reference interval .   02/20/2019 101 (H) 65 - 99 mg/dL Final    Comment:    .            Fasting reference interval . For someone without known diabetes, a glucose value between 100 and 125 mg/dL is consistent with prediabetes and should be confirmed with a follow-up test. .   07/14/2018 102 65 - 139 mg/dL Final    Comment:    .        Non-fasting reference interval .     [place] Office Visit from 04/19/2021 in [facility]  AUDIT-C Score 6        Divorced STD testing and prevention (HIV/chl/gon/syphilis): 02/20/19 Hep C: 02/20/19  Skin cancer: Discussed monitoring for atypical lesions Colorectal cancer: 06/21/20 Prostate cancer: discussed USPTF   Lung cancer:  Low Dose CT Chest recommended if Age 30-80 years, 30 pack-year currently smoking OR have quit w/in 15years. He is not interested at this time  AAA:  The USPSTF recommends one-time screening with ultrasonography in men  ages 59 to 9 years who have ever smoked ECG:  01/21/17  Vaccines:   Shingrix: 7-60 yo and ask insurance if covered when patient above 60 yo - sent rx to pharmacy today  Pneumonia:  educated and discussed with patient. Flu:  educated and discussed with patient.  Advanced Care Planning: A voluntary discussion about advance care planning including the explanation and discussion of advance directives.  Discussed health care proxy and Living will, and the patient was able to identify a health care proxy as daughter .   Patient Active Problem List   Diagnosis Date Noted   Chronic low back pain (Primary Area of Pain) (Bilateral) (R>L) w/ sciatica (Right) 03/26/2018   Lumbar facet joint syndrome (Bilateral) (R>L) 03/26/2018   Spondylosis without myelopathy or radiculopathy, lumbosacral region 03/26/2018   Chronic lower extremity pain (Secondary Area of Pain) (Bilateral) (R>L) 03/26/2018   Chronic radicular pain of lower extremity (S1 dermatome) (Right) 03/26/2018   Chronic neck pain (Tertiary Area of Pain) (Bilateral) (R>L) 03/26/2018   Cervicogenic headache (Right) 03/26/2018   Unilateral occipital headache (Right) 03/26/2018   Cervico-occipital neuralgia (Fourth Area of Pain) (Right) 03/26/2018   Chronic shoulder pain (Fifth Area of Pain) (Left) 03/26/2018   Numbness and tingling of arm (Right) 03/26/2018   Cervical radiculitis (C7/C8) (Right) 03/26/2018   Osteoarthritis of spine with radiculopathy, lumbar region (S1) (Right) 03/26/2018   DDD (degenerative disc disease), lumbar 03/26/2018   Lumbar facet arthropathy (Bilateral) 03/26/2018   DDD (degenerative disc disease), thoracic 03/26/2018   Thoracic Levoscoliosis 03/26/2018   Cervical foraminal stenosis (C3-4, C5-6) (Left) 03/26/2018   DDD (degenerative disc disease), cervical 03/26/2018   Cervicalgia 03/26/2018   Chronic pain syndrome 01/15/2018   Lumbar spondylosis 01/15/2018   Prediabetes 01/08/2018   Nocturnal leg cramps  02/19/2017   Polypharmacy 09/02/2015   Dyslipidemia 08/04/2015   Morbid obesity ([place]) 07/04/2015   Chronic lumbar radiculopathy (S1) (Right) 03/08/2015   Continuous opioid dependence ([place]) 03/08/2015   GAD (generalized anxiety disorder) 03/08/2015   Major depression, chronic 03/08/2015    Past Surgical History:  Procedure Laterality Date   CATARACT EXTRACTION Right 2014   NASAL SINUS SURGERY  2004   deviated septum  Family History  Adopted: Yes  Family history unknown: Yes    Social History   Socioeconomic History   Marital status: Divorced    Spouse name: Not on file   Number of children: 1   Years of education: Not on file   Highest education level: 10th grade  Occupational History   Occupation: disabled     Comment: since 2004, accident at work 2001, 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   Tobacco Use   Smoking status: Every Day    Packs/day: 1.00    Years: 25.00    Pack years: 25.00    Types: Cigarettes    Start date: 1978   Smokeless tobacco: Former    Types: Chew, Snuff    Quit date: 2001   Tobacco comments:    He started at 54, stopped for over 10 years but is smoking again and not ready to quit   Vaping Use   Vaping Use: Former  Substance and Sexual Activity   Alcohol use: Yes    Alcohol/week: 16.0 standard drinks    Types: 4 Glasses of wine, 12 Cans of beer per week   Drug use: No   Sexual activity: Not Currently    Partners: Female    Birth control/protection: Condom  Other Topics Concern   Not on file  Social History Narrative   Not on file   Social Determinants of Health   Financial Resource Strain: Medium Risk   Difficulty of Paying Living Expenses: Somewhat hard  Food Insecurity: No Food Insecurity   Worried About Charity fundraiser in the Last Year: Never true   Ran Out of Food in the Last Year: Never true  Transportation Needs: No Transportation Needs   Lack of Transportation (Medical): No   Lack of Transportation  (Non-Medical): No  Physical Activity: Inactive   Days of Exercise per Week: 0 days   Minutes of Exercise per Session: 0 min  Stress: Stress Concern Present   Feeling of Stress : To some extent  Social Connections: Socially Isolated   Frequency of Communication with Friends and Family: Once a week   Frequency of Social Gatherings with Friends and Family: Never   Attends Religious Services: Never   Printmaker: No   Attends Music therapist: Never   Marital Status: Divorced  Human resources officer Violence: Not At Risk   Fear of Current or Ex-Partner: No   Emotionally Abused: No   Physically Abused: No   Sexually Abused: No     Current Outpatient Medications:    buPROPion (WELLBUTRIN XL) 300 MG 24 hr tablet, Take 1 tablet (300 mg total) by mouth daily., Disp: 90 tablet, Rfl: 1   busPIRone (BUSPAR) 30 MG tablet, Take 1 tablet (30 mg total) by mouth daily., Disp: 90 tablet, Rfl: 1   citalopram (CELEXA) 40 MG tablet, Take 1 tablet (40 mg total) by mouth daily., Disp: 90 tablet, Rfl: 1   ezetimibe (ZETIA) 10 MG tablet, Take 1 tablet (10 mg total) by mouth daily., Disp: 90 tablet, Rfl: 1   hydrOXYzine (ATARAX/VISTARIL) 25 MG tablet, Take 1 tablet (25 mg total) by mouth daily., Disp: 90 tablet, Rfl: 1   ibuprofen (IBU) 600 MG tablet, Take 1 tablet (600 mg total) by mouth every 8 (eight) hours as needed., Disp: 100 tablet, Rfl: 1   rosuvastatin (CRESTOR) 40 MG tablet, Take 1 tablet (40 mg total) by mouth daily., Disp: 90 tablet, Rfl: 1  sildenafil (VIAGRA) 100 MG tablet, Take 0.5-1 tablets (50-100 mg total) by mouth daily as needed for erectile dysfunction., Disp: 30 tablet, Rfl: 0   tiZANidine (ZANAFLEX) 4 MG tablet, Take 1 tablet (4 mg total) by mouth 2 (two) times daily as needed for muscle spasms., Disp: 180 tablet, Rfl: 1   Zoster Vaccine Adjuvanted (SHINGRIX) injection, Inject 0.5 mLs into the muscle once for 1 dose., Disp: 0.5 mL, Rfl: 1  No Known  Allergies   ROS  Constitutional: Negative for fever or weight change.  Respiratory: positive for morning cough but no shortness of breath.   Cardiovascular: Negative for chest pain or palpitations.  Gastrointestinal: Negative for abdominal pain, no bowel changes.  Musculoskeletal: Negative for gait problem or joint swelling.  Skin: Negative for rash.  Neurological: Negative for dizziness or headache.  No other specific complaints in a complete review of systems (except as listed in HPI above).    Objective  Vitals:   04/19/21 0916  BP: 112/78  Pulse: 84  Resp: 16  Temp: 98.3 F (36.8 C)  TempSrc: Oral  SpO2: 95%  Weight: 231 lb (104.8 kg)  Height: 5\' 8"  (1.727 m)    Body mass index is 35.12 kg/m.  Physical Exam  Constitutional: Patient appears well-developed and well-nourished. No distress.  HENT: Head: Normocephalic and atraumatic. Ears: B TMs ok, no erythema or effusion; Nose: Nose normal. Mouth/Throat: Oropharynx is clear and moist. No oropharyngeal exudate.  Eyes: Conjunctivae and EOM are normal. Pupils are equal, round, and reactive to light. No scleral icterus.  Neck: Normal range of motion. Neck supple. No JVD present. No thyromegaly present.  Cardiovascular: Normal rate, regular rhythm and normal heart sounds.  No murmur heard. No BLE edema. Pulmonary/Chest: Effort normal and breath sounds normal. No respiratory distress. Abdominal: Soft. Bowel sounds are normal, no distension. There is no tenderness. no masses MALE GENITALIA: Normal descended testes bilaterally, no masses palpated, no hernias, no lesions, no discharge RECTAL: not done  Musculoskeletal: pain during palpation of lumbar spine, negative straight leg raise no joint effusions. No gross deformities Neurological: he is alert and oriented to person, place, and time. No cranial nerve deficit. Coordination, balance, strength, speech and gait are normal.  Skin: Skin is warm and dry. No rash noted. No  erythema.  Psychiatric: Patient has a normal mood and affect. behavior is normal. Judgment and thought content normal.   Fall Risk: Fall Risk  04/19/2021 12/01/2020 06/02/2020 06/02/2020 08/26/2019  Falls in the past year? 0 0 1 1 0  Number falls in past yr: 0 0 0 0 0  Injury with Fall? 0 0 1 1 0  Risk Factor Category  - - - - -  Risk for fall due to : - - No Fall Risks - -  Risk for fall due to: Comment - - - - -  Follow up - - Falls prevention discussed - -     Functional Status Survey: Is the patient deaf or have difficulty hearing?: No Does the patient have difficulty seeing, even when wearing glasses/contacts?: No Does the patient have difficulty concentrating, remembering, or making decisions?: No Does the patient have difficulty walking or climbing stairs?: No Does the patient have difficulty dressing or bathing?: No Does the patient have difficulty doing errands alone such as visiting a doctor's office or shopping?: No    Assessment & Plan  1. Well adult exam  - Lipid panel - COMPLETE METABOLIC PANEL WITH GFR - CBC with Differential/Platelet - Hemoglobin  A1c  2. Hyperglycemia  - Hemoglobin A1c  3. Mixed hyperlipidemia  - Lipid panel  4. Long-term use of high-risk medication  - COMPLETE METABOLIC PANEL WITH GFR - CBC with Differential/Platelet  5. Need for shingles vaccine  - Zoster Vaccine Adjuvanted [facility]) injection; Inject 0.5 mLs into the muscle once for 1 dose.  Dispense: 0.5 mL; Refill: 1  6. Smokers' cough ([place])  He has been a smoker for 25 plus years and has a smoker's cough, discussed quitting , but not willing. He is also not interested in having a lung CT scan     7. Coronary artery disease involving native coronary artery of native heart without angina pectoris  Seen on CT chest done 2015 , discussed importance of lowering LDL , he is on statin therapy  -Prostate cancer screening and PSA options (with potential risks and benefits of testing  vs not testing) were discussed along with recent recs/guidelines. -USPSTF grade A and B recommendations reviewed with patient; age-appropriate recommendations, preventive care, screening tests, etc discussed and encouraged; healthy living encouraged; see AVS for patient education given to patient -Discussed importance of 150 minutes of physical activity weekly, eat two servings of fish weekly, eat one serving of tree nuts ( cashews, pistachios, pecans, almonds.Marland Kitchen) every other day, eat 6 servings of fruit/vegetables daily and drink plenty of water and

## 2021-04-19 ENCOUNTER — Other Ambulatory Visit: Payer: Self-pay

## 2021-04-19 ENCOUNTER — Ambulatory Visit (INDEPENDENT_AMBULATORY_CARE_PROVIDER_SITE_OTHER): Payer: Medicare PPO | Admitting: Family Medicine

## 2021-04-19 ENCOUNTER — Encounter: Payer: Self-pay | Admitting: Family Medicine

## 2021-04-19 VITALS — BP 112/78 | HR 84 | Temp 98.3°F | Resp 16 | Ht 68.0 in | Wt 231.0 lb

## 2021-04-19 DIAGNOSIS — R739 Hyperglycemia, unspecified: Secondary | ICD-10-CM

## 2021-04-19 DIAGNOSIS — Z23 Encounter for immunization: Secondary | ICD-10-CM

## 2021-04-19 DIAGNOSIS — E782 Mixed hyperlipidemia: Secondary | ICD-10-CM

## 2021-04-19 DIAGNOSIS — Z0000 Encounter for general adult medical examination without abnormal findings: Secondary | ICD-10-CM | POA: Diagnosis not present

## 2021-04-19 DIAGNOSIS — J410 Simple chronic bronchitis: Secondary | ICD-10-CM | POA: Insufficient documentation

## 2021-04-19 DIAGNOSIS — Z79899 Other long term (current) drug therapy: Secondary | ICD-10-CM

## 2021-04-19 DIAGNOSIS — I2510 Atherosclerotic heart disease of native coronary artery without angina pectoris: Secondary | ICD-10-CM | POA: Diagnosis not present

## 2021-04-19 MED ORDER — SHINGRIX 50 MCG/0.5ML IM SUSR: 0.5000 mL | Freq: Once | INTRAMUSCULAR | 1 refills | Status: AC

## 2021-04-19 NOTE — Progress Notes (Signed)
He is not interested in quitting at this time Discussed AA meetings  He states he drinks sometimes to control his pain

## 2021-06-06 ENCOUNTER — Ambulatory Visit: Payer: Medicare PPO

## 2021-06-06 ENCOUNTER — Ambulatory Visit: Payer: Medicare PPO | Admitting: Family Medicine

## 2021-06-10 ENCOUNTER — Telehealth: Payer: Self-pay | Admitting: Family Medicine

## 2021-06-10 NOTE — Telephone Encounter (Signed)
Copied from [place] [id number]. Topic: Medicare AWV >> Jun 10, 2021  8:59 AM Cher Nakai R wrote: Reason for CRM:  Left message for patient to call back and schedule Medicare Annual Wellness Visit (AWV) in office.   If unable to come into the office for AWV,  please offer to do virtually or by telephone.  Last AWV: 06/02/2020  Please schedule at anytime with [place].  40 minute appointment  Any questions, please contact me at [phone]

## 2021-06-27 ENCOUNTER — Encounter: Payer: Self-pay | Admitting: Family Medicine

## 2021-06-27 ENCOUNTER — Other Ambulatory Visit: Payer: Self-pay

## 2021-06-27 ENCOUNTER — Ambulatory Visit: Payer: Medicare PPO | Admitting: Family Medicine

## 2021-06-27 DIAGNOSIS — Z79899 Other long term (current) drug therapy: Secondary | ICD-10-CM | POA: Diagnosis not present

## 2021-06-27 DIAGNOSIS — N528 Other male erectile dysfunction: Secondary | ICD-10-CM

## 2021-06-27 DIAGNOSIS — E8881 Metabolic syndrome: Secondary | ICD-10-CM | POA: Diagnosis not present

## 2021-06-27 DIAGNOSIS — F330 Major depressive disorder, recurrent, mild: Secondary | ICD-10-CM | POA: Diagnosis not present

## 2021-06-27 DIAGNOSIS — M5416 Radiculopathy, lumbar region: Secondary | ICD-10-CM

## 2021-06-27 DIAGNOSIS — J410 Simple chronic bronchitis: Secondary | ICD-10-CM | POA: Diagnosis not present

## 2021-06-27 DIAGNOSIS — F411 Generalized anxiety disorder: Secondary | ICD-10-CM

## 2021-06-27 DIAGNOSIS — E782 Mixed hyperlipidemia: Secondary | ICD-10-CM

## 2021-06-27 DIAGNOSIS — I2510 Atherosclerotic heart disease of native coronary artery without angina pectoris: Secondary | ICD-10-CM | POA: Diagnosis not present

## 2021-06-27 DIAGNOSIS — Z23 Encounter for immunization: Secondary | ICD-10-CM

## 2021-06-27 DIAGNOSIS — R739 Hyperglycemia, unspecified: Secondary | ICD-10-CM

## 2021-06-27 DIAGNOSIS — Z0000 Encounter for general adult medical examination without abnormal findings: Secondary | ICD-10-CM | POA: Diagnosis not present

## 2021-06-27 MED ORDER — HYDROXYZINE HCL 25 MG PO TABS: 25.0000 mg | ORAL_TABLET | Freq: Every day | ORAL | 1 refills | Status: DC

## 2021-06-27 MED ORDER — CELECOXIB 200 MG PO CAPS: 200.0000 mg | ORAL_CAPSULE | Freq: Every day | ORAL | 1 refills | Status: DC

## 2021-06-27 MED ORDER — SPIRIVA HANDIHALER 18 MCG IN CAPS: 18.0000 ug | ORAL_CAPSULE | Freq: Every day | RESPIRATORY_TRACT | 1 refills | Status: DC

## 2021-06-27 MED ORDER — CITALOPRAM HYDROBROMIDE 40 MG PO TABS: 40.0000 mg | ORAL_TABLET | Freq: Every day | ORAL | 1 refills | Status: DC

## 2021-06-27 MED ORDER — SILDENAFIL CITRATE 100 MG PO TABS: 50.0000 mg | ORAL_TABLET | Freq: Every day | ORAL | 0 refills | Status: AC | PRN

## 2021-06-27 MED ORDER — BUPROPION HCL ER (XL) 300 MG PO TB24: 300.0000 mg | ORAL_TABLET | Freq: Every day | ORAL | 1 refills | Status: DC

## 2021-06-27 MED ORDER — BUSPIRONE HCL 30 MG PO TABS: 30.0000 mg | ORAL_TABLET | Freq: Every day | ORAL | 1 refills | Status: DC

## 2021-06-27 MED ORDER — TIZANIDINE HCL 4 MG PO TABS: 4.0000 mg | ORAL_TABLET | Freq: Two times a day (BID) | ORAL | 1 refills | Status: DC | PRN

## 2021-06-27 MED ORDER — EZETIMIBE 10 MG PO TABS: 10.0000 mg | ORAL_TABLET | Freq: Every day | ORAL | 1 refills | Status: DC

## 2021-06-27 MED ORDER — ROSUVASTATIN CALCIUM 40 MG PO TABS: 40.0000 mg | ORAL_TABLET | Freq: Every day | ORAL | 1 refills | Status: DC
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
## 2021-06-28 LAB — COMPLETE METABOLIC PANEL WITH GFR
AG Ratio: 1.9 (calc) (ref 1.0–2.5)
ALT: 34 U/L (ref 9–46)
AST: 24 U/L (ref 10–35)
Albumin: 4.7 g/dL (ref 3.6–5.1)
Alkaline phosphatase (APISO): 47 U/L (ref 35–144)
BUN: 19 mg/dL (ref 7–25)
CO2: 24 mmol/L (ref 20–32)
Calcium: 9.6 mg/dL (ref 8.6–10.3)
Chloride: 104 mmol/L (ref 98–110)
Creat: 0.89 mg/dL (ref 0.70–1.30)
Globulin: 2.5 g/dL (calc) (ref 1.9–3.7)
Glucose, Bld: 90 mg/dL (ref 65–99)
Potassium: 4.6 mmol/L (ref 3.5–5.3)
Sodium: 137 mmol/L (ref 135–146)
Total Bilirubin: 0.6 mg/dL (ref 0.2–1.2)
Total Protein: 7.2 g/dL (ref 6.1–8.1)
eGFR: 99 mL/min/{1.73_m2} (ref >=60)

## 2021-06-28 LAB — HEMOGLOBIN A1C
Hgb A1c MFr Bld: 5.3 % of total Hgb (ref <5.7)
Mean Plasma Glucose: 105 mg/dL
eAG (mmol/L): 5.8 mmol/L

## 2021-06-28 LAB — CBC WITH DIFFERENTIAL/PLATELET
Absolute Monocytes: 722 cells/uL (ref 200–950)
Basophils Absolute: 77 cells/uL (ref 0–200)
Basophils Relative: 0.9 %
Eosinophils Absolute: 198 cells/uL (ref 15–500)
Eosinophils Relative: 2.3 %
HCT: 49 % (ref 38.5–50.0)
Hemoglobin: 16.5 g/dL (ref 13.2–17.1)
Lymphs Abs: 3775 cells/uL (ref 850–3900)
MCH: 30.6 pg (ref 27.0–33.0)
MCHC: 33.7 g/dL (ref 32.0–36.0)
MCV: 90.9 fL (ref 80.0–100.0)
MPV: 10.1 fL (ref 7.5–12.5)
Monocytes Relative: 8.4 %
Neutro Abs: 3827 cells/uL (ref 1500–7800)
Neutrophils Relative %: 44.5 %
Platelets: 268 10*3/uL (ref 140–400)
RBC: 5.39 10*6/uL (ref 4.20–5.80)
RDW: 13.2 % (ref 11.0–15.0)
Total Lymphocyte: 43.9 %
WBC: 8.6 10*3/uL (ref 3.8–10.8)

## 2021-06-28 LAB — LIPID PANEL
Cholesterol: 144 mg/dL (ref <200)
HDL: 41 mg/dL (ref >=40)
LDL Cholesterol (Calc): 69 mg/dL (calc)
Non-HDL Cholesterol (Calc): 103 mg/dL (calc) (ref <130)
Total CHOL/HDL Ratio: 3.5 (calc) (ref <5.0)
Triglycerides: 259 mg/dL — ABNORMAL HIGH (ref <150)

## 2021-06-29 ENCOUNTER — Telehealth: Payer: Self-pay

## 2021-06-29 NOTE — Telephone Encounter (Signed)
All labs were normal, ok for [facility] to disclose.

## 2021-06-29 NOTE — Telephone Encounter (Signed)
Copied from [place] [id number]. Topic: General - Call Back - No Documentation >> Jun 29, 2021 11:39 AM Erick Blinks wrote: Reason for CRM: Pt called back for lab results, please advise if [facility] may disclose.  Best contact: [phone]

## 2021-06-29 NOTE — Telephone Encounter (Signed)
Copied from [place] [phone]. Topic: General - Other >> Jun 29, 2021 10:08 AM Bayard Beaver wrote: Reason for [id number] says missed call, not sure what it was about. Please call back

## 2021-06-29 NOTE — Telephone Encounter (Signed)
Left message with lab result information. Advised to call back or send MyChart message with any questions or concerns.

## 2021-08-22 ENCOUNTER — Ambulatory Visit (INDEPENDENT_AMBULATORY_CARE_PROVIDER_SITE_OTHER): Payer: Medicare PPO

## 2021-08-22 DIAGNOSIS — Z0000 Encounter for general adult medical examination without abnormal findings: Secondary | ICD-10-CM

## 2021-08-22 NOTE — Progress Notes (Signed)
Subjective:   John Garrett is a 60 y.o. male who presents for Medicare Annual/Subsequent preventive examination.  Virtual Visit via Telephone Note  I connected with  Brendyn Mclaren on 08/22/21 at  1:30 PM EST by telephone and verified that I am speaking with the correct person using two identifiers.  Location: Patient: home Provider: [place] Persons participating in the virtual visit: [place]   I discussed the limitations, risks, security and privacy concerns of performing an evaluation and management service by telephone and the availability of in person appointments. The patient expressed understanding and agreed to proceed.  Interactive audio and video telecommunications were attempted between this nurse and patient, however failed, due to patient having technical difficulties OR patient did not have access to video capability.  We continued and completed visit with audio only.  Some vital signs may be absent or patient reported.   Clemetine Marker, LPN   Review of Systems     Cardiac Risk Factors include: advanced age (>12men, >65 women);dyslipidemia;male gender;obesity (BMI >30kg/m2);smoking/ tobacco exposure     Objective:    There were no vitals filed for this visit. There is no height or weight on file to calculate BMI.  Advanced Directives 08/22/2021 02/08/2020 04/09/2019 04/22/2018 04/01/2018 01/15/2018 07/30/2017  Does Patient Have a Medical Advance Directive? No No No No No No No  Would patient like information on creating a medical advance directive? Yes (MAU/Ambulatory/Procedural Areas - Information given) No - Patient declined No - Patient declined No - Patient declined Yes (MAU/Ambulatory/Procedural Areas - Information given) No - Patient declined -    Current Medications (verified) Outpatient Encounter Medications as of 08/22/2021  Medication Sig   buPROPion (WELLBUTRIN XL) 300 MG 24 hr tablet Take 1 tablet (300 mg total) by mouth daily.   busPIRone  (BUSPAR) 30 MG tablet Take 1 tablet (30 mg total) by mouth daily.   celecoxib (CELEBREX) 200 MG capsule Take 1 capsule (200 mg total) by mouth daily.   citalopram (CELEXA) 40 MG tablet Take 1 tablet (40 mg total) by mouth daily.   ezetimibe (ZETIA) 10 MG tablet Take 1 tablet (10 mg total) by mouth daily.   hydrOXYzine (ATARAX/VISTARIL) 25 MG tablet Take 1 tablet (25 mg total) by mouth daily.   rosuvastatin (CRESTOR) 40 MG tablet Take 1 tablet (40 mg total) by mouth daily.   sildenafil (VIAGRA) 100 MG tablet Take 0.5-1 tablets (50-100 mg total) by mouth daily as needed for erectile dysfunction.   tiotropium (SPIRIVA HANDIHALER) 18 MCG inhalation capsule Place 1 capsule (18 mcg total) into inhaler and inhale daily.   tiZANidine (ZANAFLEX) 4 MG tablet Take 1 tablet (4 mg total) by mouth 2 (two) times daily as needed for muscle spasms.   No facility-administered encounter medications on file as of 08/22/2021.    Allergies (verified) Patient has no known allergies.   History: Past Medical History:  Diagnosis Date   Anxiety    Chronic back pain    Chronic pain    Depression    Hyperlipidemia    Prediabetes 01/08/2018   Past Surgical History:  Procedure Laterality Date   CATARACT EXTRACTION Right 2014   NASAL SINUS SURGERY  2004   deviated septum   Family History  Adopted: Yes  Family history unknown: Yes   Social History   Socioeconomic History   Marital status: Divorced    Spouse name: Not on file   Number of children: 1   Years of education: Not on file  Highest education level: 10th grade  Occupational History   Occupation: disabled     Comment: since 2004, accident at work 2001, used to Education administrator and also Horticulturist, commercial   Tobacco Use   Smoking status: Every Day    Packs/day: 1.00    Years: 25.00    Pack years: 25.00    Types: Cigarettes    Start date: 1978   Smokeless tobacco: Former    Types: Chew, Snuff    Quit date: 2001   Tobacco comments:    He  started at 28, stopped for over 10 years but is smoking again and not ready to quit   Vaping Use   Vaping Use: Former  Substance and Sexual Activity   Alcohol use: Yes    Alcohol/week: 16.0 standard drinks    Types: 4 Glasses of wine, 12 Cans of beer per week   Drug use: No   Sexual activity: Not Currently    Partners: Female    Birth control/protection: Condom  Other Topics Concern   Not on file  Social History Narrative   Pt lives alone   Social Determinants of Health   Financial Resource Strain: Low Risk    Difficulty of Paying Living Expenses: Not very hard  Food Insecurity: No Food Insecurity   Worried About Charity fundraiser in the Last Year: Never true   Ran Out of Food in the Last Year: Never true  Transportation Needs: No Transportation Needs   Lack of Transportation (Medical): No   Lack of Transportation (Non-Medical): No  Physical Activity: Inactive   Days of Exercise per Week: 0 days   Minutes of Exercise per Session: 0 min  Stress: No Stress Concern Present   Feeling of Stress : Not at all  Social Connections: Socially Isolated   Frequency of Communication with Friends and Family: More than three times a week   Frequency of Social Gatherings with Friends and Family: Once a week   Attends Religious Services: Never   Marine scientist or Organizations: No   Attends Music therapist: Never   Marital Status: Divorced    Tobacco Counseling Ready to quit: No Counseling given: Not Answered Tobacco comments: He started at 89, stopped for over 10 years but is smoking again and not ready to quit    Clinical Intake:  Pre-visit preparation completed: Yes  Pain : No/denies pain     Nutritional Risks: None Diabetes: No  How often do you need to have someone help you when you read instructions, pamphlets, or other written materials from your doctor or pharmacy?: 1 - Never    Interpreter Needed?: No  Information entered by :: Clemetine Marker  LPN   Activities of Daily Living In your present state of health, do you have any difficulty performing the following activities: 08/22/2021 06/27/2021  Hearing? N N  Vision? N N  Difficulty concentrating or making decisions? N N  Walking or climbing stairs? Y Y  Dressing or bathing? N N  Doing errands, shopping? N N  Preparing Food and eating ? N -  Using the Toilet? N -  In the past six months, have you accidently leaked urine? N -  Do you have problems with loss of bowel control? N -  Managing your Medications? N -  Managing your Finances? N -  Housekeeping or managing your Housekeeping? N -  Some recent data might be hidden    Patient Care Team: Steele Sizer, MD as  PCP - General (Family Medicine) [name], MD as Consulting Physician (Pain Medicine)  Indicate any recent Medical Services you may have received from other than [facility] providers in the past year (date may be approximate).     Assessment:   This is a routine wellness examination for [facility].  Hearing/Vision screen Hearing Screening - Comments::  Pt denies hearing difficulty Vision Screening - Comments:: Past due for eye exam; declines referral  Dietary issues and exercise activities discussed: Current Exercise Habits: The patient does not participate in regular exercise at present, Exercise limited by: respiratory conditions(s)   Goals Addressed             This Visit's Progress    DIET - INCREASE WATER INTAKE   On track    Recommend to drink at least 6-8 8oz glasses of water per day.     Eat more fruits and vegetables   On track    Recommend eating more vegetables in the daily diet.        Depression Screen PHQ 2/9 Scores 08/22/2021 06/27/2021 04/19/2021 12/01/2020 06/02/2020 06/02/2020 11/30/2019  PHQ - 2 Score 0 4 0 1 0 2 1  PHQ- 9 Score 0 4 0 3 0 3 1    Fall Risk Fall Risk  08/22/2021 06/27/2021 04/19/2021 12/01/2020 06/02/2020  Falls in the past year? 0 0 0 0 1  Number falls in past yr: 0 0 0 0 0   Injury with Fall? 0 0 0 0 1  Risk Factor Category  - - - - -  Risk for fall due to : No Fall Risks No Fall Risks - - No Fall Risks  Risk for fall due to: Comment - - - - -  Follow up Falls prevention discussed Falls prevention discussed - - Falls prevention discussed    FALL RISK PREVENTION PERTAINING TO THE HOME:  Any stairs in or around the home? No  If so, are there any without handrails? No  Home free of loose throw rugs in walkways, pet beds, electrical cords, etc? Yes  Adequate lighting in your home to reduce risk of falls? Yes   ASSISTIVE DEVICES UTILIZED TO PREVENT FALLS:  Life alert? No  Use of a cane, walker or w/c? No  Grab bars in the bathroom? No  Shower chair or bench in shower? No  Elevated toilet seat or a handicapped toilet? No   TIMED UP AND GO:  Was the test performed? No .  Telephonic visit.   Cognitive Function: Normal cognitive status assessed by direct observation by this Nurse Health Advisor. No abnormalities found.       6CIT Screen 04/09/2019 04/01/2018 01/21/2017  What Year? 0 points 0 points 0 points  What month? 0 points 0 points 0 points  What time? 0 points 0 points 0 points  Count back from 20 0 points 0 points 0 points  Months in reverse 0 points 0 points 4 points  Repeat phrase 2 points 6 points 2 points  Total Score 2 6 6     Immunizations Immunization History  Administered Date(s) Administered   Influenza Inj Mdck Quad Pf 07/28/2019   Influenza,inj,Quad PF,6+ Mos 07/01/2014, 09/02/2015, 06/19/2017, 07/14/2018, 06/02/2020, 06/27/2021   Influenza-Unspecified 07/01/2014   Pneumococcal Polysaccharide-23 07/31/2013   Tdap 08/02/2014, 09/01/2015    TDAP status: Up to date  Flu Vaccine status: Up to date  Pneumococcal vaccine status: Up to date  Covid-19 vaccine status: Declined, Education has been provided regarding the importance of this vaccine but  patient still declined. Advised may receive this vaccine at local pharmacy or Health  Dept.or vaccine clinic. Aware to provide a copy of the vaccination record if obtained from local pharmacy or Health Dept. Verbalized acceptance and understanding.  Qualifies for Shingles Vaccine? Yes   Zostavax completed No   Shingrix Completed?: No.    Education has been provided regarding the importance of this vaccine. Patient has been advised to call insurance company to determine out of pocket expense if they have not yet received this vaccine. Advised may also receive vaccine at local pharmacy or Health Dept. Verbalized acceptance and understanding.  Screening Tests Health Maintenance  Topic Date Due   COVID-19 Vaccine (1) Never done   Zoster Vaccines- Shingrix (1 of 2) Never done   Pneumococcal Vaccine 20-50 Years old (2 - PCV) 07/31/2014   Fecal DNA (Cologuard)  06/29/2023   TETANUS/TDAP  08/31/2025   INFLUENZA VACCINE  Completed   Hepatitis C Screening  Completed   HIV Screening  Completed   HPV VACCINES  Aged Out    Health Maintenance  Health Maintenance Due  Topic Date Due   COVID-19 Vaccine (1) Never done   Zoster Vaccines- Shingrix (1 of 2) Never done   Pneumococcal Vaccine 54-4 Years old (2 - PCV) 07/31/2014    Colorectal cancer screening: Type of screening: Cologuard. Completed 06/28/20. Repeat every 3 years  Lung Cancer Screening: (Low Dose CT Chest recommended if Age 57-80 years, 30 pack-year currently smoking OR have quit w/in 15years.) does qualify. Pt declines screening at this time.    Additional Screening:  Hepatitis C Screening: does qualify; 02/20/19  Vision Screening: Recommended annual ophthalmology exams for early detection of glaucoma and other disorders of the eye. Is the patient up to date with their annual eye exam?  No  Who is the provider or what is the name of the office in which the patient attends annual eye exams? Not established  If pt is not established with a provider, would they like to be referred to a provider to establish care? No .    Dental Screening: Recommended annual dental exams for proper oral hygiene  Community Resource Referral / Chronic Care Management: CRR required this visit?  No   CCM required this visit?  No      Plan:     I have personally reviewed and noted the following in the patient's chart:   Medical and social history Use of alcohol, tobacco or illicit drugs  Current medications and supplements including opioid prescriptions. Patient is not currently taking opioid prescriptions. Functional ability and status Nutritional status Physical activity Advanced directives List of other physicians Hospitalizations, surgeries, and ER visits in previous 12 months Vitals Screenings to include cognitive, depression, and falls Referrals and appointments  In addition, I have reviewed and discussed with patient certain preventive protocols, quality metrics, and best practice recommendations. A written personalized care plan for preventive services as well as general preventive health recommendations were provided to patient.     Clemetine Marker, LPN   [id number]   Nurse Notes: none

## 2021-08-22 NOTE — Patient Instructions (Signed)
[name] , Thank you for taking time to come for your Medicare Wellness Visit. I appreciate your ongoing commitment to your health goals. Please review the following plan we discussed and let me know if I can assist you in the future.   Screening recommendations/referrals: Colonoscopy: Cologuard done 06/28/20. Repeat 06/2023 Recommended yearly ophthalmology/optometry visit for glaucoma screening and checkup Recommended yearly dental visit for hygiene and checkup  Vaccinations: Influenza vaccine: done 06/27/21 Pneumococcal vaccine: done 07/31/13 Tdap vaccine: done 09/01/15 Shingles vaccine: Shingrix discussed. Please contact your pharmacy for coverage information.  Covid-19: declined  Advanced directives: Advance directive discussed with you today. I have provided a copy for you to complete at home and have notarized. Once this is complete please bring a copy in to our office so we can scan it into your chart.   Conditions/risks identified: If you wish to quit smoking, help is available. For free tobacco cessation program offerings call the [facility] at (806)803-4623 or Live Well Line at (413)315-5521. You may also visit [email or web address] or email [email or web address] for more information on other programs.   Next appointment: Follow up in one year for your annual wellness visit   Preventive Care 40-64 Years, Male Preventive care refers to lifestyle choices and visits with your health care provider that can promote health and wellness. What does preventive care include? A yearly physical exam. This is also called an annual well check. Dental exams once or twice a year. Routine eye exams. Ask your health care provider how often you should have your eyes checked. Personal lifestyle choices, including: Daily care of your teeth and gums. Regular physical activity. Eating a healthy diet. Avoiding tobacco and drug use. Limiting alcohol use. Practicing safe  sex. Taking low-dose aspirin every day starting at age 51. What happens during an annual well check? The services and screenings done by your health care provider during your annual well check will depend on your age, overall health, lifestyle risk factors, and family history of disease. Counseling  Your health care provider may ask you questions about your: Alcohol use. Tobacco use. Drug use. Emotional well-being. Home and relationship well-being. Sexual activity. Eating habits. Work and work Statistician. Screening  You may have the following tests or measurements: Height, weight, and BMI. Blood pressure. Lipid and cholesterol levels. These may be checked every 5 years, or more frequently if you are over 73 years old. Skin check. Lung cancer screening. You may have this screening every year starting at age 68 if you have a 30-pack-year history of smoking and currently smoke or have quit within the past 15 years. Fecal occult blood test (FOBT) of the stool. You may have this test every year starting at age 66. Flexible sigmoidoscopy or colonoscopy. You may have a sigmoidoscopy every 5 years or a colonoscopy every 10 years starting at age 14. Prostate cancer screening. Recommendations will vary depending on your family history and other risks. Hepatitis C blood test. Hepatitis B blood test. Sexually transmitted disease (STD) testing. Diabetes screening. This is done by checking your blood sugar (glucose) after you have not eaten for a while (fasting). You may have this done every 1-3 years. Discuss your test results, treatment options, and if necessary, the need for more tests with your health care provider. Vaccines  Your health care provider may recommend certain vaccines, such as: Influenza vaccine. This is recommended every year. Tetanus, diphtheria, and acellular pertussis (Tdap, Td) vaccine. You may need a Td booster every 10  years. Zoster vaccine. You may need this after age  91. Pneumococcal 13-valent conjugate (PCV13) vaccine. You may need this if you have certain conditions and have not been vaccinated. Pneumococcal polysaccharide (PPSV23) vaccine. You may need one or two doses if you smoke cigarettes or if you have certain conditions. Talk to your health care provider about which screenings and vaccines you need and how often you need them. This information is not intended to replace advice given to you by your health care provider. Make sure you discuss any questions you have with your health care provider. Document Released: 10/14/2015 Document Revised: 06/06/2016 Document Reviewed: 07/19/2015 Elsevier Interactive Patient Education  2017 [place] Prevention in the Home Falls can cause injuries. They can happen to people of all ages. There are many things you can do to make your home safe and to help prevent falls. What can I do on the outside of my home? Regularly fix the edges of walkways and driveways and fix any cracks. Remove anything that might make you trip as you walk through a door, such as a raised step or threshold. Trim any bushes or trees on the path to your home. Use bright outdoor lighting. Clear any walking paths of anything that might make someone trip, such as rocks or tools. Regularly check to see if handrails are loose or broken. Make sure that both sides of any steps have handrails. Any raised decks and porches should have guardrails on the edges. Have any leaves, snow, or ice cleared regularly. Use sand or salt on walking paths during winter. Clean up any spills in your garage right away. This includes oil or grease spills. What can I do in the bathroom? Use night lights. Install grab bars by the toilet and in the tub and shower. Do not use towel bars as grab bars. Use non-skid mats or decals in the tub or shower. If you need to sit down in the shower, use a plastic, non-slip stool. Keep the floor dry. Clean up any water  that spills on the floor as soon as it happens. Remove soap buildup in the tub or shower regularly. Attach bath mats securely with double-sided non-slip rug tape. Do not have throw rugs and other things on the floor that can make you trip. What can I do in the bedroom? Use night lights. Make sure that you have a light by your bed that is easy to reach. Do not use any sheets or blankets that are too big for your bed. They should not hang down onto the floor. Have a firm chair that has side arms. You can use this for support while you get dressed. Do not have throw rugs and other things on the floor that can make you trip. What can I do in the kitchen? Clean up any spills right away. Avoid walking on wet floors. Keep items that you use a lot in easy-to-reach places. If you need to reach something above you, use a strong step stool that has a grab bar. Keep electrical cords out of the way. Do not use floor polish or wax that makes floors slippery. If you must use wax, use non-skid floor wax. Do not have throw rugs and other things on the floor that can make you trip. What can I do with my stairs? Do not leave any items on the stairs. Make sure that there are handrails on both sides of the stairs and use them. Fix handrails that are broken or  loose. Make sure that handrails are as long as the stairways. Check any carpeting to make sure that it is firmly attached to the stairs. Fix any carpet that is loose or worn. Avoid having throw rugs at the top or bottom of the stairs. If you do have throw rugs, attach them to the floor with carpet tape. Make sure that you have a light switch at the top of the stairs and the bottom of the stairs. If you do not have them, ask someone to add them for you. What else can I do to help prevent falls? Wear shoes that: Do not have high heels. Have rubber bottoms. Are comfortable and fit you well. Are closed at the toe. Do not wear sandals. If you use a  stepladder: Make sure that it is fully opened. Do not climb a closed stepladder. Make sure that both sides of the stepladder are locked into place. Ask someone to hold it for you, if possible. Clearly mark and make sure that you can see: Any grab bars or handrails. First and last steps. Where the edge of each step is. Use tools that help you move around (mobility aids) if they are needed. These include: Canes. Walkers. Scooters. Crutches. Turn on the lights when you go into a dark area. Replace any light bulbs as soon as they burn out. Set up your furniture so you have a clear path. Avoid moving your furniture around. If any of your floors are uneven, fix them. If there are any pets around you, be aware of where they are. Review your medicines with your doctor. Some medicines can make you feel dizzy. This can increase your chance of falling. Ask your doctor what other things that you can do to help prevent falls. This information is not intended to replace advice given to you by your health care provider. Make sure you discuss any questions you have with your health care provider. Document Released: 07/14/2009 Document Revised: 02/23/2016 Document Reviewed: 10/22/2014 Elsevier Interactive Patient Education  2017 [employer].

## 2021-11-18 IMAGING — DX DG ELBOW 2V*L*
2 series · 2 of 2 positions shown · non-contrast
Comparison: February 08, 2020 study obtained earlier in the day

CLINICAL DATA: Post reduction for elbow dislocation

EXAM:
LEFT ELBOW - 2 VIEW

[elbow ap]
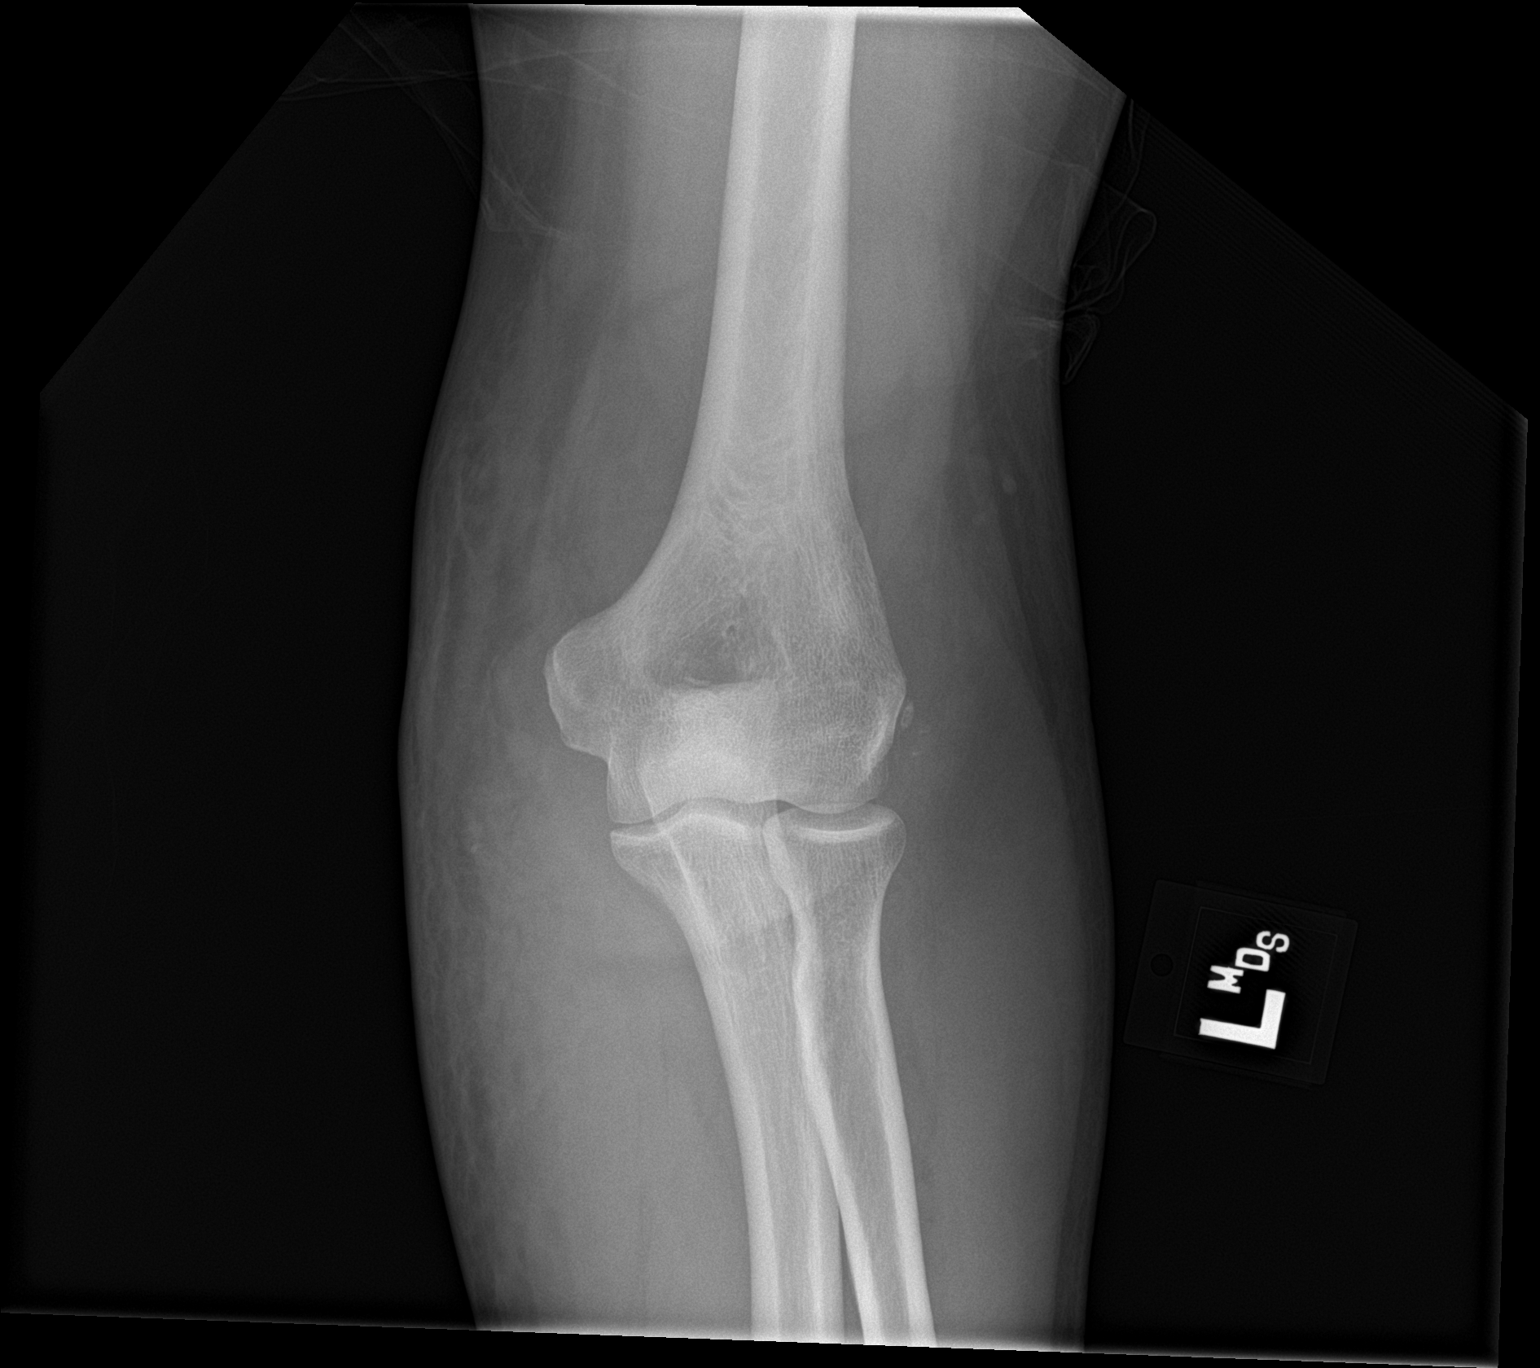

[elbow lat]
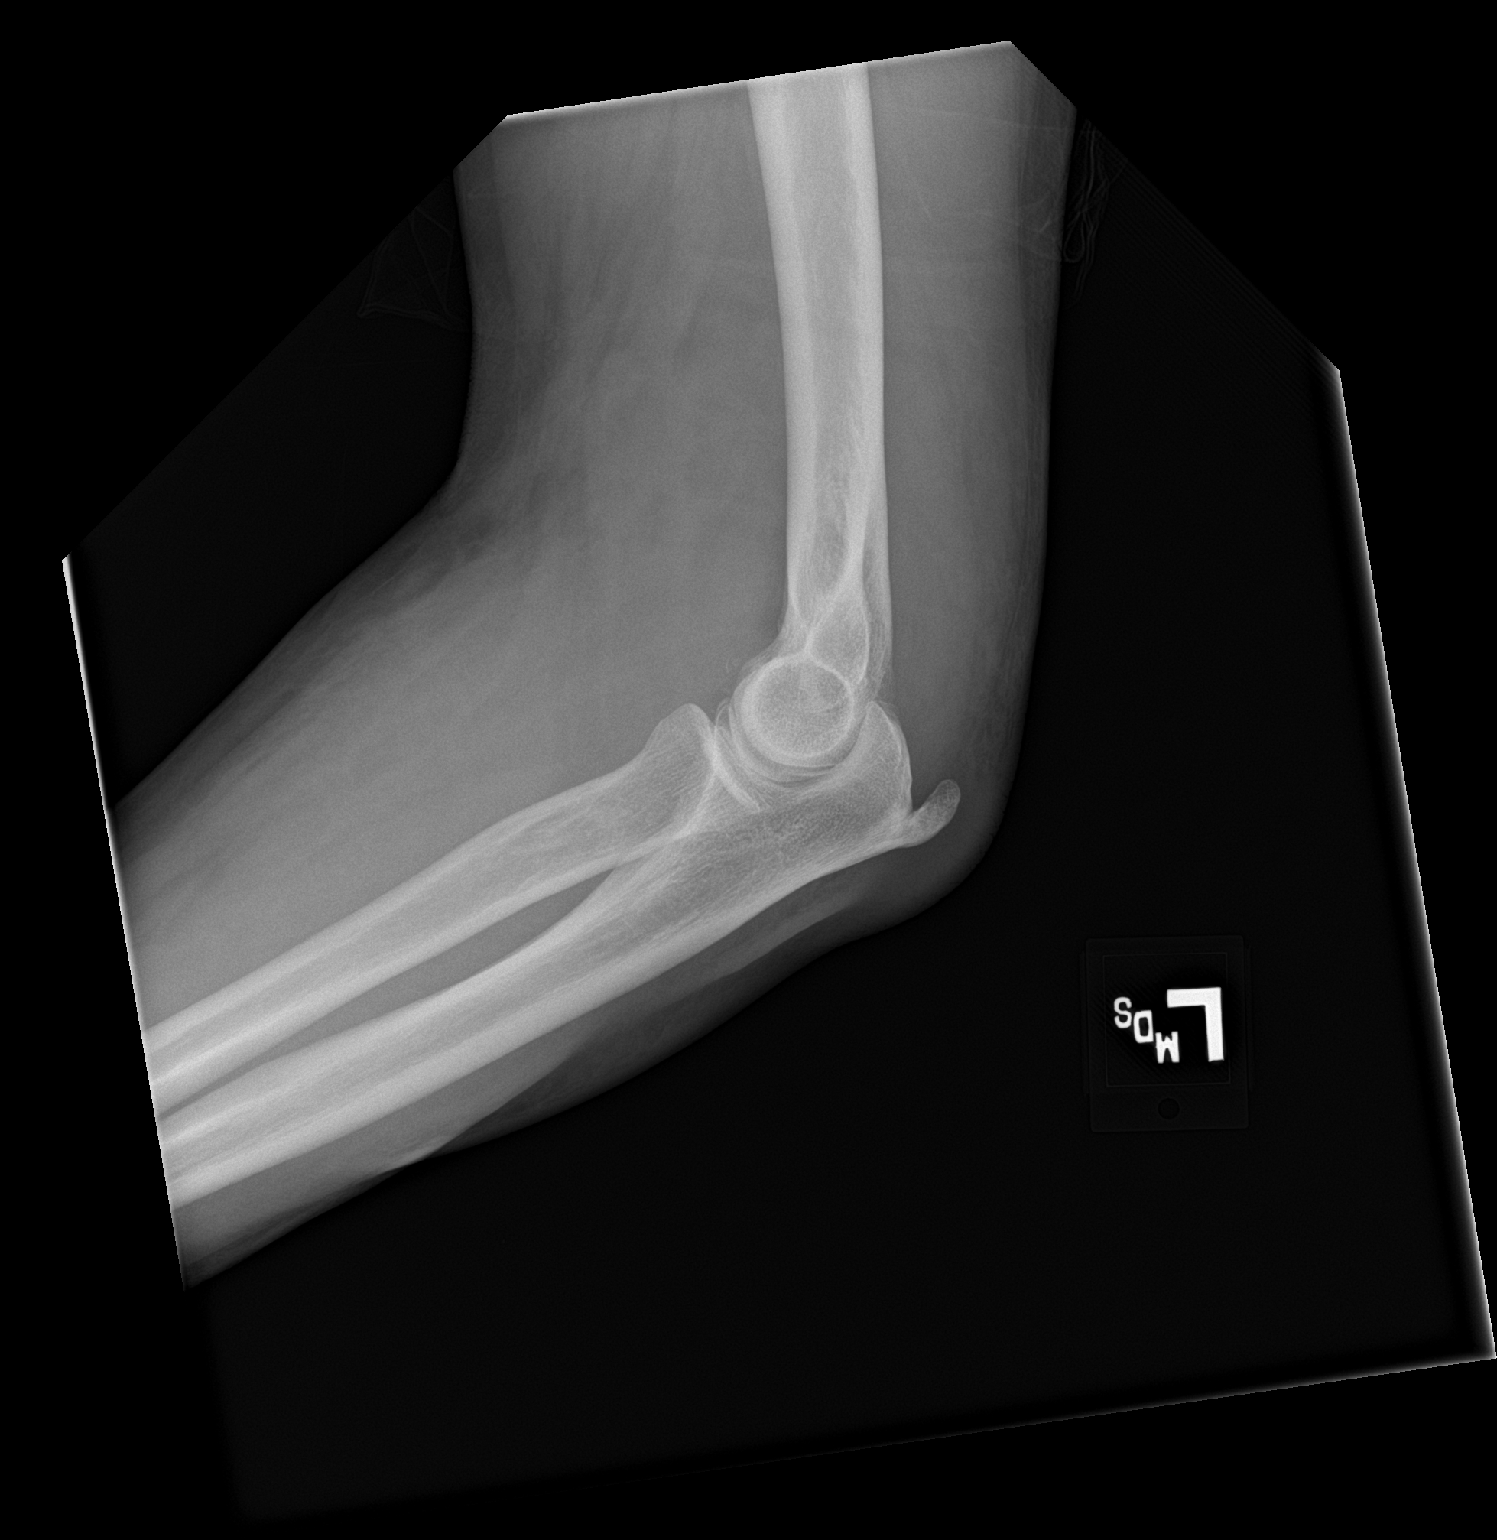

[2 of 2 positions shown; findings below may reference images not displayed]

FINDINGS: Frontal and lateral views were obtained. The previously noted elbow
dislocation has been reduced successfully. Several tiny
calcifications are noted in the anterolateral aspect of the joint,
slightly due to avulsion is a turning at the time of the
dislocation. No larger fracture evident. There is a prominent spur
arising from the olecranon process of the proximal ulna. No
appreciable joint space narrowing or erosion.
IMPRESSION: Successful reduction of dislocation at the elbow joint. Several
other tiny bony fragments noted antral laterally, likely avulsion
type injuries. No larger fracture evident. Prominent spur arising
from the olecranon process of the proximal ulna.
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
## 2021-12-26 ENCOUNTER — Ambulatory Visit (INDEPENDENT_AMBULATORY_CARE_PROVIDER_SITE_OTHER): Payer: Medicare PPO | Admitting: Family Medicine

## 2021-12-26 ENCOUNTER — Encounter: Payer: Self-pay | Admitting: Family Medicine

## 2021-12-26 ENCOUNTER — Other Ambulatory Visit: Payer: Self-pay

## 2021-12-26 VITALS — BP 114/66 | HR 80 | Temp 97.8°F | Resp 16 | Ht 68.0 in | Wt 242.0 lb

## 2021-12-26 DIAGNOSIS — F330 Major depressive disorder, recurrent, mild: Secondary | ICD-10-CM

## 2021-12-26 DIAGNOSIS — N528 Other male erectile dysfunction: Secondary | ICD-10-CM

## 2021-12-26 DIAGNOSIS — F411 Generalized anxiety disorder: Secondary | ICD-10-CM | POA: Diagnosis not present

## 2021-12-26 DIAGNOSIS — J410 Simple chronic bronchitis: Secondary | ICD-10-CM

## 2021-12-26 DIAGNOSIS — E8881 Metabolic syndrome: Secondary | ICD-10-CM

## 2021-12-26 DIAGNOSIS — R739 Hyperglycemia, unspecified: Secondary | ICD-10-CM

## 2021-12-26 DIAGNOSIS — I2510 Atherosclerotic heart disease of native coronary artery without angina pectoris: Secondary | ICD-10-CM | POA: Diagnosis not present

## 2021-12-26 DIAGNOSIS — Z23 Encounter for immunization: Secondary | ICD-10-CM

## 2021-12-26 DIAGNOSIS — M5416 Radiculopathy, lumbar region: Secondary | ICD-10-CM

## 2021-12-26 DIAGNOSIS — E782 Mixed hyperlipidemia: Secondary | ICD-10-CM

## 2022-04-10 ENCOUNTER — Other Ambulatory Visit: Payer: Self-pay | Admitting: Family Medicine

## 2022-04-10 DIAGNOSIS — F411 Generalized anxiety disorder: Secondary | ICD-10-CM

## 2022-04-10 DIAGNOSIS — F330 Major depressive disorder, recurrent, mild: Secondary | ICD-10-CM

## 2022-04-10 DIAGNOSIS — M5416 Radiculopathy, lumbar region: Secondary | ICD-10-CM

## 2022-04-10 DIAGNOSIS — E782 Mixed hyperlipidemia: Secondary | ICD-10-CM
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
## 2022-06-29 ENCOUNTER — Encounter: Payer: Self-pay | Admitting: Family Medicine

## 2022-06-29 ENCOUNTER — Ambulatory Visit (INDEPENDENT_AMBULATORY_CARE_PROVIDER_SITE_OTHER): Payer: Medicare PPO | Admitting: Family Medicine

## 2022-06-29 VITALS — BP 132/84 | HR 80 | Temp 98.2°F | Resp 16 | Ht 68.0 in | Wt 242.7 lb

## 2022-06-29 DIAGNOSIS — J410 Simple chronic bronchitis: Secondary | ICD-10-CM | POA: Diagnosis not present

## 2022-06-29 DIAGNOSIS — M79671 Pain in right foot: Secondary | ICD-10-CM

## 2022-06-29 DIAGNOSIS — E782 Mixed hyperlipidemia: Secondary | ICD-10-CM | POA: Diagnosis not present

## 2022-06-29 DIAGNOSIS — I2510 Atherosclerotic heart disease of native coronary artery without angina pectoris: Secondary | ICD-10-CM

## 2022-06-29 DIAGNOSIS — E8881 Metabolic syndrome: Secondary | ICD-10-CM | POA: Diagnosis not present

## 2022-06-29 DIAGNOSIS — M5416 Radiculopathy, lumbar region: Secondary | ICD-10-CM

## 2022-06-29 DIAGNOSIS — F411 Generalized anxiety disorder: Secondary | ICD-10-CM | POA: Diagnosis not present

## 2022-06-29 DIAGNOSIS — Z23 Encounter for immunization: Secondary | ICD-10-CM

## 2022-06-29 DIAGNOSIS — Z79899 Other long term (current) drug therapy: Secondary | ICD-10-CM

## 2022-06-29 DIAGNOSIS — F330 Major depressive disorder, recurrent, mild: Secondary | ICD-10-CM

## 2022-06-29 DIAGNOSIS — M79672 Pain in left foot: Secondary | ICD-10-CM

## 2022-06-29 DIAGNOSIS — D171 Benign lipomatous neoplasm of skin and subcutaneous tissue of trunk: Secondary | ICD-10-CM

## 2022-06-29 MED ORDER — PREGABALIN 50 MG PO CAPS: 50.0000 mg | ORAL_CAPSULE | Freq: Three times a day (TID) | ORAL | 0 refills | Status: DC

## 2022-06-29 NOTE — Patient Instructions (Addendum)
Start lyrica at night and if tolerated may take up to 3 times a day, max dose is 100 mg three times a day   Get RSV and covid booster at local pharmacy

## 2022-06-30 LAB — COMPLETE METABOLIC PANEL WITH GFR
AG Ratio: 1.8 (calc) (ref 1.0–2.5)
ALT: 47 U/L — ABNORMAL HIGH (ref 9–46)
AST: 29 U/L (ref 10–35)
Albumin: 4.4 g/dL (ref 3.6–5.1)
Alkaline phosphatase (APISO): 51 U/L (ref 35–144)
BUN: 12 mg/dL (ref 7–25)
CO2: 25 mmol/L (ref 20–32)
Calcium: 9.3 mg/dL (ref 8.6–10.3)
Chloride: 107 mmol/L (ref 98–110)
Creat: 0.8 mg/dL (ref 0.70–1.35)
Globulin: 2.5 g/dL (calc) (ref 1.9–3.7)
Glucose, Bld: 94 mg/dL (ref 65–99)
Potassium: 4.9 mmol/L (ref 3.5–5.3)
Sodium: 139 mmol/L (ref 135–146)
Total Bilirubin: 0.4 mg/dL (ref 0.2–1.2)
Total Protein: 6.9 g/dL (ref 6.1–8.1)
eGFR: 101 mL/min/{1.73_m2} (ref >=60)

## 2022-06-30 LAB — CBC WITH DIFFERENTIAL/PLATELET
Absolute Monocytes: 858 cells/uL (ref 200–950)
Basophils Absolute: 59 cells/uL (ref 0–200)
Basophils Relative: 0.8 %
Eosinophils Absolute: 118 cells/uL (ref 15–500)
Eosinophils Relative: 1.6 %
HCT: 51.3 % — ABNORMAL HIGH (ref 38.5–50.0)
Hemoglobin: 17.4 g/dL — ABNORMAL HIGH (ref 13.2–17.1)
Lymphs Abs: 2405 cells/uL (ref 850–3900)
MCH: 31 pg (ref 27.0–33.0)
MCHC: 33.9 g/dL (ref 32.0–36.0)
MCV: 91.4 fL (ref 80.0–100.0)
MPV: 10.9 fL (ref 7.5–12.5)
Monocytes Relative: 11.6 %
Neutro Abs: 3959 cells/uL (ref 1500–7800)
Neutrophils Relative %: 53.5 %
Platelets: 198 10*3/uL (ref 140–400)
RBC: 5.61 10*6/uL (ref 4.20–5.80)
RDW: 12.7 % (ref 11.0–15.0)
Total Lymphocyte: 32.5 %
WBC: 7.4 10*3/uL (ref 3.8–10.8)

## 2022-06-30 LAB — LIPID PANEL
Cholesterol: 136 mg/dL (ref <200)
HDL: 29 mg/dL — ABNORMAL LOW (ref >=40)
LDL Cholesterol (Calc): 79 mg/dL (calc)
Non-HDL Cholesterol (Calc): 107 mg/dL (calc) (ref <130)
Total CHOL/HDL Ratio: 4.7 (calc) (ref <5.0)
Triglycerides: 186 mg/dL — ABNORMAL HIGH (ref <150)

## 2022-07-31 ENCOUNTER — Encounter: Payer: Self-pay | Admitting: Surgery

## 2022-07-31 ENCOUNTER — Ambulatory Visit: Payer: Medicare PPO | Admitting: Surgery

## 2022-07-31 VITALS — BP 121/83 | HR 75 | Temp 98.3°F | Ht 67.0 in | Wt 246.0 lb

## 2022-07-31 DIAGNOSIS — R222 Localized swelling, mass and lump, trunk: Secondary | ICD-10-CM

## 2022-07-31 NOTE — Progress Notes (Signed)
Patient ID: John Garrett, male   DOB: October 29, 1960, 61 y.o.   MRN: [id number]  Chief Complaint: Right chest wall mass.  History of Present Illness John Garrett is a 61 y.o. male with the above, present for many years.  Had prior imaging long ago which did not show any lesion in the area.  Has increased in size over the past year and has been more of a concern for him.  Denies significant pain but can be tender at times depending on positioning.  He would like to pursue its removal.  I assured him this is consistent with a benign fatty mass.  He is dealing with some chronic back pain as well.  Past Medical History Past Medical History:  Diagnosis Date   Anxiety    Chronic back pain    Chronic pain    Depression    Hyperlipidemia    Prediabetes 01/08/2018      Past Surgical History:  Procedure Laterality Date   CATARACT EXTRACTION Right 2014   NASAL SINUS SURGERY  2004   deviated septum    No Known Allergies  Current Outpatient Medications  Medication Sig Dispense Refill   buPROPion (WELLBUTRIN XL) 300 MG 24 hr tablet TAKE 1 TABLET EVERY DAY 90 tablet 1   busPIRone (BUSPAR) 30 MG tablet TAKE 1 TABLET EVERY DAY 90 tablet 1   celecoxib (CELEBREX) 200 MG capsule TAKE 1 CAPSULE EVERY DAY 90 capsule 1   citalopram (CELEXA) 40 MG tablet TAKE 1 TABLET EVERY DAY 90 tablet 1   ezetimibe (ZETIA) 10 MG tablet TAKE 1 TABLET EVERY DAY 90 tablet 1   hydrOXYzine (ATARAX) 25 MG tablet TAKE 1 TABLET EVERY DAY 90 tablet 1   pregabalin (LYRICA) 50 MG capsule Take 1 capsule (50 mg total) by mouth 3 (three) times daily. 270 capsule 0   rosuvastatin (CRESTOR) 40 MG tablet TAKE 1 TABLET EVERY DAY 90 tablet 1   sildenafil (VIAGRA) 100 MG tablet Take 0.5-1 tablets (50-100 mg total) by mouth daily as needed for erectile dysfunction. 30 tablet 0   tiZANidine (ZANAFLEX) 4 MG tablet TAKE 1 TABLET TWICE DAILY AS NEEDED FOR MUSCLE SPASM(S) 180 tablet 1   No current facility-administered medications for this  visit.    Family History Family History  Adopted: Yes  Family history unknown: Yes      Social History Social History   Tobacco Use   Smoking status: Every Day    Packs/day: 1.00    Years: 25.00    Total pack years: 25.00    Types: Cigarettes    Start date: 1978   Smokeless tobacco: Former    Types: Chew, Snuff    Quit date: 2001   Tobacco comments:    He started at 65, stopped for over 10 years but is smoking again and is trying to cut down on amount    Vaping Use   Vaping Use: Former  Substance Use Topics   Alcohol use: Yes    Alcohol/week: 16.0 standard drinks of alcohol    Types: 4 Glasses of wine, 12 Cans of beer per week   Drug use: No        Review of Systems  Constitutional: Negative.   HENT: Negative.    Eyes: Negative.   Respiratory: Negative.    Cardiovascular: Negative.   Gastrointestinal: Negative.   Genitourinary: Negative.   Musculoskeletal:  Positive for back pain.  Skin: Negative.   Neurological: Negative.   Psychiatric/Behavioral:  Positive for depression.  Physical Exam Blood pressure 121/83, pulse 75, temperature 98.3 F (36.8 C), height '5\' 7"'$  (1.702 m), weight 246 lb (111.6 kg), SpO2 98 %. Last Weight  Most recent update: 07/31/2022  9:49 AM    Weight  111.6 kg (246 lb)             CONSTITUTIONAL: Well developed, and nourished, appropriately responsive and aware without distress.   EYES: Sclera non-icteric.   EARS, NOSE, MOUTH AND THROAT:  The oropharynx is clear. Oral mucosa is pink and moist.     Hearing is intact to voice.  NECK: Trachea is midline, and there is no jugular venous distension.  LYMPH NODES:  Lymph nodes in the neck are not appreciated.  Fully bearded gentleman RESPIRATORY:  Lungs are clear, and breath sounds are equal bilaterally. Normal respiratory effort without pathologic use of accessory muscles. CARDIOVASCULAR: Heart is regular in rate and rhythm. GI: The abdomen is soft, nontender, and  nondistended.  MUSCULOSKELETAL:  Symmetrical muscle tone appreciated in all four extremities.    SKIN: Skin turgor is normal. No pathologic skin lesions appreciated.  Along the inferior aspect of the right chest wall there is a subcutaneous soft tissue mass consistent with a lipoma.  It is oval in shape with its long axis running along the right costal margin.  Estimated over 10 cm in length, possibly for 5 cm and cephalad to caudad width.  Approximately  3+ cm thick. NEUROLOGIC:  Motor and sensation appear grossly normal.  Cranial nerves are grossly without defect. PSYCH:  Alert and oriented to person, place and time. Affect is appropriate for situation.  Data Reviewed I have personally reviewed what is currently available of the patient's imaging, recent labs and medical records.   Labs:     Latest Ref Rng & Units 06/29/2022   11:03 AM 06/27/2021    2:16 PM 06/02/2020   11:58 AM  CBC  WBC 3.8 - 10.8 Thousand/uL 7.4  8.6  10.6   Hemoglobin 13.2 - 17.1 g/dL 17.4  16.5  17.6   Hematocrit 38.5 - 50.0 % 51.3  49.0  51.5   Platelets 140 - 400 Thousand/uL 198  268  293       Latest Ref Rng & Units 06/29/2022   11:03 AM 06/27/2021    2:16 PM 06/02/2020   11:58 AM  CMP  Glucose 65 - 99 mg/dL 94  90  78   BUN 7 - 25 mg/dL '12  19  14   '$ Creatinine 0.70 - 1.35 mg/dL 0.80  0.89  1.09   Sodium 135 - 146 mmol/L 139  137  137   Potassium 3.5 - 5.3 mmol/L 4.9  4.6  5.1   Chloride 98 - 110 mmol/L 107  104  104   CO2 20 - 32 mmol/L '25  24  22   '$ Calcium 8.6 - 10.3 mg/dL 9.3  9.6  10.1   Total Protein 6.1 - 8.1 g/dL 6.9  7.2  7.7   Total Bilirubin 0.2 - 1.2 mg/dL 0.4  0.6  0.6   AST 10 - 35 U/L '29  24  27   '$ ALT 9 - 46 U/L 47  34  46       Imaging: Radiological images reviewed:   Within last 24 hrs: No results found.  Assessment    Soft tissue mass right flank/costal margin. Patient Active Problem List   Diagnosis Date Noted   Coronary artery disease involving native coronary artery of native  heart  without angina pectoris 04/19/2021   Smokers' cough ([place]) 04/19/2021   Chronic low back pain (Primary Area of Pain) (Bilateral) (R>L) w/ sciatica (Right) 03/26/2018   Lumbar facet joint syndrome (Bilateral) (R>L) 03/26/2018   Spondylosis without myelopathy or radiculopathy, lumbosacral region 03/26/2018   Chronic lower extremity pain (Secondary Area of Pain) (Bilateral) (R>L) 03/26/2018   Chronic radicular pain of lower extremity (S1 dermatome) (Right) 03/26/2018   Chronic neck pain (Tertiary Area of Pain) (Bilateral) (R>L) 03/26/2018   Cervicogenic headache (Right) 03/26/2018   Unilateral occipital headache (Right) 03/26/2018   Cervico-occipital neuralgia (Fourth Area of Pain) (Right) 03/26/2018   Chronic shoulder pain (Fifth Area of Pain) (Left) 03/26/2018   Numbness and tingling of arm (Right) 03/26/2018   Cervical radiculitis (C7/C8) (Right) 03/26/2018   Osteoarthritis of spine with radiculopathy, lumbar region (S1) (Right) 03/26/2018   DDD (degenerative disc disease), lumbar 03/26/2018   Lumbar facet arthropathy (Bilateral) 03/26/2018   DDD (degenerative disc disease), thoracic 03/26/2018   Thoracic Levoscoliosis 03/26/2018   Cervical foraminal stenosis (C3-4, C5-6) (Left) 03/26/2018   DDD (degenerative disc disease), cervical 03/26/2018   Cervicalgia 03/26/2018   Chronic pain syndrome 01/15/2018   Lumbar spondylosis 01/15/2018   Prediabetes 01/08/2018   Nocturnal leg cramps 02/19/2017   Polypharmacy 09/02/2015   Dyslipidemia 08/04/2015   Morbid obesity ([place]) 07/04/2015   Chronic lumbar radiculopathy (S1) (Right) 03/08/2015   GAD (generalized anxiety disorder) 03/08/2015   Major depression, chronic 03/08/2015    Plan    I believe he is highly motivated and desires to pursue getting this removed under local anesthesia.  I agree the risks of anesthesia prior not worthwhile.  We did discuss the possible exchange of the lump for a seroma or hematoma.  I believe he  understands the risk of infection as well as well as continuing to experience some pain or discomfort in the area. I believe his questions have been adequately answered, he desires to proceed.  Face-to-face time spent with the patient and accompanying care providers(if present) was 30 minutes, with more than 50% of the time spent counseling, educating, and coordinating care of the patient.    These notes generated with voice recognition software. I apologize for typographical errors.  Ronny Bacon M.D., FACS 07/31/2022, 10:04 AM

## 2022-07-31 NOTE — Patient Instructions (Addendum)
We have seen you today in our office for a Lipoma. We will schedule you for excision of this area in our office in December. This will be done with local anesthesia so you may drive yourself.   If this area is bothering you or is painful, it is pretty easily be removed and most often can be done during a procedure visit in our office. If you would liked this removed at anytime, please give [place] a call and we can place you on our schedule for a procedure visit.  If you have questions or concerns, please call and ask to speak with a nurse.    Lipoma A lipoma is a noncancerous (benign) tumor that is made up of fat cells. This is a very common type of soft-tissue growth. Lipomas are usually found under the skin (subcutaneous). They may occur in any tissue of the body that contains fat. Common areas for lipomas to appear include the back, shoulders, buttocks, and thighs. Lipomas grow slowly, and they are usually painless. Most lipomas do not cause problems and do not require treatment. What are the causes? The cause of this condition is not known. What increases the risk? This condition is more likely to develop in: People who are 106-49 years old. People who have a family history of lipomas.  What are the signs or symptoms? A lipoma usually appears as a small, round bump under the skin. It may feel soft or rubbery, but the firmness can vary. Most lipomas are not painful. However, a lipoma may become painful if it is located in an area where it pushes on nerves. How is this diagnosed? A lipoma can usually be diagnosed with a physical exam. You may also have tests to confirm the diagnosis and to rule out other conditions. Tests may include: Imaging tests, such as a CT scan or MRI. Removal of a tissue sample to be looked at under a microscope (biopsy).  How is this treated? Treatment is not needed for small lipomas that are not causing problems. If a lipoma continues to get bigger or it causes  problems, removal is often the best option. Lipomas can also be removed to improve appearance. Removal of a lipoma is usually done with a surgery in which the fatty cells and the surrounding capsule are removed. Most often, a medicine that numbs the area (local anesthetic) is used for this procedure. Follow these instructions at home: Keep all follow-up visits as directed by your health care provider. This is important. Contact a health care provider if: Your lipoma becomes larger or hard. Your lipoma becomes painful, red, or increasingly swollen. These could be signs of infection or a more serious condition. This information is not intended to replace advice given to you by your health care provider. Make sure you discuss any questions you have with your health care provider. Document Released: 09/07/2002 Document Revised: 02/23/2016 Document Reviewed: 09/13/2014 Elsevier Interactive Patient Education  [employer].

## 2022-08-28 ENCOUNTER — Ambulatory Visit (INDEPENDENT_AMBULATORY_CARE_PROVIDER_SITE_OTHER): Payer: Medicare PPO

## 2022-08-28 DIAGNOSIS — Z0000 Encounter for general adult medical examination without abnormal findings: Secondary | ICD-10-CM | POA: Diagnosis not present

## 2022-08-28 NOTE — Patient Instructions (Signed)
Health Maintenance, Male Adopting a healthy lifestyle and getting preventive care are important in promoting health and wellness. Ask your health care provider about: The right schedule for you to have regular tests and exams. Things you can do on your own to prevent diseases and keep yourself healthy. What should I know about diet, weight, and exercise? Eat a healthy diet  Eat a diet that includes plenty of vegetables, fruits, low-fat dairy products, and lean protein. Do not eat a lot of foods that are high in solid fats, added sugars, or sodium. Maintain a healthy weight Body mass index (BMI) is a measurement that can be used to identify possible weight problems. It estimates body fat based on height and weight. Your health care provider can help determine your BMI and help you achieve or maintain a healthy weight. Get regular exercise Get regular exercise. This is one of the most important things you can do for your health. Most adults should: Exercise for at least 150 minutes each week. The exercise should increase your heart rate and make you sweat (moderate-intensity exercise). Do strengthening exercises at least twice a week. This is in addition to the moderate-intensity exercise. Spend less time sitting. Even light physical activity can be beneficial. Watch cholesterol and blood lipids Have your blood tested for lipids and cholesterol at 61 years of age, then have this test every 5 years. You may need to have your cholesterol levels checked more often if: Your lipid or cholesterol levels are high. You are older than 61 years of age. You are at high risk for heart disease. What should I know about cancer screening? Many types of cancers can be detected early and may often be prevented. Depending on your health history and family history, you may need to have cancer screening at various ages. This may include screening for: Colorectal cancer. Prostate cancer. Skin cancer. Lung  cancer. What should I know about heart disease, diabetes, and high blood pressure? Blood pressure and heart disease High blood pressure causes heart disease and increases the risk of stroke. This is more likely to develop in people who have high blood pressure readings or are overweight. Talk with your health care provider about your target blood pressure readings. Have your blood pressure checked: Every 3-5 years if you are 18-39 years of age. Every year if you are 40 years old or older. If you are between the ages of 65 and 75 and are a current or former smoker, ask your health care provider if you should have a one-time screening for abdominal aortic aneurysm (AAA). Diabetes Have regular diabetes screenings. This checks your fasting blood sugar level. Have the screening done: Once every three years after age 45 if you are at a normal weight and have a low risk for diabetes. More often and at a younger age if you are overweight or have a high risk for diabetes. What should I know about preventing infection? Hepatitis B If you have a higher risk for hepatitis B, you should be screened for this virus. Talk with your health care provider to find out if you are at risk for hepatitis B infection. Hepatitis C Blood testing is recommended for: Everyone born from 1945 through 1965. Anyone with known risk factors for hepatitis C. Sexually transmitted infections (STIs) You should be screened each year for STIs, including gonorrhea and chlamydia, if: You are sexually active and are younger than 61 years of age. You are older than 61 years of age and your   health care provider tells you that you are at risk for this type of infection. Your sexual activity has changed since you were last screened, and you are at increased risk for chlamydia or gonorrhea. Ask your health care provider if you are at risk. Ask your health care provider about whether you are at high risk for HIV. Your health care provider  may recommend a prescription medicine to help prevent HIV infection. If you choose to take medicine to prevent HIV, you should first get tested for HIV. You should then be tested every 3 months for as long as you are taking the medicine. Follow these instructions at home: Alcohol use Do not drink alcohol if your health care provider tells you not to drink. If you drink alcohol: Limit how much you have to 0-2 drinks a day. Know how much alcohol is in your drink. In the [place] one drink equals one 12 oz bottle of beer (355 mL), one 5 oz glass of wine (148 mL), or one 1 oz glass of hard liquor (44 mL). Lifestyle Do not use any products that contain nicotine or tobacco. These products include cigarettes, chewing tobacco, and vaping devices, such as e-cigarettes. If you need help quitting, ask your health care provider. Do not use street drugs. Do not share needles. Ask your health care provider for help if you need support or information about quitting drugs. General instructions Schedule regular health, dental, and eye exams. Stay current with your vaccines. Tell your health care provider if: You often feel depressed. You have ever been abused or do not feel safe at home. Summary Adopting a healthy lifestyle and getting preventive care are important in promoting health and wellness. Follow your health care provider's instructions about healthy diet, exercising, and getting tested or screened for diseases. Follow your health care provider's instructions on monitoring your cholesterol and blood pressure. This information is not intended to replace advice given to you by your health care provider. Make sure you discuss any questions you have with your health care provider. Document Revised: 02/06/2021 Document Reviewed: 02/06/2021 Elsevier Patient Education  2023 [employer]  
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
## 2022-08-28 NOTE — Progress Notes (Signed)
I connected with  Buren Kos on 08/28/22 by a audio enabled telemedicine application and verified that I am speaking with the correct person using two identifiers.  Patient Location: Home  Provider Location: Office/Clinic  I discussed the limitations of evaluation and management by telemedicine. The patient expressed understanding and agreed to proceed.   Subjective:   John Garrett is a 61 y.o. male who presents for Medicare Annual/Subsequent preventive examination.  Review of Systems    Per HPI unless specifically indicated below.     Objective:       07/31/2022    9:43 AM 06/29/2022   10:12 AM 12/26/2021    1:21 PM  Vitals with BMI  Height '5\' 7"'$  '5\' 8"'$  '5\' 8"'$   Weight 246 lbs 242 lbs 11 oz 242 lbs  BMI 38.52 17.00 17.4  Systolic [phone]  Diastolic 83 84 66  Pulse 75 80 80    Today's Vitals   08/28/22 1303  PainSc: 6    There is no height or weight on file to calculate BMI.     08/28/2022    1:13 PM 08/22/2021    1:39 PM 02/08/2020   10:21 AM 04/09/2019   11:38 AM 04/22/2018   10:26 AM 04/01/2018   10:13 AM 01/15/2018   10:24 AM  Advanced Directives  Does Patient Have a Medical Advance Directive? No No No No No No No  Would patient like information on creating a medical advance directive? No - Patient declined Yes (MAU/Ambulatory/Procedural Areas - Information given) No - Patient declined No - Patient declined No - Patient declined Yes (MAU/Ambulatory/Procedural Areas - Information given) No - Patient declined    Current Medications (verified) Outpatient Encounter Medications as of 08/28/2022  Medication Sig   buPROPion (WELLBUTRIN XL) 300 MG 24 hr tablet TAKE 1 TABLET EVERY DAY   busPIRone (BUSPAR) 30 MG tablet TAKE 1 TABLET EVERY DAY   celecoxib (CELEBREX) 200 MG capsule TAKE 1 CAPSULE EVERY DAY   citalopram (CELEXA) 40 MG tablet TAKE 1 TABLET EVERY DAY   ezetimibe (ZETIA) 10 MG tablet TAKE 1 TABLET EVERY DAY   hydrOXYzine (ATARAX) 25 MG tablet TAKE 1 TABLET  EVERY DAY   pregabalin (LYRICA) 50 MG capsule Take 1 capsule (50 mg total) by mouth 3 (three) times daily.   rosuvastatin (CRESTOR) 40 MG tablet TAKE 1 TABLET EVERY DAY   sildenafil (VIAGRA) 100 MG tablet Take 0.5-1 tablets (50-100 mg total) by mouth daily as needed for erectile dysfunction.   tiZANidine (ZANAFLEX) 4 MG tablet TAKE 1 TABLET TWICE DAILY AS NEEDED FOR MUSCLE SPASM(S)   No facility-administered encounter medications on file as of 08/28/2022.    Allergies (verified) Patient has no known allergies.   History: Past Medical History:  Diagnosis Date   Anxiety    Chronic back pain    Chronic pain    Depression    Hyperlipidemia    Prediabetes 01/08/2018   Past Surgical History:  Procedure Laterality Date   CATARACT EXTRACTION Right 2014   NASAL SINUS SURGERY  2004   deviated septum   Family History  Adopted: Yes  Family history unknown: Yes   Social History   Socioeconomic History   Marital status: Divorced    Spouse name: Not on file   Number of children: 1   Years of education: Not on file   Highest education level: 10th grade  Occupational History   Occupation: disabled     Comment: since 2004, accident at work 2001, used  to do construction and also kick boxing classes   Tobacco Use   Smoking status: Every Day    Packs/day: 1.00    Years: 25.00    Total pack years: 25.00    Types: Cigarettes    Start date: 1978   Smokeless tobacco: Former    Types: Chew, Snuff    Quit date: 2001   Tobacco comments:    He started at 7, stopped for over 10 years but is smoking again and is trying to cut down on amount    Vaping Use   Vaping Use: Former  Substance and Sexual Activity   Alcohol use: Yes    Alcohol/week: 16.0 standard drinks of alcohol    Types: 4 Glasses of wine, 12 Cans of beer per week   Drug use: No   Sexual activity: Not Currently    Partners: Female    Birth control/protection: Condom  Other Topics Concern   Not on file  Social History  Narrative   Pt lives alone   Social Determinants of Health   Financial Resource Strain: Low Risk  (08/28/2022)   Overall Financial Resource Strain (CARDIA)    Difficulty of Paying Living Expenses: Not hard at all  Food Insecurity: No Food Insecurity (08/28/2022)   Hunger Vital Sign    Worried About Running Out of Food in the Last Year: Never true    [place] in the Last Year: Never true  Transportation Needs: No Transportation Needs (08/28/2022)   PRAPARE - Hydrologist (Medical): No    Lack of Transportation (Non-Medical): No  Physical Activity: Insufficiently Active (08/28/2022)   Exercise Vital Sign    Days of Exercise per Week: 1 day    Minutes of Exercise per Session: 10 min  Stress: No Stress Concern Present (08/28/2022)   [place]    Feeling of Stress : Not at all  Social Connections: Socially Isolated (08/28/2022)   Social Connection and Isolation Panel [NHANES]    Frequency of Communication with Friends and Family: Once a week    Frequency of Social Gatherings with Friends and Family: More than three times a week    Attends Religious Services: Never    Marine scientist or Organizations: No    Attends Music therapist: Never    Marital Status: Divorced    Tobacco Counseling Ready to quit: No Counseling given: No Tobacco comments: He started at 16, stopped for over 10 years but is smoking again and is trying to cut down on amount     Clinical Intake:  Pre-visit preparation completed: No  Pain : 0-10 Pain Score: 6  Pain Type: Chronic pain Pain Location: Back Pain Orientation: Lower Pain Descriptors / Indicators: Aching, Shooting, Sharp Pain Frequency: Constant     Nutritional Risks: None  How often do you need to have someone help you when you read instructions, pamphlets, or other written materials from your doctor or pharmacy?: 1 -  Never  Diabetic?no  Interpreter Needed?: No  Information entered by :: Donnie Mesa, CMA   Activities of Daily Living    08/28/2022    1:00 PM 06/29/2022   10:14 AM  In your present state of health, do you have any difficulty performing the following activities:  Hearing? 0 0  Vision? 1 0  Difficulty concentrating or making decisions? 0 0  Walking or climbing stairs? 0 1  Dressing or bathing? 0  0  Doing errands, shopping? 0 0    Patient Care Team: Steele Sizer, MD as PCP - General (Family Medicine) [name], MD as Consulting Physician (Pain Medicine)  Indicate any recent Medical Services you may have received from other than [facility] providers in the past year (date may be approximate).    No hospitalization in the past 12 months. Assessment:   This is a routine wellness examination for [facility].  Hearing/Vision screen Denies any difficultly with hearing. Overdue for Annual Eye Exam, Admits some difficulty with seeing small print.   Dietary issues and exercise activities discussed: Current Exercise Habits: Home exercise routine, Type of exercise: walking, Time (Minutes): 15, Frequency (Times/Week): 1, Weekly Exercise (Minutes/Week): 15, Intensity: Mild   Goals Addressed             This Visit's Progress    Stay Active and Independent-Low Back Pain       T    Why is this important?   Regular activity or exercise is important to managing back pain.  Activity helps to keep your muscles strong.  You will sleep better and feel more relaxed.  You will have more energy and feel less stressed.  If you are not active now, start slowly. Little changes make a big difference.  Rest, but not too much.  Stay as active as you can and listen to your body's signals.            Depression Screen    06/29/2022   10:14 AM 12/26/2021    1:16 PM 08/22/2021    1:38 PM 06/27/2021    1:05 PM 04/19/2021    9:08 AM 12/01/2020    9:51 AM 06/02/2020    2:17 PM  PHQ 2/9 Scores   PHQ - 2 Score 3 0 0 4 0 1 0  PHQ- 9 Score 5 0 0 4 0 3 0    Fall Risk    08/28/2022    1:00 PM 06/29/2022   10:14 AM 12/26/2021    1:15 PM 08/22/2021    1:40 PM 06/27/2021    1:05 PM  Fall Risk   Falls in the past year? 0 0 0 0 0  Number falls in past yr: 0 0 0 0 0  Injury with Fall? 0 0 0 0 0  Risk for fall due to :   No Fall Risks No Fall Risks No Fall Risks  Follow up   Falls prevention discussed Falls prevention discussed Falls prevention discussed    FALL RISK PREVENTION PERTAINING TO THE HOME:  Any stairs in or around the home? No  If so, are there any without handrails? No  Home free of loose throw rugs in walkways, pet beds, electrical cords, etc? Yes  Adequate lighting in your home to reduce risk of falls? Yes   ASSISTIVE DEVICES UTILIZED TO PREVENT FALLS:  Life alert? No  Use of a cane, walker or w/c? Yes  Grab bars in the bathroom? No  Shower chair or bench in shower? No  Elevated toilet seat or a handicapped toilet? No   TIMED UP AND GO:  Was the test performed?  virtual appt .    Cognitive Function:        08/28/2022    1:01 PM 04/09/2019   11:17 AM 04/01/2018   10:16 AM 01/21/2017   10:53 AM  6CIT Screen  What Year? 0 points 0 points 0 points 0 points  What month? 0 points 0 points 0 points 0  points  What time? 0 points 0 points 0 points 0 points  Count back from 20 0 points 0 points 0 points 0 points  Months in reverse 0 points 0 points 0 points 4 points  Repeat phrase 0 points 2 points 6 points 2 points  Total Score 0 points 2 points 6 points 6 points    Immunizations Immunization History  Administered Date(s) Administered   Influenza Inj Mdck Quad Pf 07/28/2019   Influenza,inj,Quad PF,6+ Mos 07/01/2014, 09/02/2015, 06/19/2017, 07/14/2018, 06/02/2020, 06/27/2021, 06/29/2022   Influenza-Unspecified 07/01/2014   Pneumococcal Polysaccharide-23 07/31/2013   Tdap 08/02/2014, 09/01/2015    TDAP status: Up to date  Flu Vaccine status: Up to  date  Pneumococcal vaccine status: Up to date  Covid-19 vaccine status: Information provided on how to obtain vaccines.   Qualifies for Shingles Vaccine? Yes   Zostavax completed No   Shingrix Completed?: No.    Education has been provided regarding the importance of this vaccine. Patient has been advised to call insurance company to determine out of pocket expense if they have not yet received this vaccine. Advised may also receive vaccine at local pharmacy or Health Dept. Verbalized acceptance and understanding.  Screening Tests Health Maintenance  Topic Date Due   Zoster Vaccines- Shingrix (1 of 2) 11/28/2022 (Originally 09/15/1980)   Lung Cancer Screening  08/29/2023 (Originally 03/09/2015)   Fecal DNA (Cologuard)  06/29/2023   Medicare Annual Wellness (AWV)  08/29/2023   INFLUENZA VACCINE  Completed   Hepatitis C Screening  Completed   HIV Screening  Completed   HPV VACCINES  Aged Out   COVID-19 Vaccine  Discontinued    Health Maintenance  There are no preventive care reminders to display for this patient.   Colorectal cancer screening: Type of screening: Cologuard. Completed 06/28/2020. Repeat every 3 years  Lung Cancer Screening: (Low Dose CT Chest recommended if Age 39-80 years, 30 pack-year currently smoking OR have quit w/in 15years.) does qualify.   Lung Cancer Screening Referral: pt declined referral Additional Screening:  Hepatitis C Screening: does qualify; Completed 02/20/2019  Vision Screening: Recommended annual ophthalmology exams for early detection of glaucoma and other disorders of the eye. Is the patient up to date with their annual eye exam?  No Who is the provider or what is the name of the office in which the patient attends annual eye exams?  If pt is not established with a provider, would they like to be referred to a provider to establish care? No .   Dental Screening: Recommended annual dental exams for proper oral hygiene  Community Resource  Referral / Chronic Care Management: CRR required this visit?  No   CCM required this visit?  No      Plan:     I have personally reviewed and noted the following in the patient's chart:   Medical and social history Use of alcohol, tobacco or illicit drugs  Current medications and supplements including opioid prescriptions. Patient is not currently taking opioid prescriptions. Functional ability and status Nutritional status Physical activity Advanced directives List of other physicians Hospitalizations, surgeries, and ER visits in previous 12 months Vitals Screenings to include cognitive, depression, and falls Referrals and appointments  In addition, I have reviewed and discussed with patient certain preventive protocols, quality metrics, and best practice recommendations. A written personalized care plan for preventive services as well as general preventive health recommendations were provided to patient.    Mr. Brucato , Thank you for taking time to come for  your Medicare Wellness Visit. I appreciate your ongoing commitment to your health goals. Please review the following plan we discussed and let me know if I can assist you in the future.   These are the goals we discussed:  Goals      DIET - INCREASE WATER INTAKE     Recommend to drink at least 6-8 8oz glasses of water per day.     Eat more fruits and vegetables     Recommend eating more vegetables in the daily diet.      Stay Active and Independent-Low Back Pain     T    Why is this important?   Regular activity or exercise is important to managing back pain.  Activity helps to keep your muscles strong.  You will sleep better and feel more relaxed.  You will have more energy and feel less stressed.  If you are not active now, start slowly. Little changes make a big difference.  Rest, but not too much.  Stay as active as you can and listen to your body's signals.             This is a list of the screening  recommended for you and due dates:  Health Maintenance  Topic Date Due   Zoster (Shingles) Vaccine (1 of 2) 11/28/2022*   Screening for Lung Cancer  08/29/2023*   Cologuard (Stool DNA test)  06/29/2023   Medicare Annual Wellness Visit  08/29/2023   Flu Shot  Completed   Hepatitis C Screening: USPSTF Recommendation to screen - Ages 18-79 yo.  Completed   HIV Screening  Completed   HPV Vaccine  Aged Out   COVID-19 Vaccine  Discontinued  *Topic was postponed. The date shown is not the original due date.      Wilson Singer, [place]   08/28/2022   Nurse Notes: Approximately 30 minute Non-Face -To-Face Medicare Wellness Visit

## 2022-08-29 ENCOUNTER — Ambulatory Visit (INDEPENDENT_AMBULATORY_CARE_PROVIDER_SITE_OTHER): Payer: Medicare PPO | Admitting: Family Medicine

## 2022-08-29 ENCOUNTER — Encounter: Payer: Self-pay | Admitting: Family Medicine

## 2022-08-29 VITALS — BP 122/72 | HR 84 | Temp 97.6°F | Resp 18 | Ht 68.0 in | Wt 250.8 lb

## 2022-08-29 DIAGNOSIS — F411 Generalized anxiety disorder: Secondary | ICD-10-CM

## 2022-08-29 DIAGNOSIS — Z720 Tobacco use: Secondary | ICD-10-CM

## 2022-08-29 DIAGNOSIS — F330 Major depressive disorder, recurrent, mild: Secondary | ICD-10-CM

## 2022-08-29 DIAGNOSIS — M5416 Radiculopathy, lumbar region: Secondary | ICD-10-CM

## 2022-08-29 DIAGNOSIS — J410 Simple chronic bronchitis: Secondary | ICD-10-CM | POA: Diagnosis not present

## 2022-08-29 DIAGNOSIS — E782 Mixed hyperlipidemia: Secondary | ICD-10-CM

## 2022-08-29 MED ORDER — CITALOPRAM HYDROBROMIDE 40 MG PO TABS: 40.0000 mg | ORAL_TABLET | Freq: Every day | ORAL | 1 refills | Status: DC

## 2022-08-29 MED ORDER — PREGABALIN 100 MG PO CAPS: 100.0000 mg | ORAL_CAPSULE | Freq: Two times a day (BID) | ORAL | 0 refills | Status: DC

## 2022-08-29 MED ORDER — HYDROXYZINE HCL 25 MG PO TABS: 25.0000 mg | ORAL_TABLET | Freq: Every day | ORAL | 1 refills | Status: DC

## 2022-08-29 MED ORDER — CELECOXIB 200 MG PO CAPS: 200.0000 mg | ORAL_CAPSULE | Freq: Every day | ORAL | 1 refills | Status: DC

## 2022-08-29 MED ORDER — EZETIMIBE 10 MG PO TABS: 10.0000 mg | ORAL_TABLET | Freq: Every day | ORAL | 1 refills | Status: DC

## 2022-08-29 MED ORDER — TIZANIDINE HCL 4 MG PO TABS: 4.0000 mg | ORAL_TABLET | Freq: Two times a day (BID) | ORAL | 1 refills | Status: DC

## 2022-08-29 MED ORDER — BUSPIRONE HCL 30 MG PO TABS: 30.0000 mg | ORAL_TABLET | Freq: Every day | ORAL | 1 refills | Status: DC

## 2022-08-29 MED ORDER — BUPROPION HCL ER (XL) 300 MG PO TB24: 300.0000 mg | ORAL_TABLET | Freq: Every day | ORAL | 1 refills | Status: DC

## 2022-08-29 MED ORDER — ROSUVASTATIN CALCIUM 40 MG PO TABS: 40.0000 mg | ORAL_TABLET | Freq: Every day | ORAL | 1 refills | Status: DC

## 2022-09-04 ENCOUNTER — Other Ambulatory Visit: Payer: Self-pay

## 2022-09-04 ENCOUNTER — Encounter: Payer: Self-pay | Admitting: Surgery

## 2022-09-04 ENCOUNTER — Ambulatory Visit: Payer: Medicare PPO | Admitting: Surgery

## 2022-09-04 VITALS — BP 144/89 | HR 71 | Temp 98.2°F | Ht 68.0 in | Wt 253.0 lb

## 2022-09-04 DIAGNOSIS — D171 Benign lipomatous neoplasm of skin and subcutaneous tissue of trunk: Secondary | ICD-10-CM | POA: Diagnosis not present

## 2022-09-04 DIAGNOSIS — M7989 Other specified soft tissue disorders: Secondary | ICD-10-CM

## 2022-09-04 NOTE — Progress Notes (Signed)
Excision right chest wall lipoma, 12 cm  Pre-operative Diagnosis: Right chest wall lipoma.  Post-operative Diagnosis: same.    Surgeon: Ronny Bacon, M.D., FACS  Anesthesia: Local   Findings: As expected consistent with mature lipoma, discarded.  Estimated Blood Loss: 3 mL         Specimens: See photo, consistent with single lobular mature lipoma of about 12 cm in longest dimension.           Complications: none              Procedure Details  The patient was evaluated, the benefits, complications, treatment options, and expected outcomes were discussed with the patient. The risks of bleeding, infection, recurrence of symptoms, failure to resolve symptoms, unanticipated injury, any of which could require further surgery were reviewed with the patient. The likelihood of improving the patient's symptoms with return to their baseline status is expected.  The patient and/or family concurred with the proposed plan, giving informed consent.  The patient was taken to our procedure room, identified and the procedure verified.    The patient was positioned in the supine position and the right chest wall was prepped with  Chloraprep and draped in the sterile fashion.  A Time Out was held and the above information confirmed.  After local infiltration 1% lidocaine with epi, directly over the mass.  An incision was made and carried through the normal subcutaneous tissues to the lipomatous capsule.  The capsule was then bluntly dissected from the adjacent soft tissues circumferentially until the entire lipoma was mobilized as a whole.  The final pedicle was sharply divided.  Hemostasis obtained with pressure.  Wound was then closed with interrupted dermals of 3-0 Vicryl, followed by a subcuticular 4-0 Monocryl.  The skin was then sealed with Dermabond.  And after the Dermabond was dry we applied a pressure dressing over the area.  He tolerated procedure well.  Will follow-up with [place] as scheduled.   Advised to maintain the dressing for 3 days clean and dry, then may remove it.  Dermabond precautions discussed.  Ronny Bacon, M.D., [facility]  09/04/2022 ; 9:42 AM

## 2022-09-04 NOTE — Patient Instructions (Signed)
Today we have removed a Lipoma in our office. Please see information below regarding this type of tumor.  You are free to shower in 48 hours. This will be on 09/06/2022.  You have glue on your skin and sutures under the skin. The glue will come off on it's own in 10-14 days. You may shower normally until this occurs but do not submerge.  Please use Tylenol or Ibuprofen for pain as needed. You may use ice to the area 3-4 times today and tomorrow for any achiness.   We will see you back in 2 weeks to ensure that this has healed and to review the final pathology. Please see your appointment below. You may continue your regular activities right away but if you are having pain while doing something, stop what you are doing and try this activity once again in 3 days. Please call our office with any questions or concerns prior to your appointment.   Lipoma Removal Lipoma removal is a surgical procedure to remove a noncancerous (benign) tumor that is made up of fat cells (lipoma). Most lipomas are small and painless and do not require treatment. They can form in many areas of the body but are most common under the skin of the back, shoulders, arms, and thighs. You may need lipoma removal if you have a lipoma that is large, growing, or causing discomfort. Lipoma removal may also be done for cosmetic reasons. Tell a health care provider about: Any allergies you have. All medicines you are taking, including vitamins, herbs, eye drops, creams, and over-the-counter medicines. Any problems you or family members have had with anesthetic medicines. Any blood disorders you have. Any surgeries you have had. Any medical conditions you have. Whether you are pregnant or may be pregnant. What are the risks? Generally, this is a safe procedure. However, problems may occur, including: Infection. Bleeding. Allergic reactions to medicines. Damage to nerves or blood vessels near the  lipoma. Scarring.  Medicines Ask your health care provider about: Changing or stopping your regular medicines. This is especially important if you are taking diabetes medicines or blood thinners. Taking medicines such as aspirin and ibuprofen. These medicines can thin your blood. Do not take these medicines before your procedure if your health care provider instructs you not to. You may be given antibiotic medicine to help prevent infection. General instructions Ask your health care provider how your surgical site will be marked or identified. You will have a physical exam. Your health care provider will check the size of the lipoma and whether it can be moved easily.  What happens during the procedure? To reduce your risk of infection: Your health care team will wash or sanitize their hands. Your skin will be washed with soap. You will be given the following: A medicine to numb the area (local anesthetic). An incision will be made over the lipoma or very near the lipoma. The incision may be made in a natural skin line or crease. Tissues, nerves, and blood vessels near the lipoma will be moved out of the way. The lipoma and the capsule that surrounds it will be separated from the surrounding tissues. The lipoma will be removed. The incision may be closed with stitches (sutures). A bandage (dressing) will be placed over the incision.

## 2022-09-13 ENCOUNTER — Other Ambulatory Visit: Payer: Self-pay

## 2022-09-13 ENCOUNTER — Encounter: Payer: Self-pay | Admitting: Surgery

## 2022-09-13 ENCOUNTER — Ambulatory Visit (INDEPENDENT_AMBULATORY_CARE_PROVIDER_SITE_OTHER): Payer: Medicare PPO | Admitting: Surgery

## 2022-09-13 VITALS — BP 135/88 | HR 74 | Temp 98.1°F | Ht 67.0 in | Wt 254.0 lb

## 2022-09-13 DIAGNOSIS — Z09 Encounter for follow-up examination after completed treatment for conditions other than malignant neoplasm: Secondary | ICD-10-CM

## 2022-09-13 DIAGNOSIS — D171 Benign lipomatous neoplasm of skin and subcutaneous tissue of trunk: Secondary | ICD-10-CM

## 2022-09-13 DIAGNOSIS — R222 Localized swelling, mass and lump, trunk: Secondary | ICD-10-CM

## 2022-09-13 NOTE — Progress Notes (Signed)
Status post excision of lipoma from right subcostal/flank area.  No complaints.  No note of drainage.  No wound issues, fevers or chills.  On examination his incision is clean dry and intact.  There is no evidence of hematoma or seroma.  Status post excision of right flank/subcostal lipoma.  Follow-up as needed.

## 2022-09-13 NOTE — Patient Instructions (Signed)
Please call office with any questions or concerns.

## 2022-10-05 ENCOUNTER — Other Ambulatory Visit: Payer: Self-pay | Admitting: Family Medicine
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
## 2023-02-22 ENCOUNTER — Encounter: Payer: Self-pay | Admitting: Family Medicine

## 2023-02-22 ENCOUNTER — Ambulatory Visit (INDEPENDENT_AMBULATORY_CARE_PROVIDER_SITE_OTHER): Payer: Medicare PPO | Admitting: Family Medicine

## 2023-02-22 VITALS — BP 128/86 | HR 86 | Temp 97.9°F | Resp 18 | Ht 68.0 in | Wt 263.2 lb

## 2023-02-22 DIAGNOSIS — M5416 Radiculopathy, lumbar region: Secondary | ICD-10-CM

## 2023-02-22 DIAGNOSIS — E782 Mixed hyperlipidemia: Secondary | ICD-10-CM

## 2023-02-22 DIAGNOSIS — F330 Major depressive disorder, recurrent, mild: Secondary | ICD-10-CM | POA: Diagnosis not present

## 2023-02-22 DIAGNOSIS — F411 Generalized anxiety disorder: Secondary | ICD-10-CM | POA: Diagnosis not present

## 2023-02-22 DIAGNOSIS — J410 Simple chronic bronchitis: Secondary | ICD-10-CM

## 2023-02-22 DIAGNOSIS — I739 Peripheral vascular disease, unspecified: Secondary | ICD-10-CM | POA: Diagnosis not present

## 2023-02-22 MED ORDER — HYDROXYZINE HCL 25 MG PO TABS: 25.0000 mg | ORAL_TABLET | Freq: Every day | ORAL | 1 refills | Status: DC

## 2023-02-22 MED ORDER — EZETIMIBE 10 MG PO TABS: 10.0000 mg | ORAL_TABLET | Freq: Every day | ORAL | 1 refills | Status: DC

## 2023-02-22 MED ORDER — CITALOPRAM HYDROBROMIDE 40 MG PO TABS: 40.0000 mg | ORAL_TABLET | Freq: Every day | ORAL | 1 refills | Status: DC

## 2023-02-22 MED ORDER — BUSPIRONE HCL 30 MG PO TABS: 30.0000 mg | ORAL_TABLET | Freq: Every day | ORAL | 1 refills | Status: DC

## 2023-02-22 MED ORDER — TIZANIDINE HCL 4 MG PO TABS: 4.0000 mg | ORAL_TABLET | Freq: Two times a day (BID) | ORAL | 1 refills | Status: DC

## 2023-02-22 MED ORDER — ROSUVASTATIN CALCIUM 40 MG PO TABS: 40.0000 mg | ORAL_TABLET | Freq: Every day | ORAL | 1 refills | Status: DC

## 2023-02-22 MED ORDER — CELECOXIB 200 MG PO CAPS: 200.0000 mg | ORAL_CAPSULE | Freq: Every day | ORAL | 1 refills | Status: DC

## 2023-02-22 MED ORDER — BUPROPION HCL ER (XL) 300 MG PO TB24: 300.0000 mg | ORAL_TABLET | Freq: Every day | ORAL | 1 refills | Status: DC

## 2023-02-22 MED ORDER — PREGABALIN 100 MG PO CAPS
100.0000 mg | ORAL_CAPSULE | Freq: Two times a day (BID) | ORAL | 0 refills | Status: DC
Start: 2023-02-22 — End: 2023-12-24

## 2023-05-14 ENCOUNTER — Telehealth: Payer: Self-pay | Admitting: Family Medicine

## 2023-05-14 ENCOUNTER — Other Ambulatory Visit: Payer: Self-pay

## 2023-05-14 DIAGNOSIS — M5416 Radiculopathy, lumbar region: Secondary | ICD-10-CM

## 2023-05-14 NOTE — Telephone Encounter (Signed)
Pt called reporting that his Tens machine has stopped working and that he needs a new one. Please advise   Best contact: [phone]

## 2023-05-14 NOTE — Telephone Encounter (Signed)
Ordered

## 2023-05-15 ENCOUNTER — Telehealth: Payer: Self-pay

## 2023-05-15 NOTE — Telephone Encounter (Signed)
Called around to local pharmacy/medical supply and found that [facility] has TENS units. Faxed over order.

## 2023-08-26 ENCOUNTER — Ambulatory Visit: Payer: Medicare PPO | Admitting: Family Medicine

## 2023-10-03 ENCOUNTER — Ambulatory Visit: Payer: Medicare PPO

## 2023-10-03 VITALS — BP 118/64 | Ht 68.0 in | Wt 264.1 lb

## 2023-10-03 DIAGNOSIS — Z122 Encounter for screening for malignant neoplasm of respiratory organs: Secondary | ICD-10-CM

## 2023-10-03 DIAGNOSIS — Z23 Encounter for immunization: Secondary | ICD-10-CM

## 2023-10-03 DIAGNOSIS — Z0000 Encounter for general adult medical examination without abnormal findings: Secondary | ICD-10-CM

## 2023-10-03 DIAGNOSIS — Z1211 Encounter for screening for malignant neoplasm of colon: Secondary | ICD-10-CM

## 2023-10-03 NOTE — Patient Instructions (Addendum)
 John Garrett , Thank you for taking time to come for your Medicare Wellness Visit. I appreciate your ongoing commitment to your health goals. Please review the following plan we discussed and let me know if I can assist you in the future.   Referrals/Orders/Follow-Ups/Clinician Recommendations: PULMONARY REFERRAL SENT  This is a list of the screening recommended for you and due dates:  Health Maintenance  Topic Date Due   Zoster (Shingles) Vaccine (1 of 2) Never done   Pneumococcal Vaccination (2 of 2 - PCV) 07/31/2014   Screening for Lung Cancer  03/09/2015   Flu Shot  05/02/2023   Cologuard (Stool DNA test)  06/29/2023   Medicare Annual Wellness Visit  10/02/2024   DTaP/Tdap/Td vaccine (3 - Td or Tdap) 08/31/2025   Hepatitis C Screening  Completed   HIV Screening  Completed   HPV Vaccine  Aged Out   COVID-19 Vaccine  Discontinued    Advanced directives: (ACP Link)Information on Advanced Care Planning can be found at [place]  Secretary of [facility] Advance Health Care Directives Advance Health Care Directives ([email or web address]   Next Medicare Annual Wellness Visit scheduled for next year: Yes    10/15/24 @ 1:50 PM IN PERSON

## 2023-10-03 NOTE — Addendum Note (Signed)
 Addended by: Hal Hope on: 10/03/2023 02:16 PM   Modules accepted: Orders

## 2023-10-03 NOTE — Progress Notes (Addendum)
 Subjective:   John Garrett is a 63 y.o. male who presents for Medicare Annual/Subsequent preventive examination.  Visit Complete: In person  Cardiac Risk Factors include: advanced age (>52men, >19 women);dyslipidemia;male gender;obesity (BMI >30kg/m2);sedentary lifestyle;smoking/ tobacco exposure     Objective:    Today's Vitals   10/03/23 1345 10/03/23 1348  BP: 118/64   Weight: 264 lb 1.6 oz (119.8 kg)   Height: 5' 8 (1.727 m)   PainSc:  5    Body mass index is 40.16 kg/m.     10/03/2023    1:54 PM 08/28/2022    1:13 PM 08/22/2021    1:39 PM 02/08/2020   10:21 AM 04/09/2019   11:38 AM 04/22/2018   10:26 AM 04/01/2018   10:13 AM  Advanced Directives  Does Patient Have a Medical Advance Directive? No No No No No No No  Would patient like information on creating a medical advance directive? No - Patient declined No - Patient declined Yes (MAU/Ambulatory/Procedural Areas - Information given) No - Patient declined No - Patient declined No - Patient declined Yes (MAU/Ambulatory/Procedural Areas - Information given)    Current Medications (verified) Outpatient Encounter Medications as of 10/03/2023  Medication Sig   buPROPion  (WELLBUTRIN  XL) 300 MG 24 hr tablet Take 1 tablet (300 mg total) by mouth daily.   busPIRone  (BUSPAR ) 30 MG tablet Take 1 tablet (30 mg total) by mouth daily.   celecoxib  (CELEBREX ) 200 MG capsule Take 1 capsule (200 mg total) by mouth daily.   citalopram  (CELEXA ) 40 MG tablet Take 1 tablet (40 mg total) by mouth daily.   ezetimibe  (ZETIA ) 10 MG tablet Take 1 tablet (10 mg total) by mouth daily.   hydrOXYzine  (ATARAX ) 25 MG tablet Take 1 tablet (25 mg total) by mouth daily.   pregabalin  (LYRICA ) 100 MG capsule Take 1 capsule (100 mg total) by mouth 2 (two) times daily.   rosuvastatin  (CRESTOR ) 40 MG tablet Take 1 tablet (40 mg total) by mouth daily.   sildenafil  (VIAGRA ) 100 MG tablet Take 0.5-1 tablets (50-100 mg total) by mouth daily as needed for erectile  dysfunction.   tiZANidine  (ZANAFLEX ) 4 MG tablet Take 1 tablet (4 mg total) by mouth 2 (two) times daily.   No facility-administered encounter medications on file as of 10/03/2023.    Allergies (verified) Patient has no known allergies.   History: Past Medical History:  Diagnosis Date   Anxiety    Chronic back pain    Chronic pain    Depression    Hyperlipidemia    Prediabetes 01/08/2018   Past Surgical History:  Procedure Laterality Date   CATARACT EXTRACTION Right 2014   NASAL SINUS SURGERY  2004   deviated septum   Family History  Adopted: Yes  Family history unknown: Yes   Social History   Socioeconomic History   Marital status: Divorced    Spouse name: Not on file   Number of children: 1   Years of education: Not on file   Highest education level: 10th grade  Occupational History   Occupation: disabled     Comment: since 2004, accident at work 2001, used to engineer, 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   Tobacco Use   Smoking status: Every Day    Current packs/day: 1.00    Average packs/day: 1 pack/day for 47.0 years (47.0 ttl pk-yrs)    Types: Cigarettes    Start date: 1978   Smokeless tobacco: Former    Types: Chew, Snuff    Quit  date: 2001   Tobacco comments:    He started at 35, stopped for over 10 years but is smoking again and is trying to cut down on amount    Vaping Use   Vaping status: Former  Substance and Sexual Activity   Alcohol use: Yes    Alcohol/week: 16.0 standard drinks of alcohol    Types: 4 Glasses of wine, 12 Cans of beer per week   Drug use: No   Sexual activity: Not Currently    Partners: Female    Birth control/protection: Condom  Other Topics Concern   Not on file  Social History Narrative   Pt lives alone   Social Drivers of Health   Financial Resource Strain: Low Risk  (10/03/2023)   Overall Financial Resource Strain (CARDIA)    Difficulty of Paying Living Expenses: Not hard at all  Food Insecurity: No Food Insecurity  (10/03/2023)   Hunger Vital Sign    Worried About Running Out of Food in the Last Year: Never true    Ran Out of Food in the Last Year: Never true  Transportation Needs: No Transportation Needs (10/03/2023)   PRAPARE - Administrator, Civil Service (Medical): No    Lack of Transportation (Non-Medical): No  Physical Activity: Inactive (10/03/2023)   Exercise Vital Sign    Days of Exercise per Week: 0 days    Minutes of Exercise per Session: 0 min  Stress: No Stress Concern Present (10/03/2023)   [employer] of Occupational Health - Occupational Stress Questionnaire    Feeling of Stress : Not at all  Social Connections: Socially Isolated (10/03/2023)   Social Connection and Isolation Panel [NHANES]    Frequency of Communication with Friends and Family: More than three times a week    Frequency of Social Gatherings with Friends and Family: Twice a week    Attends Religious Services: Never    Database Administrator or Organizations: No    Attends Engineer, Structural: Never    Marital Status: Divorced    Tobacco Counseling Ready to quit: Not Answered Counseling given: Not Answered Tobacco comments: He started at 16, stopped for over 10 years but is smoking again and is trying to cut down on amount     Clinical Intake:  Pre-visit preparation completed: Yes  Pain : 0-10 Pain Score: 5  Pain Type: Chronic pain Pain Location: Back Pain Orientation: Lower Pain Descriptors / Indicators: Aching, Discomfort, Constant Pain Onset: More than a month ago Pain Frequency: Constant Pain Relieving Factors: TENS unit  Pain Relieving Factors: TENS unit  BMI - recorded: 40.16 Nutritional Status: BMI > 30  Obese Nutritional Risks: None Diabetes: No  How often do you need to have someone help you when you read instructions, pamphlets, or other written materials from your doctor or pharmacy?: 1 - Never  Interpreter Needed?: No  Information entered by :: JHONNIE DAS,  LPN   Activities of Daily Living    10/03/2023    1:55 PM 09/29/2023    3:12 PM  In your present state of health, do you have any difficulty performing the following activities:  Hearing? 0 0  Vision? 0 0  Difficulty concentrating or making decisions? 0 0  Walking or climbing stairs? 1 1  Dressing or bathing? 0 0  Doing errands, shopping? 0 0  Preparing Food and eating ? N N  Using the Toilet? N N  In the past six months, have you accidently leaked urine? N  Y  Do you have problems with loss of bowel control? N N  Managing your Medications? N N  Managing your Finances? N N  Housekeeping or managing your Housekeeping? N N    Patient Care Team: Sowles, Krichna, MD as PCP - General (Family Medicine) Marcelino Nurse, MD as Consulting Physician (Pain Medicine)  Indicate any recent Medical Services you may have received from other than [facility] providers in the past year (date may be approximate).     Assessment:   This is a routine wellness examination for [facility].  Hearing/Vision screen Hearing Screening - Comments:: NO AIDS Vision Screening - Comments:: READERS-    Goals Addressed             This Visit's Progress    Cut out extra servings         Depression Screen    10/03/2023    1:52 PM 02/22/2023   10:19 AM 08/29/2022   10:19 AM 06/29/2022   10:14 AM 12/26/2021    1:16 PM 08/22/2021    1:38 PM 06/27/2021    1:05 PM  PHQ 2/9 Scores  PHQ - 2 Score 0 4 3 3  0 0 4  PHQ- 9 Score 0 6 4 5  0 0 4    Fall Risk    10/03/2023    1:55 PM 09/29/2023    3:12 PM 02/22/2023   10:11 AM 09/13/2022    8:48 AM 09/04/2022    8:53 AM  Fall Risk   Falls in the past year? 0 0 0 0 0  Number falls in past yr: 0 0     Injury with Fall? 0 0     Risk for fall due to : No Fall Risks  No Fall Risks    Follow up Falls prevention discussed;Falls evaluation completed  Falls prevention discussed;Education provided;Falls evaluation completed      MEDICARE RISK AT HOME: Medicare Risk at  Home Any stairs in or around the home?: Yes If so, are there any without handrails?: No Home free of loose throw rugs in walkways, pet beds, electrical cords, etc?: Yes Adequate lighting in your home to reduce risk of falls?: Yes Life alert?: No Use of a cane, walker or w/c?: Yes (CANE OR WALKING STICK) Grab bars in the bathroom?: No Shower chair or bench in shower?: No Elevated toilet seat or a handicapped toilet?: No  TIMED UP AND GO:  Was the test performed?  Yes  Length of time to ambulate 10 feet: 4 sec Gait steady and fast without use of assistive device    Cognitive Function:        10/03/2023    1:57 PM 08/28/2022    1:01 PM 04/09/2019   11:17 AM 04/01/2018   10:16 AM 01/21/2017   10:53 AM  6CIT Screen  What Year? 0 points 0 points 0 points 0 points 0 points  What month? 0 points 0 points 0 points 0 points 0 points  What time? 0 points 0 points 0 points 0 points 0 points  Count back from 20 0 points 0 points 0 points 0 points 0 points  Months in reverse 2 points 0 points 0 points 0 points 4 points  Repeat phrase 0 points 0 points 2 points 6 points 2 points  Total Score 2 points 0 points 2 points 6 points 6 points    Immunizations Immunization History  Administered Date(s) Administered   Influenza Inj Mdck Quad Pf 07/28/2019   Influenza,inj,Quad PF,6+ Mos 07/01/2014, 09/02/2015,  06/19/2017, 07/14/2018, 06/02/2020, 06/27/2021, 06/29/2022   Influenza-Unspecified 07/01/2014   Pneumococcal Polysaccharide-23 07/31/2013   Tdap 08/02/2014, 09/01/2015    TDAP status: Up to date  Flu Vaccine status: Completed at today's visit  Pneumococcal vaccine status: Declined,  Education has been provided regarding the importance of this vaccine but patient still declined. Advised may receive this vaccine at local pharmacy or Health Dept. Aware to provide a copy of the vaccination record if obtained from local pharmacy or Health Dept. Verbalized acceptance and understanding.    Covid-19 vaccine status: Declined, Education has been provided regarding the importance of this vaccine but patient still declined. Advised may receive this vaccine at local pharmacy or Health Dept.or vaccine clinic. Aware to provide a copy of the vaccination record if obtained from local pharmacy or Health Dept. Verbalized acceptance and understanding.  Qualifies for Shingles Vaccine? Yes   Zostavax completed No   Shingrix  Completed?: No.    Education has been provided regarding the importance of this vaccine. Patient has been advised to call insurance company to determine out of pocket expense if they have not yet received this vaccine. Advised may also receive vaccine at local pharmacy or Health Dept. Verbalized acceptance and understanding.  Screening Tests Health Maintenance  Topic Date Due   Zoster Vaccines- Shingrix  (1 of 2) Never done   Pneumococcal Vaccine 75-71 Years old (2 of 2 - PCV) 07/31/2014   Lung Cancer Screening  03/09/2015   INFLUENZA VACCINE  05/02/2023   Fecal DNA (Cologuard)  06/29/2023   Medicare Annual Wellness (AWV)  10/02/2024   DTaP/Tdap/Td (3 - Td or Tdap) 08/31/2025   Hepatitis C Screening  Completed   HIV Screening  Completed   HPV VACCINES  Aged Out   COVID-19 Vaccine  Discontinued    Health Maintenance  Health Maintenance Due  Topic Date Due   Zoster Vaccines- Shingrix  (1 of 2) Never done   Pneumococcal Vaccine 44-71 Years old (2 of 2 - PCV) 07/31/2014   Lung Cancer Screening  03/09/2015   INFLUENZA VACCINE  05/02/2023   Fecal DNA (Cologuard)  06/29/2023    Colorectal cancer screening: Type of screening: Cologuard. Completed 06/28/20. Repeat every 3 years  Lung Cancer Screening: (Low Dose CT Chest recommended if Age 77-80 years, 20 pack-year currently smoking OR have quit w/in 15years.) does qualify.   Lung Cancer Screening Referral: REFERRAL SENT   Additional Screening:  Hepatitis C Screening: does qualify; Completed 02/20/19  Vision  Screening: Recommended annual ophthalmology exams for early detection of glaucoma and other disorders of the eye. Is the patient up to date with their annual eye exam?  No  Who is the provider or what is the name of the office in which the patient attends annual eye exams? NO ONE If pt is not established with a provider, would they like to be referred to a provider to establish care? No .   Dental Screening: Recommended annual dental exams for proper oral hygiene   Community Resource Referral / Chronic Care Management: CRR required this visit?  No   CCM required this visit?  No     Plan:     I have personally reviewed and noted the following in the patient's chart:   Medical and social history Use of alcohol, tobacco or illicit drugs  Current medications and supplements including opioid prescriptions. Patient is not currently taking opioid prescriptions. Functional ability and status Nutritional status Physical activity Advanced directives List of other physicians Hospitalizations, surgeries, and ER visits in  previous 12 months Vitals Screenings to include cognitive, depression, and falls Referrals and appointments  In addition, I have reviewed and discussed with patient certain preventive protocols, quality metrics, and best practice recommendations. A written personalized care plan for preventive services as well as general preventive health recommendations were provided to patient.     Jhonnie GORMAN Das, LPN   05/07/7973   After Visit Summary: (In Person-Declined) Patient declined AVS at this time.- ON MYCHART  Nurse Notes: PULM REFERRAL SENT; FLU SHOT GIVEN

## 2023-10-16 DIAGNOSIS — Z1211 Encounter for screening for malignant neoplasm of colon: Secondary | ICD-10-CM | POA: Diagnosis not present

## 2023-10-25 ENCOUNTER — Other Ambulatory Visit: Payer: Self-pay

## 2023-10-25 DIAGNOSIS — R195 Other fecal abnormalities: Secondary | ICD-10-CM

## 2023-10-25 LAB — COLOGUARD: COLOGUARD: POSITIVE — AB

## 2023-10-28 ENCOUNTER — Telehealth: Payer: Self-pay

## 2023-10-28 DIAGNOSIS — R195 Other fecal abnormalities: Secondary | ICD-10-CM

## 2023-10-28 DIAGNOSIS — Z1211 Encounter for screening for malignant neoplasm of colon: Secondary | ICD-10-CM

## 2023-10-28 MED ORDER — NA SULFATE-K SULFATE-MG SULF 17.5-3.13-1.6 GM/177ML PO SOLN: 1.0000 | Freq: Once | ORAL | 0 refills | Status: AC

## 2023-10-28 NOTE — Telephone Encounter (Signed)
Gastroenterology Pre-Procedure Review  Request Date: TBD Requesting Physician: Dr. Allegra Lai  PATIENT REVIEW QUESTIONS: The patient responded to the following health history questions as indicated:    1. Are you having any GI issues?  Positive cologuard.  No GI Issues reported 2. Do you have a personal history of Polyps? no 3. Do you have a family history of Colon Cancer or Polyps? no 4. Diabetes Mellitus? no 5. Joint replacements in the past 12 months?no 6. Major health problems in the past 3 months?no 7. Any artificial heart valves, MVP, or defibrillator?no    MEDICATIONS & ALLERGIES:    Patient reports the following regarding taking any anticoagulation/antiplatelet therapy:   Plavix, Coumadin, Eliquis, Xarelto, Lovenox, Pradaxa, Brilinta, or Effient? no Aspirin? no  Patient confirms/reports the following medications:  Current Outpatient Medications  Medication Sig Dispense Refill   buPROPion (WELLBUTRIN XL) 300 MG 24 hr tablet Take 1 tablet (300 mg total) by mouth daily. 90 tablet 1   busPIRone (BUSPAR) 30 MG tablet Take 1 tablet (30 mg total) by mouth daily. 90 tablet 1   celecoxib (CELEBREX) 200 MG capsule Take 1 capsule (200 mg total) by mouth daily. 90 capsule 1   citalopram (CELEXA) 40 MG tablet Take 1 tablet (40 mg total) by mouth daily. 90 tablet 1   ezetimibe (ZETIA) 10 MG tablet Take 1 tablet (10 mg total) by mouth daily. 90 tablet 1   hydrOXYzine (ATARAX) 25 MG tablet Take 1 tablet (25 mg total) by mouth daily. 90 tablet 1   pregabalin (LYRICA) 100 MG capsule Take 1 capsule (100 mg total) by mouth 2 (two) times daily. 180 capsule 0   rosuvastatin (CRESTOR) 40 MG tablet Take 1 tablet (40 mg total) by mouth daily. 90 tablet 1   sildenafil (VIAGRA) 100 MG tablet Take 0.5-1 tablets (50-100 mg total) by mouth daily as needed for erectile dysfunction. 30 tablet 0   tiZANidine (ZANAFLEX) 4 MG tablet Take 1 tablet (4 mg total) by mouth 2 (two) times daily. 180 tablet 1   No current  facility-administered medications for this visit.    Patient confirms/reports the following allergies:  No Known Allergies  No orders of the defined types were placed in this encounter.   AUTHORIZATION INFORMATION Primary Insurance: 1D#: Group #:  Secondary Insurance: 1D#: Group #:  SCHEDULE INFORMATION: Date: TBD Time: Location: [facility]

## 2023-11-11 ENCOUNTER — Encounter: Payer: Self-pay | Admitting: *Deleted

## 2023-11-12 ENCOUNTER — Encounter: Payer: Self-pay | Admitting: Family Medicine

## 2023-11-12 ENCOUNTER — Ambulatory Visit (INDEPENDENT_AMBULATORY_CARE_PROVIDER_SITE_OTHER): Payer: Medicare PPO | Admitting: Family Medicine

## 2023-11-12 VITALS — BP 116/70 | HR 83 | Resp 16 | Ht 68.0 in | Wt 263.2 lb

## 2023-11-12 DIAGNOSIS — E782 Mixed hyperlipidemia: Secondary | ICD-10-CM | POA: Diagnosis not present

## 2023-11-12 DIAGNOSIS — Z0000 Encounter for general adult medical examination without abnormal findings: Secondary | ICD-10-CM

## 2023-11-12 DIAGNOSIS — Z79899 Other long term (current) drug therapy: Secondary | ICD-10-CM

## 2023-11-12 DIAGNOSIS — Z0001 Encounter for general adult medical examination with abnormal findings: Secondary | ICD-10-CM | POA: Diagnosis not present

## 2023-11-12 NOTE — Progress Notes (Signed)
Name: John Garrett   MRN: [id number]    DOB: Sep 13, 1961   Date:11/12/2023       Progress Note  Subjective  Chief Complaint  Chief Complaint  Patient presents with   Annual Exam    HPI  Patient presents for annual CPE .   IPSS     Row Name 11/12/23 1024         International Prostate Symptom Score   How often have you had the sensation of not emptying your bladder? Not at All     How often have you had to urinate less than every two hours? Not at All     How often have you found you stopped and started again several times when you urinated? Not at All     How often have you found it difficult to postpone urination? Not at All     How often have you had a weak urinary stream? Not at All     How often have you had to strain to start urination? Not at All     How many times did you typically get up at night to urinate? 2 Times     Total IPSS Score 2       Quality of Life due to urinary symptoms   If you were to spend the rest of your life with your urinary condition just the way it is now how would you feel about that? Mostly Satisfied              Diet: he cooks at home, eats a lot of chicken, beans Exercise: discussed regular physical activity  Last Dental Exam: he is due for a visit  Last Eye Exam: also due for an exam  Depression: phq 9 is negative    11/12/2023   10:23 AM 10/03/2023    1:52 PM 02/22/2023   10:19 AM 08/29/2022   10:19 AM 06/29/2022   10:14 AM  Depression screen PHQ 2/9  Decreased Interest 0 0 2 2 1   Down, Depressed, Hopeless 0 0 2 1 2   PHQ - 2 Score 0 0 4 3 3   Altered sleeping 0 0 0 0 0  Tired, decreased energy 0 0 2 1 1   Change in appetite 0 0 0 0 0  Feeling bad or failure about yourself  0 0 0 0 1  Trouble concentrating 0 0 0 0 0  Moving slowly or fidgety/restless 0 0 0 0 0  Suicidal thoughts 0 0 0 0 0  PHQ-9 Score 0 0 6 4 5   Difficult doing work/chores Not difficult at all Not difficult at all Not difficult at all Not difficult at all  Somewhat difficult    Hypertension:  BP Readings from Last 3 Encounters:  11/12/23 116/70  10/03/23 118/64  02/22/23 128/86    Obesity: Wt Readings from Last 3 Encounters:  11/12/23 263 lb 3.2 oz (119.4 kg)  10/03/23 264 lb 1.6 oz (119.8 kg)  02/22/23 263 lb 3.2 oz (119.4 kg)   BMI Readings from Last 3 Encounters:  11/12/23 40.02 kg/m  10/03/23 40.16 kg/m  02/22/23 40.02 kg/m     [employer] Visit from 11/12/2023 in [facility]  AUDIT-C Score 4         Divorced STD testing and prevention (HIV/chl/gon/syphilis):  no he states not sexually active  Sexual history: not currently  Hep C Screening: completed Skin cancer: Discussed monitoring for atypical lesions Colorectal cancer: positive cologuard, going to have  colonoscopy  Prostate cancer:  not applicable  Lung cancer:  Low Dose CT Chest recommended if Age 63-80 years, 30 pack-year currently smoking OR have quit w/in 15years. Patient  is a candidate for screening  Patient is not interested  AAA: The USPSTF recommends one-time screening with ultrasonography in men ages 65 to 75 years who have ever smoked. Patient   is a candidate for screening Patient refuses it due to cost ECG:  2018 due for recheck   Vaccines: reviewed with the patient. He wants to hold off on PCV 20 until next month   Advanced Care Planning: A voluntary discussion about advance care planning including the explanation and discussion of advance directives.  Discussed health care proxy and Living will, and the patient was able to identify a health care proxy as daughter .  Patient does not have a living will and power of attorney of health care   Patient Active Problem List   Diagnosis Date Noted   Mild recurrent major depression (HCC) 08/29/2022   Tobacco use 08/29/2022   Coronary artery disease involving native coronary artery of native heart without angina pectoris 04/19/2021   Smokers' cough (HCC) 04/19/2021    Chronic low back pain (Primary Area of Pain) (Bilateral) (R>L) w/ sciatica (Right) 03/26/2018   Lumbar facet joint syndrome (Bilateral) (R>L) 03/26/2018   Spondylosis without myelopathy or radiculopathy, lumbosacral region 03/26/2018   Chronic lower extremity pain (Secondary Area of Pain) (Bilateral) (R>L) 03/26/2018   Chronic radicular pain of lower extremity (S1 dermatome) (Right) 03/26/2018   Chronic neck pain (Tertiary Area of Pain) (Bilateral) (R>L) 03/26/2018   Cervicogenic headache (Right) 03/26/2018   Unilateral occipital headache (Right) 03/26/2018   Cervico-occipital neuralgia (Fourth Area of Pain) (Right) 03/26/2018   Chronic shoulder pain (Fifth Area of Pain) (Left) 03/26/2018   Numbness and tingling of arm (Right) 03/26/2018   Cervical radiculitis (C7/C8) (Right) 03/26/2018   Osteoarthritis of spine with radiculopathy, lumbar region (S1) (Right) 03/26/2018   DDD (degenerative disc disease), lumbar 03/26/2018   Lumbar facet arthropathy (Bilateral) 03/26/2018   DDD (degenerative disc disease), thoracic 03/26/2018   Thoracic Levoscoliosis 03/26/2018   Cervical foraminal stenosis (C3-4, C5-6) (Left) 03/26/2018   DDD (degenerative disc disease), cervical 03/26/2018   Cervicalgia 03/26/2018   Chronic pain syndrome 01/15/2018   Lumbar spondylosis 01/15/2018   Prediabetes 01/08/2018   Nocturnal leg cramps 02/19/2017   Polypharmacy 09/02/2015   Dyslipidemia 08/04/2015   Morbid obesity (HCC) 07/04/2015   Mixed hyperlipidemia 05/03/2015   Chronic lumbar radiculopathy (S1) (Right) 03/08/2015   GAD (generalized anxiety disorder) 03/08/2015   Major depression, chronic 03/08/2015    Past Surgical History:  Procedure Laterality Date   CATARACT EXTRACTION Right 2014   NASAL SINUS SURGERY  2004   deviated septum    Family History  Adopted: Yes  Family history unknown: Yes    Social History   Socioeconomic History   Marital status: Divorced    Spouse name: Not on file    Number of children: 1   Years of education: Not on file   Highest education level: 10th grade  Occupational History   Occupation: disabled     Comment: since 2004, accident at work 2001, used to Engineer, 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   Tobacco Use   Smoking status: Every Day    Current packs/day: 1.00    Average packs/day: 1 pack/day for 47.1 years (47.1 ttl pk-yrs)    Types: Cigarettes    Start date: 24  Smokeless tobacco: Former    Types: Chew, Snuff    Quit date: 2001   Tobacco comments:    He started at 55, stopped for over 10 years but is smoking again and is trying to cut down on amount    Vaping Use   Vaping status: Former  Substance and Sexual Activity   Alcohol use: Yes    Alcohol/week: 16.0 standard drinks of alcohol    Types: 4 Glasses of wine, 12 Cans of beer per week   Drug use: No   Sexual activity: Not Currently    Partners: Female    Birth control/protection: Condom  Other Topics Concern   Not on file  Social History Narrative   Pt lives alone   Social Drivers of Health   Financial Resource Strain: Medium Risk (11/10/2023)   Overall Financial Resource Strain (CARDIA)    Difficulty of Paying Living Expenses: Somewhat hard  Food Insecurity: No Food Insecurity (11/10/2023)   Hunger Vital Sign    Worried About Running Out of Food in the Last Year: Never true    Ran Out of Food in the Last Year: Never true  Transportation Needs: No Transportation Needs (11/10/2023)   PRAPARE - Administrator, Civil Service (Medical): No    Lack of Transportation (Non-Medical): No  Physical Activity: Inactive (11/10/2023)   Exercise Vital Sign    Days of Exercise per Week: 2 days    Minutes of Exercise per Session: 0 min  Stress: No Stress Concern Present (11/10/2023)   [employer] of Occupational Health - Occupational Stress Questionnaire    Feeling of Stress : Only a little  Social Connections: Socially Isolated (11/10/2023)   Social Connection and  Isolation Panel [NHANES]    Frequency of Communication with Friends and Family: Twice a week    Frequency of Social Gatherings with Friends and Family: Once a week    Attends Religious Services: Never    Database administrator or Organizations: No    Attends Banker Meetings: Never    Marital Status: Divorced  Catering manager Violence: Not At Risk (10/03/2023)   Humiliation, Afraid, Rape, and Kick questionnaire    Fear of Current or Ex-Partner: No    Emotionally Abused: No    Physically Abused: No    Sexually Abused: No     Current Outpatient Medications:    buPROPion (WELLBUTRIN XL) 300 MG 24 hr tablet, Take 1 tablet (300 mg total) by mouth daily., Disp: 90 tablet, Rfl: 1   busPIRone (BUSPAR) 30 MG tablet, Take 1 tablet (30 mg total) by mouth daily., Disp: 90 tablet, Rfl: 1   celecoxib (CELEBREX) 200 MG capsule, Take 1 capsule (200 mg total) by mouth daily., Disp: 90 capsule, Rfl: 1   citalopram (CELEXA) 40 MG tablet, Take 1 tablet (40 mg total) by mouth daily., Disp: 90 tablet, Rfl: 1   ezetimibe (ZETIA) 10 MG tablet, Take 1 tablet (10 mg total) by mouth daily., Disp: 90 tablet, Rfl: 1   hydrOXYzine (ATARAX) 25 MG tablet, Take 1 tablet (25 mg total) by mouth daily., Disp: 90 tablet, Rfl: 1   pregabalin (LYRICA) 100 MG capsule, Take 1 capsule (100 mg total) by mouth 2 (two) times daily., Disp: 180 capsule, Rfl: 0   rosuvastatin (CRESTOR) 40 MG tablet, Take 1 tablet (40 mg total) by mouth daily., Disp: 90 tablet, Rfl: 1   sildenafil (VIAGRA) 100 MG tablet, Take 0.5-1 tablets (50-100 mg total) by mouth daily as needed  for erectile dysfunction., Disp: 30 tablet, Rfl: 0   tiZANidine (ZANAFLEX) 4 MG tablet, Take 1 tablet (4 mg total) by mouth 2 (two) times daily., Disp: 180 tablet, Rfl: 1  No Known Allergies   ROS  Ten systems reviewed and is negative except as mentioned in HPI     Objective  Vitals:   11/12/23 1024  BP: 116/70  Pulse: 83  Resp: 16  SpO2: 99%   Weight: 263 lb 3.2 oz (119.4 kg)  Height: 5\' 8"  (1.727 m)    Body mass index is 40.02 kg/m.  Physical Exam  Constitutional: Patient appears well-developed and well-nourished. No distress.  HENT: Head: Normocephalic and atraumatic. Ears: B TMs ok, no erythema or effusion; Nose: Nose normal. Mouth/Throat: Oropharynx is clear and moist. No oropharyngeal exudate.  Eyes: Conjunctivae and EOM are normal. Pupils are equal, round, and reactive to light. No scleral icterus.  Neck: Normal range of motion. Neck supple. No JVD present. No thyromegaly present.  Cardiovascular: Normal rate, regular rhythm and normal heart sounds.  No murmur heard. No BLE edema. Pulmonary/Chest: Effort normal and breath sounds normal. No respiratory distress. Abdominal: Soft. Bowel sounds are normal, no distension. There is no tenderness. no masses MALE GENITALIA: Normal descended testes bilaterally, no masses palpated, no hernias, no lesions, no discharge RECTAL:not done  Musculoskeletal: Normal range of motion, no joint effusions. No gross deformities Neurological: he is alert and oriented to person, place, and time. No cranial nerve deficit. Coordination, balance, strength, speech and gait are normal.  Skin: Skin is warm and dry. No rash noted. No erythema.  Psychiatric: Patient has a normal mood and affect. behavior is normal. Judgment and thought content normal.     Assessment & Plan  1. Well adult exam (Primary)   2. Mixed hyperlipidemia  - Lipid panel  3. Long-term use of high-risk medication  - COMPLETE METABOLIC PANEL WITH GFR - CBC with Differential/Platelet   -Prostate cancer screening and PSA options (with potential risks and benefits of testing vs not testing) were discussed along with recent recs/guidelines. -USPSTF grade A and B recommendations reviewed with patient; age-appropriate recommendations, preventive care, screening tests, etc discussed and encouraged; healthy living encouraged;  see AVS for patient education given to patient -Discussed importance of 150 minutes of physical activity weekly, eat two servings of fish weekly, eat one serving of tree nuts ( cashews, pistachios, pecans, almonds.Marland Kitchen) every other day, eat 6 servings of fruit/vegetables daily and drink plenty of water and avoid sweet beverages.  -Reviewed Health Maintenance: yes

## 2023-11-13 ENCOUNTER — Encounter: Payer: Self-pay | Admitting: Family Medicine

## 2023-11-13 LAB — COMPLETE METABOLIC PANEL WITH GFR
AG Ratio: 2.1 (calc) (ref 1.0–2.5)
ALT: 38 U/L (ref 9–46)
AST: 22 U/L (ref 10–35)
Albumin: 4.1 g/dL (ref 3.6–5.1)
Alkaline phosphatase (APISO): 41 U/L (ref 35–144)
BUN: 9 mg/dL (ref 7–25)
CO2: 26 mmol/L (ref 20–32)
Calcium: 9.1 mg/dL (ref 8.6–10.3)
Chloride: 105 mmol/L (ref 98–110)
Creat: 0.97 mg/dL (ref 0.70–1.35)
Globulin: 2 g/dL (ref 1.9–3.7)
Glucose, Bld: 118 mg/dL — ABNORMAL HIGH (ref 65–99)
Potassium: 4.9 mmol/L (ref 3.5–5.3)
Sodium: 137 mmol/L (ref 135–146)
Total Bilirubin: 0.5 mg/dL (ref 0.2–1.2)
Total Protein: 6.1 g/dL (ref 6.1–8.1)
eGFR: 88 mL/min/{1.73_m2} (ref >=60)

## 2023-11-13 LAB — LIPID PANEL
Cholesterol: 130 mg/dL (ref <200)
HDL: 41 mg/dL (ref >=40)
LDL Cholesterol (Calc): 59 mg/dL
Non-HDL Cholesterol (Calc): 89 mg/dL (ref <130)
Total CHOL/HDL Ratio: 3.2 (calc) (ref <5.0)
Triglycerides: 241 mg/dL — ABNORMAL HIGH (ref <150)

## 2023-11-13 LAB — CBC WITH DIFFERENTIAL/PLATELET
Absolute Lymphocytes: 2301 {cells}/uL (ref 850–3900)
Absolute Monocytes: 488 {cells}/uL (ref 200–950)
Basophils Absolute: 67 {cells}/uL (ref 0–200)
Basophils Relative: 0.9 %
Eosinophils Absolute: 163 {cells}/uL (ref 15–500)
Eosinophils Relative: 2.2 %
HCT: 49.1 % (ref 38.5–50.0)
Hemoglobin: 16.1 g/dL (ref 13.2–17.1)
MCH: 30 pg (ref 27.0–33.0)
MCHC: 32.8 g/dL (ref 32.0–36.0)
MCV: 91.6 fL (ref 80.0–100.0)
MPV: 10.6 fL (ref 7.5–12.5)
Monocytes Relative: 6.6 %
Neutro Abs: 4381 {cells}/uL (ref 1500–7800)
Neutrophils Relative %: 59.2 %
Platelets: 243 10*3/uL (ref 140–400)
RBC: 5.36 10*6/uL (ref 4.20–5.80)
RDW: 12.8 % (ref 11.0–15.0)
Total Lymphocyte: 31.1 %
WBC: 7.4 10*3/uL (ref 3.8–10.8)

## 2023-12-24 ENCOUNTER — Ambulatory Visit (INDEPENDENT_AMBULATORY_CARE_PROVIDER_SITE_OTHER): Payer: Medicare PPO | Admitting: Family Medicine

## 2023-12-24 ENCOUNTER — Encounter: Payer: Self-pay | Admitting: Family Medicine

## 2023-12-24 VITALS — BP 118/72 | HR 80 | Resp 16 | Ht 68.0 in | Wt 257.5 lb

## 2023-12-24 DIAGNOSIS — Z23 Encounter for immunization: Secondary | ICD-10-CM | POA: Diagnosis not present

## 2023-12-24 DIAGNOSIS — F411 Generalized anxiety disorder: Secondary | ICD-10-CM

## 2023-12-24 DIAGNOSIS — M5416 Radiculopathy, lumbar region: Secondary | ICD-10-CM

## 2023-12-24 DIAGNOSIS — J410 Simple chronic bronchitis: Secondary | ICD-10-CM | POA: Diagnosis not present

## 2023-12-24 DIAGNOSIS — I739 Peripheral vascular disease, unspecified: Secondary | ICD-10-CM

## 2023-12-24 DIAGNOSIS — F330 Major depressive disorder, recurrent, mild: Secondary | ICD-10-CM

## 2023-12-24 DIAGNOSIS — E782 Mixed hyperlipidemia: Secondary | ICD-10-CM

## 2023-12-24 DIAGNOSIS — R195 Other fecal abnormalities: Secondary | ICD-10-CM

## 2023-12-24 DIAGNOSIS — E8881 Metabolic syndrome: Secondary | ICD-10-CM

## 2023-12-24 DIAGNOSIS — Z720 Tobacco use: Secondary | ICD-10-CM

## 2023-12-24 MED ORDER — BUPROPION HCL ER (XL) 300 MG PO TB24: 300.0000 mg | ORAL_TABLET | Freq: Every day | ORAL | 1 refills | Status: DC

## 2023-12-24 MED ORDER — BUSPIRONE HCL 30 MG PO TABS: 30.0000 mg | ORAL_TABLET | Freq: Every day | ORAL | 1 refills | Status: DC

## 2023-12-24 MED ORDER — HYDROXYZINE HCL 25 MG PO TABS
25.0000 mg | ORAL_TABLET | Freq: Every day | ORAL | 1 refills | Status: DC
Start: 2023-12-24 — End: 2024-06-25

## 2023-12-24 MED ORDER — ROSUVASTATIN CALCIUM 40 MG PO TABS: 40.0000 mg | ORAL_TABLET | Freq: Every day | ORAL | 1 refills | Status: DC

## 2023-12-24 MED ORDER — CITALOPRAM HYDROBROMIDE 40 MG PO TABS: 40.0000 mg | ORAL_TABLET | Freq: Every day | ORAL | 1 refills | Status: DC

## 2023-12-24 MED ORDER — PREGABALIN 100 MG PO CAPS: 100.0000 mg | ORAL_CAPSULE | Freq: Two times a day (BID) | ORAL | 1 refills | Status: DC

## 2023-12-24 MED ORDER — EZETIMIBE 10 MG PO TABS: 10.0000 mg | ORAL_TABLET | Freq: Every day | ORAL | 1 refills | Status: DC

## 2023-12-24 MED ORDER — TIZANIDINE HCL 4 MG PO TABS: 4.0000 mg | ORAL_TABLET | Freq: Two times a day (BID) | ORAL | 1 refills | Status: DC

## 2023-12-24 MED ORDER — CELECOXIB 200 MG PO CAPS: 200.0000 mg | ORAL_CAPSULE | Freq: Every day | ORAL | 1 refills | Status: DC

## 2023-12-24 NOTE — Addendum Note (Signed)
 Addended by: Alba Cory F on: 12/24/2023 12:42 PM   Modules accepted: Level of Service
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
## 2024-06-25 ENCOUNTER — Ambulatory Visit (INDEPENDENT_AMBULATORY_CARE_PROVIDER_SITE_OTHER): Admitting: Family Medicine

## 2024-06-25 ENCOUNTER — Encounter: Payer: Self-pay | Admitting: Family Medicine

## 2024-06-25 VITALS — BP 132/80 | HR 76 | Temp 98.0°F | Resp 16 | Ht 68.0 in | Wt 256.6 lb

## 2024-06-25 DIAGNOSIS — R195 Other fecal abnormalities: Secondary | ICD-10-CM | POA: Diagnosis not present

## 2024-06-25 DIAGNOSIS — M5416 Radiculopathy, lumbar region: Secondary | ICD-10-CM | POA: Diagnosis not present

## 2024-06-25 DIAGNOSIS — F330 Major depressive disorder, recurrent, mild: Secondary | ICD-10-CM

## 2024-06-25 DIAGNOSIS — F411 Generalized anxiety disorder: Secondary | ICD-10-CM

## 2024-06-25 DIAGNOSIS — I739 Peripheral vascular disease, unspecified: Secondary | ICD-10-CM

## 2024-06-25 DIAGNOSIS — J410 Simple chronic bronchitis: Secondary | ICD-10-CM | POA: Diagnosis not present

## 2024-06-25 DIAGNOSIS — E8881 Metabolic syndrome: Secondary | ICD-10-CM | POA: Diagnosis not present

## 2024-06-25 DIAGNOSIS — E785 Hyperlipidemia, unspecified: Secondary | ICD-10-CM | POA: Diagnosis not present

## 2024-06-25 MED ORDER — ROSUVASTATIN CALCIUM 40 MG PO TABS: 40.0000 mg | ORAL_TABLET | Freq: Every day | ORAL | 1 refills | Status: AC

## 2024-06-25 MED ORDER — BUSPIRONE HCL 30 MG PO TABS: 30.0000 mg | ORAL_TABLET | Freq: Every day | ORAL | 1 refills | Status: AC

## 2024-06-25 MED ORDER — EZETIMIBE 10 MG PO TABS: 10.0000 mg | ORAL_TABLET | Freq: Every day | ORAL | 1 refills | Status: AC

## 2024-06-25 MED ORDER — BUPROPION HCL ER (XL) 300 MG PO TB24: 300.0000 mg | ORAL_TABLET | Freq: Every day | ORAL | 1 refills | Status: AC

## 2024-06-25 MED ORDER — PREGABALIN 100 MG PO CAPS: 100.0000 mg | ORAL_CAPSULE | Freq: Two times a day (BID) | ORAL | 1 refills | Status: AC

## 2024-06-25 MED ORDER — HYDROXYZINE HCL 25 MG PO TABS
25.0000 mg | ORAL_TABLET | Freq: Every evening | ORAL | 1 refills | Status: AC
Start: 2024-06-25 — End: ?

## 2024-06-25 MED ORDER — TIZANIDINE HCL 4 MG PO TABS: 4.0000 mg | ORAL_TABLET | Freq: Two times a day (BID) | ORAL | 1 refills | Status: AC

## 2024-06-25 MED ORDER — CITALOPRAM HYDROBROMIDE 40 MG PO TABS: 40.0000 mg | ORAL_TABLET | Freq: Every day | ORAL | 1 refills | Status: AC

## 2024-06-25 MED ORDER — METFORMIN HCL ER 500 MG PO TB24: 500.0000 mg | ORAL_TABLET | Freq: Every day | ORAL | 0 refills | Status: AC

## 2024-06-25 MED ORDER — CELECOXIB 200 MG PO CAPS: 200.0000 mg | ORAL_CAPSULE | Freq: Every day | ORAL | 1 refills | Status: AC

## 2024-06-25 NOTE — Progress Notes (Signed)
 Name: John Garrett   MRN: [id number]    DOB: Feb 05, 1961   Date:06/25/2024       Progress Note  Subjective  Chief Complaint  Chief Complaint  Patient presents with   Medical Management of Chronic Issues   Discussed the use of AI scribe software for clinical note transcription with the patient, who gave verbal consent to proceed.  History of Present Illness John Garrett is a 63 year old male who presents for a six-month follow-up visit.  His blood pressure at home has been consistently within range, as checked during plasma donations, and he has not been informed that it is too high to donate. He has hypertension and today his blood pressure is 132/80 mmHg, slightly higher than the previous reading of 118/72 mmHg six months ago.  He has a history of a positive Cologuard test but has not yet undergone a colonoscopy due to logistical issues. He is now willing to proceed with the procedure.  He experiences chronic pain, particularly in his lower back. Pain level is 7 out of 10, with an average of 5. Walking exacerbates the pain, causing foot cramps. He takes pregabalin , tizanidine , and Celebrex  for pain management.  Previous labs showed HDL cholesterol improved from 29 to 41 mg/dL, triglycerides at 758 mg/dL, and LDL cholesterol at 59 mg/dL. He takes rosuvastatin  and ezetimibe  for cholesterol management.  His BMI is 39.02, and his weight is stable at 256 lbs. He is interested in weight loss and considering gym attendance.  He smokes a pack of cigarettes a day and has been smoking for the past two years, he has a daily cough.  He is not currently interested in quitting smoking or undergoing lung cancer screening.  He has generalized anxiety disorder. His mood has improved due to community engagement, particularly through [place]. He takes citalopram , Wellbutrin , and Buspar  for mood management, and hydroxyzine  as needed for anxiety.  No shortness of breath and denies pica. Hemoglobin levels are  checked regularly during plasma donation and are sometimes high.    Patient Active Problem List   Diagnosis Date Noted   Mild recurrent major depression 08/29/2022   Tobacco use 08/29/2022   Smokers' cough (HCC) 04/19/2021   Chronic low back pain (Primary Area of Pain) (Bilateral) (R>L) w/ sciatica (Right) 03/26/2018   Lumbar facet joint syndrome (Bilateral) (R>L) 03/26/2018   Spondylosis without myelopathy or radiculopathy, lumbosacral region 03/26/2018   Chronic lower extremity pain (Secondary Area of Pain) (Bilateral) (R>L) 03/26/2018   Chronic radicular pain of lower extremity (S1 dermatome) (Right) 03/26/2018   Chronic neck pain (Tertiary Area of Pain) (Bilateral) (R>L) 03/26/2018   Cervicogenic headache (Right) 03/26/2018   Unilateral occipital headache (Right) 03/26/2018   Cervico-occipital neuralgia (Fourth Area of Pain) (Right) 03/26/2018   Chronic shoulder pain (Fifth Area of Pain) (Left) 03/26/2018   Numbness and tingling of arm (Right) 03/26/2018   Cervical radiculitis (C7/C8) (Right) 03/26/2018   Osteoarthritis of spine with radiculopathy, lumbar region (S1) (Right) 03/26/2018   DDD (degenerative disc disease), lumbar 03/26/2018   Lumbar facet arthropathy (Bilateral) 03/26/2018   DDD (degenerative disc disease), thoracic 03/26/2018   Thoracic Levoscoliosis 03/26/2018   Cervical foraminal stenosis (C3-4, C5-6) (Left) 03/26/2018   DDD (degenerative disc disease), cervical 03/26/2018   Cervicalgia 03/26/2018   Chronic pain syndrome 01/15/2018   Lumbar spondylosis 01/15/2018   Prediabetes 01/08/2018   Nocturnal leg cramps 02/19/2017   Polypharmacy 09/02/2015   Dyslipidemia 08/04/2015   Morbid obesity (HCC) 07/04/2015   Mixed hyperlipidemia 05/03/2015  Chronic lumbar radiculopathy (S1) (Right) 03/08/2015   GAD (generalized anxiety disorder) 03/08/2015   Major depression, chronic 03/08/2015    Past Surgical History:  Procedure Laterality Date   CATARACT EXTRACTION  Right 2014   NASAL SINUS SURGERY  2004   deviated septum    Family History  Adopted: Yes  Family history unknown: Yes    Social History   Tobacco Use   Smoking status: Every Day    Current packs/day: 1.50    Average packs/day: 1.5 packs/day for 47.7 years (71.6 ttl pk-yrs)    Types: Cigarettes    Start date: 1978   Smokeless tobacco: Former    Types: Chew, Snuff    Quit date: 2001   Tobacco comments:    He started at 61, stopped for over 10 years but is smoking again and is trying to cut down on amount    Substance Use Topics   Alcohol use: Yes    Alcohol/week: 16.0 standard drinks of alcohol    Types: 4 Glasses of wine, 12 Cans of beer per week     Current Outpatient Medications:    buPROPion  (WELLBUTRIN  XL) 300 MG 24 hr tablet, Take 1 tablet (300 mg total) by mouth daily., Disp: 90 tablet, Rfl: 1   busPIRone  (BUSPAR ) 30 MG tablet, Take 1 tablet (30 mg total) by mouth daily., Disp: 90 tablet, Rfl: 1   celecoxib  (CELEBREX ) 200 MG capsule, Take 1 capsule (200 mg total) by mouth daily., Disp: 90 capsule, Rfl: 1   citalopram  (CELEXA ) 40 MG tablet, Take 1 tablet (40 mg total) by mouth daily., Disp: 90 tablet, Rfl: 1   ezetimibe  (ZETIA ) 10 MG tablet, Take 1 tablet (10 mg total) by mouth daily., Disp: 90 tablet, Rfl: 1   hydrOXYzine  (ATARAX ) 25 MG tablet, Take 1 tablet (25 mg total) by mouth daily., Disp: 90 tablet, Rfl: 1   pregabalin  (LYRICA ) 100 MG capsule, Take 1 capsule (100 mg total) by mouth 2 (two) times daily., Disp: 180 capsule, Rfl: 1   rosuvastatin  (CRESTOR ) 40 MG tablet, Take 1 tablet (40 mg total) by mouth daily., Disp: 90 tablet, Rfl: 1   sildenafil  (VIAGRA ) 100 MG tablet, Take 0.5-1 tablets (50-100 mg total) by mouth daily as needed for erectile dysfunction., Disp: 30 tablet, Rfl: 0   tiZANidine  (ZANAFLEX ) 4 MG tablet, Take 1 tablet (4 mg total) by mouth 2 (two) times daily., Disp: 180 tablet, Rfl: 1  No Known Allergies  I personally reviewed active problem list,  medication list, allergies, family history with the patient/caregiver today.   ROS  Ten systems reviewed and is negative except as mentioned in HPI    Objective Physical Exam  CONSTITUTIONAL: Patient appears well-developed and well-nourished. No distress. HEENT: Head atraumatic, normocephalic, neck supple. CARDIOVASCULAR: Normal rate, regular rhythm and normal heart sounds. No murmur heard. No BLE edema. PULMONARY: Effort normal and breath sounds normal. Lungs clear to auscultation bilaterally. No respiratory distress. ABDOMINAL: There is no tenderness or distention. MUSCULOSKELETAL: Normal gait.  PSYCHIATRIC: Patient has a normal mood and affect. Behavior is normal. Judgment and thought content normal.  Vitals:   06/25/24 0957  BP: 132/80  Pulse: 76  Resp: 16  Temp: 98 F (36.7 C)  TempSrc: Oral  SpO2: 99%  Weight: 256 lb 9.6 oz (116.4 kg)  Height: 5' 8 (1.727 m)    Body mass index is 39.02 kg/m.   PHQ2/9:    06/25/2024    9:55 AM 12/24/2023   10:17 AM 11/12/2023  10:23 AM 10/03/2023    1:52 PM 02/22/2023   10:19 AM  Depression screen PHQ 2/9  Decreased Interest 0 0 0 0 2  Down, Depressed, Hopeless 0 0 0 0 2  PHQ - 2 Score 0 0 0 0 4  Altered sleeping 0 0 0 0 0  Tired, decreased energy 0 0 0 0 2  Change in appetite 0 0 0 0 0  Feeling bad or failure about yourself  0 0 0 0 0  Trouble concentrating 0 0 0 0 0  Moving slowly or fidgety/restless 0 0 0 0 0  Suicidal thoughts 0 0 0 0 0  PHQ-9 Score 0 0 0 0 6  Difficult doing work/chores Not difficult at all Not difficult at all Not difficult at all Not difficult at all Not difficult at all    phq 9 is negative  Fall Risk:    06/25/2024    9:54 AM 10/03/2023    1:55 PM 09/29/2023    3:12 PM 02/22/2023   10:11 AM 09/13/2022    8:48 AM  Fall Risk   Falls in the past year? 0 0 0 0 0  Number falls in past yr: 0 0 0    Injury with Fall? 0 0 0    Risk for fall due to : No Fall Risks No Fall Risks  No Fall Risks    Follow up Falls evaluation completed Falls prevention discussed;Falls evaluation completed  Falls prevention discussed;Education provided;Falls evaluation completed       Assessment & Plan Lumbar radiculopathy with neurogenic claudication and chronic pain Chronic lumbar radiculopathy with neurogenic claudication causing persistent pain managed with pregabalin , tizanidine , and Celebrex . Discussed medication-induced sedation and strategies to mitigate it. - Refill pregabalin , tizanidine , and Celebrex . - Advise spreading out sedating medications to reduce daytime drowsiness. - Instruct to take hydroxyzine  at night as needed for sedation.  Morbid obesity BMI 39.02, weight stable at 256 lbs. Discussed potential benefits of weight loss on comorbid conditions. - Prescribe metformin  500 mg XR  to aid in weight loss and control blood sugar. - Encourage continuation of physical activity, such as DJing, for mood and weight management.  Prediabetes Prediabetes with stable blood sugar levels. Discussed potential benefits of metformin  for weight loss and blood sugar control. - Prescribe metformin  to aid in weight loss and control blood sugar.  Mixed hyperlipidemia Mixed hyperlipidemia well-managed with rosuvastatin  and ezetimibe . Recent lipid panel showed improvement in HDL and LDL levels. - Continue rosuvastatin  and ezetimibe .  Generalized anxiety disorder and major depressive disorder Mood improved with current medication regimen. Discussed potential sedative effects of hydroxyzine  and adjusted timing of administration. - Continue citalopram , Wellbutrin , and Buspar  as currently prescribed. - Advise taking hydroxyzine  at night as needed for anxiety and sleep.  Nicotine dependence, cigarettes/smokers cough  Continues to smoke one pack per day for the past two years. Not ready to quit smoking at this time.  Positive Cologuard, colonoscopy not yet completed Positive Cologuard test with no  follow-up colonoscopy completed due to logistical issues. He is now willing to proceed with colonoscopy. - Make referral for colonoscopy.  General Health Maintenance Discussed flu vaccination and lung cancer screening. He declined lung cancer screening but agreed to flu vaccination. - Administer flu shot in October.

## 2024-07-06 ENCOUNTER — Other Ambulatory Visit: Payer: Self-pay

## 2024-07-06 ENCOUNTER — Telehealth: Payer: Self-pay

## 2024-07-06 DIAGNOSIS — R195 Other fecal abnormalities: Secondary | ICD-10-CM

## 2024-07-06 DIAGNOSIS — Z1211 Encounter for screening for malignant neoplasm of colon: Secondary | ICD-10-CM

## 2024-07-06 NOTE — Telephone Encounter (Signed)
 Gastroenterology Pre-Procedure Review  Request Date: 09/14/24 Requesting Physician: Dr. Melany  PATIENT REVIEW QUESTIONS: The patient responded to the following health history questions as indicated:    1. Are you having any GI issues? No GI Issues, however positive cologuard 1st colonoscopy 2. Do you have a personal history of Polyps? no 3. Do you have a family history of Colon Cancer or Polyps? no 4. Diabetes Mellitus? Not a diabetic but takes Metformin  for weigh loss has been advised to stop 2 days prior 5. Joint replacements in the past 12 months?no 6. Major health problems in the past 3 months?no 7. Any artificial heart valves, MVP, or defibrillator?no    MEDICATIONS & ALLERGIES:    Patient reports the following regarding taking any anticoagulation/antiplatelet therapy:   Plavix, Coumadin, Eliquis, Xarelto, Lovenox, Pradaxa, Brilinta, or Effient? no Aspirin? no  Patient confirms/reports the following medications:  Current Outpatient Medications  Medication Sig Dispense Refill   buPROPion  (WELLBUTRIN  XL) 300 MG 24 hr tablet Take 1 tablet (300 mg total) by mouth daily. 90 tablet 1   busPIRone  (BUSPAR ) 30 MG tablet Take 1 tablet (30 mg total) by mouth daily. 90 tablet 1   celecoxib  (CELEBREX ) 200 MG capsule Take 1 capsule (200 mg total) by mouth daily. 90 capsule 1   citalopram  (CELEXA ) 40 MG tablet Take 1 tablet (40 mg total) by mouth daily. 90 tablet 1   ezetimibe  (ZETIA ) 10 MG tablet Take 1 tablet (10 mg total) by mouth daily. 90 tablet 1   hydrOXYzine  (ATARAX ) 25 MG tablet Take 1 tablet (25 mg total) by mouth every evening. 90 tablet 1   metFORMIN  (GLUCOPHAGE -XR) 500 MG 24 hr tablet Take 1 tablet (500 mg total) by mouth daily with breakfast. 90 tablet 0   pregabalin  (LYRICA ) 100 MG capsule Take 1 capsule (100 mg total) by mouth 2 (two) times daily. 180 capsule 1   rosuvastatin  (CRESTOR ) 40 MG tablet Take 1 tablet (40 mg total) by mouth daily. 90 tablet 1   sildenafil  (VIAGRA )  100 MG tablet Take 0.5-1 tablets (50-100 mg total) by mouth daily as needed for erectile dysfunction. 30 tablet 0   tiZANidine  (ZANAFLEX ) 4 MG tablet Take 1 tablet (4 mg total) by mouth 2 (two) times daily. 180 tablet 1   No current facility-administered medications for this visit.    Patient confirms/reports the following allergies:  No Known Allergies  No orders of the defined types were placed in this encounter.   AUTHORIZATION INFORMATION Primary Insurance: 1D#: Group #:  Secondary Insurance: 1D#: Group #:  SCHEDULE INFORMATION: Date: 09/14/24 Time: Location: [facility]

## 2024-07-09 ENCOUNTER — Ambulatory Visit (INDEPENDENT_AMBULATORY_CARE_PROVIDER_SITE_OTHER)

## 2024-07-09 DIAGNOSIS — Z23 Encounter for immunization: Secondary | ICD-10-CM | POA: Diagnosis not present

## 2024-08-26 ENCOUNTER — Telehealth: Payer: Self-pay

## 2024-08-26 NOTE — Telephone Encounter (Signed)
 Copied from CRM #[id number]. Topic: Clinical - Medical Advice >> Aug 26, 2024 10:21 AM Dedra B wrote: Reason for CRM: Pt said he donates plasma, and his iron is always high when they check it. He would like to know what Dr. Glenard suggests.

## 2024-09-09 ENCOUNTER — Encounter: Payer: Self-pay | Admitting: Gastroenterology

## 2024-09-11 NOTE — H&P (Signed)
 John Schaffer, MD  [address] [place], [place] [address] Phone: [phone] Fax : [phone]  Primary Care Physician:  Sowles, Krichna, MD Primary Gastroenterologist:  Dr. Schaffer  Pre-Procedure History & Physical: HPI:  John Garrett is a 63 y.o. male is here for a screening colonoscopy.  Prior colonoscopy? No, reports a positive Cologuard Fhx CRC? None known (foster care history) Blood thinners? No.  Past Medical History:  Diagnosis Date   Anxiety    Arthritis    Chronic back pain    Chronic pain    Depression    Hyperlipidemia    Prediabetes 01/08/2018    Past Surgical History:  Procedure Laterality Date   CATARACT EXTRACTION Right 2014   NASAL SINUS SURGERY  2004   deviated septum    Prior to Admission medications  Medication Sig Start Date End Date Taking? Authorizing Provider  buPROPion  (WELLBUTRIN  XL) 300 MG 24 hr tablet Take 1 tablet (300 mg total) by mouth daily. 06/25/24  Yes Sowles, Krichna, MD  busPIRone  (BUSPAR ) 30 MG tablet Take 1 tablet (30 mg total) by mouth daily. 06/25/24  Yes Sowles, Krichna, MD  celecoxib  (CELEBREX ) 200 MG capsule Take 1 capsule (200 mg total) by mouth daily. 06/25/24  Yes Sowles, Krichna, MD  citalopram  (CELEXA ) 40 MG tablet Take 1 tablet (40 mg total) by mouth daily. 06/25/24  Yes Sowles, Krichna, MD  ezetimibe  (ZETIA ) 10 MG tablet Take 1 tablet (10 mg total) by mouth daily. 06/25/24  Yes Sowles, Krichna, MD  hydrOXYzine  (ATARAX ) 25 MG tablet Take 1 tablet (25 mg total) by mouth every evening. 06/25/24  Yes Sowles, Krichna, MD  metFORMIN  (GLUCOPHAGE -XR) 500 MG 24 hr tablet Take 1 tablet (500 mg total) by mouth daily with breakfast. 06/25/24  Yes Sowles, Krichna, MD  pregabalin  (LYRICA ) 100 MG capsule Take 1 capsule (100 mg total) by mouth 2 (two) times daily. 06/25/24  Yes Sowles, Krichna, MD  rosuvastatin  (CRESTOR ) 40 MG tablet Take 1 tablet (40 mg total) by mouth daily. 06/25/24  Yes Sowles, Krichna, MD  tiZANidine   (ZANAFLEX ) 4 MG tablet Take 1 tablet (4 mg total) by mouth 2 (two) times daily. 06/25/24  Yes Sowles, Krichna, MD  sildenafil  (VIAGRA ) 100 MG tablet Take 0.5-1 tablets (50-100 mg total) by mouth daily as needed for erectile dysfunction. 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   Tobacco Use   Smoking status: Every Day    Current packs/day: 1.50    Average packs/day: 1.5 packs/day for 47.9 years (71.9 ttl pk-yrs)    Types: Cigarettes    Start date: 1978   Smokeless tobacco: Former    Types: Chew, Snuff    Quit date: 2001   Tobacco comments:    He started at 42, stopped for over 10 years but is smoking again and is trying to cut down on amount    Vaping Use   Vaping status: Former  Substance and Sexual Activity  Alcohol use: Yes    Alcohol/week: 16.0 standard drinks of alcohol    Types: 4 Glasses of wine, 12 Cans of beer per week   Drug use: No   Sexual activity: Not Currently    Partners: Female    Birth control/protection: Condom  Other Topics Concern   Not on file  Social History Narrative   Pt lives alone   Social Drivers of Health   Tobacco Use: High Risk (09/09/2024)   Patient History    Smoking Tobacco Use: Every Day    Smokeless Tobacco Use: Former    Passive Exposure: Not on Actuary Strain: Low Risk (06/22/2024)   Overall Financial Resource Strain (CARDIA)    Difficulty of Paying Living Expenses: Not very hard  Food Insecurity: No Food Insecurity (06/22/2024)   Epic    Worried About  Radiation Protection Practitioner of Food in the Last Year: Never true    Ran Out of Food in the Last Year: Never true  Transportation Needs: Unknown (06/22/2024)   Epic    Lack of Transportation (Medical): No    Lack of Transportation (Non-Medical): Not on file  Physical Activity: Inactive (06/22/2024)   Exercise Vital Sign    Days of Exercise per Week: 0 days    Minutes of Exercise per Session: Not on file  Stress: No Stress Concern Present (06/22/2024)   [employer] of Occupational Health - Occupational Stress Questionnaire    Feeling of Stress: Only a little  Social Connections: Socially Isolated (06/22/2024)   Social Connection and Isolation Panel    Frequency of Communication with Friends and Family: Twice a week    Frequency of Social Gatherings with Friends and Family: Once a week    Attends Religious Services: Patient declined    Database Administrator or Organizations: No    Attends Engineer, Structural: Not on file    Marital Status: Divorced  Intimate Partner Violence: Not At Risk (10/03/2023)   Humiliation, Afraid, Rape, and Kick questionnaire    Fear of Current or Ex-Partner: No    Emotionally Abused: No    Physically Abused: No    Sexually Abused: No  Depression (PHQ2-9): Low Risk (06/25/2024)   Depression (PHQ2-9)    PHQ-2 Score: 0  Alcohol Screen: Low Risk (06/22/2024)   Alcohol Screen    Last Alcohol Screening Score (AUDIT): 5  Housing: Unknown (06/22/2024)   Epic    Unable to Pay for Housing in the Last Year: No    Number of Times Moved in the Last Year: Not on file    Homeless in the Last Year: No  Utilities: Not At Risk (10/03/2023)   [facility] Utilities    Threatened with loss of utilities: No  Health Literacy: Adequate Health Literacy (10/03/2023)   B1300 Health Literacy    Frequency of need for help with medical instructions: Never    Review of Systems: See HPI, otherwise negative ROS  Physical Exam: Ht 5' 7 (1.702 m)   Wt 111.1 kg   BMI 38.37 kg/m   CONSTITUTIONAL: Well-appearing in no acute distress.  HEENT: Pupils equal, round, Extraocular movements intact. Conjunctivae clear NECK: Neck supple CARDIOVASCULAR: Regular rate, no LE edema  RESPIRATORY: No labored breathing  ABDOMEN: Abdomen soft, nontender, not distended, no guarding, no rigidity SKIN: No apparent skin rashes or lesions. NEUROLOGIC: Normal speech, no focal findings. Mental status alert and oriented x4. PSYCHIATRIC: Mood and affect normal.   Impression/Plan: John Garrett is now here to undergo a  screening colonoscopy.  Risks, benefits, and alternatives regarding colonoscopy have been reviewed with the patient.  Questions have been answered.  All parties agreeable.

## 2024-09-14 ENCOUNTER — Ambulatory Visit
Admission: RE | Admit: 2024-09-14 | Discharge: 2024-09-14 | Disposition: A | Source: Home / Self Care | Attending: Gastroenterology | Admitting: Gastroenterology

## 2024-09-14 ENCOUNTER — Encounter: Payer: Self-pay | Admitting: Gastroenterology

## 2024-09-14 ENCOUNTER — Other Ambulatory Visit: Payer: Self-pay

## 2024-09-14 ENCOUNTER — Encounter: Admission: RE | Disposition: A | Payer: Self-pay | Source: Home / Self Care | Attending: Gastroenterology

## 2024-09-14 ENCOUNTER — Ambulatory Visit: Payer: Self-pay | Admitting: Anesthesiology

## 2024-09-14 DIAGNOSIS — F32A Depression, unspecified: Secondary | ICD-10-CM | POA: Diagnosis not present

## 2024-09-14 DIAGNOSIS — F419 Anxiety disorder, unspecified: Secondary | ICD-10-CM | POA: Insufficient documentation

## 2024-09-14 DIAGNOSIS — F17210 Nicotine dependence, cigarettes, uncomplicated: Secondary | ICD-10-CM | POA: Insufficient documentation

## 2024-09-14 DIAGNOSIS — D125 Benign neoplasm of sigmoid colon: Secondary | ICD-10-CM | POA: Diagnosis not present

## 2024-09-14 DIAGNOSIS — K635 Polyp of colon: Secondary | ICD-10-CM

## 2024-09-14 DIAGNOSIS — R195 Other fecal abnormalities: Secondary | ICD-10-CM | POA: Diagnosis not present

## 2024-09-14 DIAGNOSIS — D124 Benign neoplasm of descending colon: Secondary | ICD-10-CM | POA: Diagnosis not present

## 2024-09-14 DIAGNOSIS — K640 First degree hemorrhoids: Secondary | ICD-10-CM | POA: Insufficient documentation

## 2024-09-14 DIAGNOSIS — M1990 Unspecified osteoarthritis, unspecified site: Secondary | ICD-10-CM | POA: Diagnosis not present

## 2024-09-14 DIAGNOSIS — Z1211 Encounter for screening for malignant neoplasm of colon: Secondary | ICD-10-CM | POA: Diagnosis present

## 2024-09-14 DIAGNOSIS — K5730 Diverticulosis of large intestine without perforation or abscess without bleeding: Secondary | ICD-10-CM | POA: Insufficient documentation

## 2024-09-14 HISTORY — PX: COLONOSCOPY: SHX5424

## 2024-09-14 SURGERY — COLONOSCOPY
Anesthesia: General

## 2024-09-14 MED ORDER — SODIUM CHLORIDE 0.9 % IV SOLN: INTRAVENOUS | Status: DC

## 2024-09-14 MED ORDER — LACTATED RINGERS IV SOLN: INTRAVENOUS | Status: DC

## 2024-09-14 MED ORDER — PROPOFOL 10 MG/ML IV BOLUS
INTRAVENOUS | Status: DC | PRN
  Administered 2024-09-14 (×2): 80 mg via INTRAVENOUS
  Administered 2024-09-14: 09:00:00 50 mg via INTRAVENOUS
  Administered 2024-09-14: 09:00:00 140 mg via INTRAVENOUS

## 2024-09-14 MED ORDER — PROPOFOL 10 MG/ML IV BOLUS
INTRAVENOUS | Status: AC
  Filled 2024-09-14: qty 40

## 2024-09-14 MED ORDER — LIDOCAINE HCL (CARDIAC) PF 100 MG/5ML IV SOSY
PREFILLED_SYRINGE | INTRAVENOUS | Status: DC | PRN
  Administered 2024-09-14: 09:00:00 50 mg via INTRAVENOUS

## 2024-09-14 MED ORDER — STERILE WATER FOR IRRIGATION IR SOLN: Status: DC | PRN

## 2024-09-14 SURGICAL SUPPLY — 19 items
CLIP HMST 235XBRD CATH ROT (MISCELLANEOUS) IMPLANT
ELECTRODE REM PT RTRN 9FT ADLT (ELECTROSURGICAL) IMPLANT
FORCEPS BIOP RAD 4 LRG CAP 4 (CUTTING FORCEPS) IMPLANT
GOWN CVR UNV OPN BCK APRN NK (MISCELLANEOUS) ×2 IMPLANT
INJECTOR VARIJECT VIN23 (MISCELLANEOUS) IMPLANT
KIT DEFENDO VALVE AND CONN (KITS) IMPLANT
KIT PROCEDURE OLYMPUS (MISCELLANEOUS) ×1 IMPLANT
MANIFOLD NEPTUNE II (INSTRUMENTS) ×1 IMPLANT
MARKER SPOT ENDO TATTOO 5ML (MISCELLANEOUS) IMPLANT
PROBE APC STR FIRE (PROBE) IMPLANT
RETRIEVER NET ROTH 2.5X230 LF (MISCELLANEOUS) IMPLANT
SNARE COLD EXACTO (MISCELLANEOUS) IMPLANT
SNARE SHORT THROW 13M SML OVAL (MISCELLANEOUS) IMPLANT
SNARE SNG USE RND 15MM (INSTRUMENTS) IMPLANT
STRAP BODY AND KNEE 60X3 (MISCELLANEOUS) ×1 IMPLANT
SYR 50ML LL SCALE MARK (SYRINGE) IMPLANT
SYR 50ML SLIP (SYRINGE) IMPLANT
TRAP ETRAP POLY (MISCELLANEOUS) IMPLANT
WATER STERILE IRR 250ML POUR (IV SOLUTION) ×1 IMPLANT

## 2024-09-14 NOTE — Anesthesia Postprocedure Evaluation (Signed)
 Anesthesia Post Note  Patient: John Garrett  Procedure(s) Performed: COLONOSCOPY WITH POLYPECTOMY  Patient location during evaluation: PACU Anesthesia Type: General Level of consciousness: awake and alert Pain management: pain level controlled Vital Signs Assessment: post-procedure vital signs reviewed and stable Respiratory status: spontaneous breathing, nonlabored ventilation, respiratory function stable and patient connected to nasal cannula oxygen Cardiovascular status: blood pressure returned to baseline and stable Postop Assessment: no apparent nausea or vomiting Anesthetic complications: no   No notable events documented.   Last Vitals:  Vitals:   09/14/24 0925 09/14/24 0930  BP:  97/84  Pulse: 76 76  Resp: 14 17  Temp:  36.8 C  SpO2: 94% 94%    Last Pain:  Vitals:   09/14/24 0930  PainSc: 0-No pain                 John Garrett C John Garrett

## 2024-09-14 NOTE — Op Note (Signed)
 [facility] Patient Name: John Garrett Procedure Date: 09/14/2024 8:55 AM MRN: [id number] Account #: [id number] Date of Birth: 11-20-1960 Admit Type: Outpatient Age: 63 Room: [facility] OR ROOM 01 Gender: Male Note Status: Finalized Instrument Name: Arvis [id number] Procedure:             Colonoscopy Indications:           Screening for colorectal malignant neoplasm due to                         positive Cologuard test Providers:             Clotilda Schaffer, MD Referring MD:          Dorette FALCON. Sowles, MD (Referring MD) Medicines:             Propofol  per Anesthesia Complications:         No immediate complications. Procedure:             Pre-Anesthesia Assessment:                        - Prior to the procedure, a History and Physical was                         performed, and patient medications and allergies were                         reviewed. The patient's tolerance of previous                         anesthesia was also reviewed. The risks and benefits                         of the procedure and the sedation options and risks                         were discussed with the patient. All questions were                         answered, and informed consent was obtained. Prior                         Anticoagulants: The patient has taken no anticoagulant                         or antiplatelet agents. ASA Grade Assessment: III - A                         patient with severe systemic disease. After reviewing                         the risks and benefits, the patient was deemed in                         satisfactory condition to undergo the procedure.                        After obtaining informed consent, the colonoscope was  passed under direct vision. Throughout the procedure,                         the patient's blood pressure, pulse, and oxygen                         saturations were monitored continuously. The                          Colonoscope was introduced through the anus and                         advanced to the 10 cm into the ileum. The colonoscopy                         was performed without difficulty. The patient                         tolerated the procedure well. The quality of the bowel                         preparation was good. The terminal ileum, ileocecal                         valve, appendiceal orifice, and rectum were                         photographed. Findings:      Three sessile polyps were found in the sigmoid colon and descending       colon. The polyps were 4 to 8 mm in size. These polyps were removed with       a cold snare. Resection and retrieval were complete. Estimated blood       loss: none.      Multiple medium-mouthed diverticula were found in the sigmoid colon.      Internal hemorrhoids were found. The hemorrhoids were Grade I (internal       hemorrhoids that do not prolapse).      The terminal ileum appeared normal. Impression:            - Three 4 to 8 mm polyps in the sigmoid colon and in                         the descending colon, removed with a cold snare.                         Resected and retrieved.                        - Diverticulosis in the sigmoid colon.                        - Internal hemorrhoids.                        - The examined portion of the ileum was normal. Recommendation:        - Patient has a contact number available for  emergencies. The signs and symptoms of potential                         delayed complications were discussed with the patient.                         Return to normal activities tomorrow. Written                         discharge instructions were provided to the patient.                        - High fiber diet indefinitely.                        - Continue present medications.                        - Await pathology results.                        - Repeat colonoscopy  in 3 - 5 years for surveillance                         based on pathology results.                        - The findings and recommendations were discussed with                         the designated responsible adult. Procedure Code(s):     --- Professional ---                        [phone], Colonoscopy, flexible; with removal of                         tumor(s), polyp(s), or other lesion(s) by snare                         technique Diagnosis Code(s):     --- Professional ---                        Z12.11, Encounter for screening for malignant neoplasm                         of colon                        R19.5, Other fecal abnormalities                        D12.5, Benign neoplasm of sigmoid colon                        D12.4, Benign neoplasm of descending colon                        K64.0, First degree hemorrhoids                        K57.30, Diverticulosis of large  intestine without                         perforation or abscess without bleeding CPT copyright 2022 American Medical Association. All rights reserved. The codes documented in this report are preliminary and upon coder review may  be revised to meet current compliance requirements. Clotilda Schaffer, MD 09/14/2024 9:19:12 AM Number of Addenda: 0 Note Initiated On: 09/14/2024 8:55 AM Scope Withdrawal Time: 0 hours 10 minutes 41 seconds  Total Procedure Duration: 0 hours 13 minutes 21 seconds  Estimated Blood Loss:  Estimated blood loss: none.      [facility]

## 2024-09-14 NOTE — Anesthesia Preprocedure Evaluation (Signed)
 Anesthesia Evaluation    Airway Mallampati: III  TM Distance: >3 FB Neck ROM: Full   Comment: Snores, but denies daytime drowsiness except as related to some meds he takes Full beard, mustache Dental no notable dental hx. (+) Chipped   Pulmonary Current Smoker   Pulmonary exam normal breath sounds clear to auscultation       Cardiovascular Normal cardiovascular exam Rhythm:Regular Rate:Normal     Neuro/Psych  Headaches PSYCHIATRIC DISORDERS Anxiety Depression     Neuromuscular disease    GI/Hepatic   Endo/Other    Renal/GU      Musculoskeletal  (+) Arthritis ,    Abdominal   Peds  Hematology   Anesthesia Other Findings Depression  Chronic back pain Anxiety  Hyperlipidemia Chronic pain  Prediabetes Arthritis  Obesity (BMI 30-39.9) Generalized anxiety disorder  Smokers' cough (HCC)    Reproductive/Obstetrics                              Anesthesia Physical Anesthesia Plan  ASA: 2  Anesthesia Plan: General   Post-op Pain Management:    Induction: Intravenous  PONV Risk Score and Plan:   Airway Management Planned: Simple Face Mask and Natural Airway  Additional Equipment:   Intra-op Plan:   Post-operative Plan: Extubation in OR  Informed Consent: I have reviewed the patients History and Physical, chart, labs and discussed the procedure including the risks, benefits and alternatives for the proposed anesthesia with the patient or authorized representative who has indicated his/her understanding and acceptance.     Dental Advisory Given  Plan Discussed with: Anesthesiologist, CRNA and Surgeon  Anesthesia Plan Comments: (Parent consented for risks of anesthesia including but not limited to:  - adverse reactions to medications - damage to eyes, teeth, lips or other oral mucosa including nose bleeds - nerve damage due to positioning  - sore throat or hoarseness - Damage  to heart, brain, nerves, lungs, other parts of body or loss of life  Parent voiced understanding and assent.  )         Anesthesia Quick Evaluation

## 2024-09-14 NOTE — Transfer of Care (Signed)
 Immediate Anesthesia Transfer of Care Note  Patient: John Garrett  Procedure(s) Performed: COLONOSCOPY WITH POLYPECTOMY  Patient Location: PACU  Anesthesia Type: General  Level of Consciousness: awake, alert  and patient cooperative  Airway and Oxygen Therapy: Patient Spontanous Breathing and Patient connected to supplemental oxygen  Post-op Assessment: Post-op Vital signs reviewed, Patient's Cardiovascular Status Stable, Respiratory Function Stable, Patent Airway and No signs of Nausea or vomiting  Post-op Vital Signs: Reviewed and stable  Complications: No notable events documented.

## 2024-09-15 LAB — SURGICAL PATHOLOGY

## 2024-09-21 ENCOUNTER — Ambulatory Visit: Payer: Self-pay | Admitting: Gastroenterology

## 2024-10-15 ENCOUNTER — Ambulatory Visit (INDEPENDENT_AMBULATORY_CARE_PROVIDER_SITE_OTHER): Payer: Self-pay

## 2024-10-15 VITALS — BP 112/80 | Ht 66.0 in | Wt 256.4 lb

## 2024-10-15 DIAGNOSIS — Z0000 Encounter for general adult medical examination without abnormal findings: Secondary | ICD-10-CM | POA: Diagnosis not present

## 2024-10-15 NOTE — Patient Instructions (Addendum)
 John Garrett,  Thank you for taking the time for your Medicare Wellness Visit. I appreciate your continued commitment to your health goals. Please review the care plan we discussed, and feel free to reach out if I can assist you further.  Please note that Annual Wellness Visits do not include a physical exam. Some assessments may be limited, especially if the visit was conducted virtually. If needed, we may recommend an in-person follow-up with your provider.  Ongoing Care Seeing your primary care provider every 3 to 6 months helps us  monitor your health and provide consistent, personalized care. 11/12/24 @ 10:40 AM APPT FOR PHYSICAL W/ DR.SOWLES  Referrals If a referral was made during today's visit and you haven't received any updates within two weeks, please contact the referred provider directly to check on the status.  Recommended Screenings:  Health Maintenance  Topic Date Due   COVID-19 Vaccine (1) Never done   Zoster (Shingles) Vaccine (1 of 2) Never done   Medicare Annual Wellness Visit  10/02/2024   Screening for Lung Cancer  06/25/2025*   DTaP/Tdap/Td vaccine (3 - Td or Tdap) 08/31/2025   Colon Cancer Screening  09/15/2027   Pneumococcal Vaccine for age over 36  Completed   Flu Shot  Completed   HPV Vaccine (No Doses Required) Completed   Hepatitis C Screening  Completed   HIV Screening  Completed   Hepatitis B Vaccine  Aged Out   Meningitis B Vaccine  Aged Out   Cologuard (Stool DNA test)  Discontinued  *Topic was postponed. The date shown is not the original due date.     Vision: Annual vision screenings are recommended for early detection of glaucoma, cataracts, and diabetic retinopathy. These exams can also reveal signs of chronic conditions such as diabetes and high blood pressure.  Dental: Annual dental screenings help detect early signs of oral cancer, gum disease, and other conditions linked to overall health, including heart disease and diabetes.  Please see the  attached documents for additional preventive care recommendations.   NEXT AWV  10/21/25 @ 11:30 AM IN PERSON

## 2024-10-15 NOTE — Progress Notes (Addendum)
 "  Chief Complaint  Patient presents with   Medicare Wellness     Subjective:   Jaquavious Mercer is a 64 y.o. male who presents for a Medicare Annual Wellness Visit.  Visit info / Clinical Intake: Medicare Wellness Visit Type:: Subsequent Annual Wellness Visit Persons participating in visit and providing information:: patient Medicare Wellness Visit Mode:: In-person (required for WTM) Interpreter Needed?: No Pre-visit prep was completed: yes AWV questionnaire completed by patient prior to visit?: no Living arrangements:: (!) lives alone Patient's Overall Health Status Rating: very good Typical amount of pain: (!) a lot Does pain affect daily life?: (!) yes Are you currently prescribed opioids?: no  Dietary Habits and Nutritional Risks How many meals a day?: 2 Eats fruit and vegetables daily?: yes Most meals are obtained by: preparing own meals In the last 2 weeks, have you had any of the following?: none Diabetic:: no  Functional Status Activities of Daily Living (to include ambulation/medication): Independent Ambulation: Independent Medication Administration: Independent Home Management (perform basic housework or laundry): Independent Manage your own finances?: yes Primary transportation is: driving Concerns about vision?: no *vision screening is required for WTM* (READERS- NO MD) Concerns about hearing?: no  Fall Screening Falls in the past year?: 0 Number of falls in past year: 0 Was there an injury with Fall?: 0 Fall Risk Category Calculator: 0 Patient Fall Risk Level: Low Fall Risk  Fall Risk Patient at Risk for Falls Due to: No Fall Risks Fall risk Follow up: Falls evaluation completed; Follow up appointment  Home and Transportation Safety: All rugs have non-skid backing?: N/A, no rugs All stairs or steps have railings?: yes Grab bars in the bathtub or shower?: yes Have non-skid surface in bathtub or shower?: yes Good home lighting?: yes Regular seat belt  use?: yes Hospital stays in the last year:: no  Cognitive Assessment Difficulty concentrating, remembering, or making decisions? : no Will 6CIT or Mini Cog be Completed: yes What year is it?: 0 points What month is it?: 0 points Give patient an address phrase to remember (5 components): [address] [place], [place] About what time is it?: 0 points Count backwards from 20 to 1: 0 points Say the months of the year in reverse: 4 points Repeat the address phrase from earlier: 4 points 6 CIT Score: 8 points  Advance Directives (For Healthcare) Does Patient Have a Medical Advance Directive?: No Would patient like information on creating a medical advance directive?: No - Patient declined  Reviewed/Updated  Reviewed/Updated: Reviewed All (Medical, Surgical, Family, Medications, Allergies, Care Teams, Patient Goals)    Allergies (verified) Patient has no known allergies.   Current Medications (verified) Outpatient Encounter Medications as of 10/15/2024  Medication Sig   buPROPion  (WELLBUTRIN  XL) 300 MG 24 hr tablet Take 1 tablet (300 mg total) by mouth daily.   busPIRone  (BUSPAR ) 30 MG tablet Take 1 tablet (30 mg total) by mouth daily.   celecoxib  (CELEBREX ) 200 MG capsule Take 1 capsule (200 mg total) by mouth daily.   citalopram  (CELEXA ) 40 MG tablet Take 1 tablet (40 mg total) by mouth daily.   ezetimibe  (ZETIA ) 10 MG tablet Take 1 tablet (10 mg total) by mouth daily.   hydrOXYzine  (ATARAX ) 25 MG tablet Take 1 tablet (25 mg total) by mouth every evening.   metFORMIN  (GLUCOPHAGE -XR) 500 MG 24 hr tablet Take 1 tablet (500 mg total) by mouth daily with breakfast.   pregabalin  (LYRICA ) 100 MG capsule Take 1 capsule (100 mg total) by mouth  2 (two) times daily.   rosuvastatin  (CRESTOR ) 40 MG tablet Take 1 tablet (40 mg total) by mouth daily.   tiZANidine  (ZANAFLEX ) 4 MG tablet Take 1 tablet (4 mg total) by mouth 2 (two) times daily.   sildenafil  (VIAGRA ) 100 MG tablet Take 0.5-1 tablets  (50-100 mg total) by mouth daily as needed for erectile dysfunction. (Patient not taking: Reported on 10/15/2024)   No facility-administered encounter medications on file as of 10/15/2024.    History: Past Medical History:  Diagnosis Date   Anxiety    Arthritis    Chronic back pain    Chronic pain    Depression    Generalized anxiety disorder    Hyperlipidemia    Obesity (BMI 30-39.9)    Prediabetes 01/08/2018   Smokers' cough [facility])    Past Surgical History:  Procedure Laterality Date   CATARACT EXTRACTION Right 2014   COLONOSCOPY N/A 09/14/2024   Procedure: COLONOSCOPY WITH POLYPECTOMY;  Surgeon: Melany Clotilda HERO, MD;  Location: [facility];  Service: Endoscopy;  Laterality: N/A;   NASAL SINUS SURGERY  2004   deviated septum   Family History  Adopted: Yes  Family history unknown: Yes   Social History   Occupational History   Occupation: disabled     Comment: since 2004, accident at work 2001, used to engineer, 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   Tobacco Use   Smoking status: Every Day    Current packs/day: 1.50    Average packs/day: 1.5 packs/day for 48.0 years (72.1 ttl pk-yrs)    Types: Cigarettes    Start date: 1978   Smokeless tobacco: Former    Types: Chew, Snuff    Quit date: 2001   Tobacco comments:    He started at 61, stopped for over 10 years but is smoking again and is trying to cut down on amount    Vaping Use   Vaping status: Former  Substance and Sexual Activity   Alcohol use: Yes    Alcohol/week: 16.0 standard drinks of alcohol    Types: 4 Glasses of wine, 12 Cans of beer per week   Drug use: No   Sexual activity: Not Currently    Partners: Female    Birth control/protection: Condom   Tobacco Counseling Ready to quit: Not Answered Counseling given: Not Answered Tobacco comments: He started at 12, stopped for over 10 years but is smoking again and is trying to cut down on amount    SDOH Screenings   Food Insecurity: No Food  Insecurity (10/15/2024)  Housing: Unknown (10/15/2024)  Transportation Needs: No Transportation Needs (10/15/2024)  Utilities: Not At Risk (10/15/2024)  Alcohol Screen: Low Risk (10/08/2024)  Depression (PHQ2-9): Low Risk (10/15/2024)  Financial Resource Strain: Medium Risk (10/08/2024)  Physical Activity: Insufficiently Active (10/15/2024)  Social Connections: Socially Isolated (10/15/2024)  Stress: No Stress Concern Present (10/15/2024)  Recent Concern: Stress - Stress Concern Present (10/08/2024)  Tobacco Use: High Risk (10/15/2024)  Health Literacy: Adequate Health Literacy (10/15/2024)   See flowsheets for full screening details  Depression Screen PHQ 2 & 9 Depression Scale- Over the past 2 weeks, how often have you been bothered by any of the following problems? Little interest or pleasure in doing things: 1 Feeling down, depressed, or hopeless (PHQ Adolescent also includes...irritable): 1 PHQ-2 Total Score: 2 Trouble falling or staying asleep, or sleeping too much: 0 Feeling tired or having little energy: 0 Poor appetite or overeating (PHQ Adolescent also includes...weight loss): 0 Feeling bad about yourself -  or that you are a failure or have let yourself or your family down: 0 Trouble concentrating on things, such as reading the newspaper or watching television (PHQ Adolescent also includes...like school work): 0 Moving or speaking so slowly that other people could have noticed. Or the opposite - being so fidgety or restless that you have been moving around a lot more than usual: 0 Thoughts that you would be better off dead, or of hurting yourself in some way: 0 PHQ-9 Total Score: 2 If you checked off any problems, how difficult have these problems made it for you to do your work, take care of things at home, or get along with other people?: Not difficult at all  Depression Treatment Depression Interventions/Treatment : EYV7-0 Score <4 Follow-up Not Indicated     Goals Addressed              This Visit's Progress    Cut out extra servings               Objective:    Today's Vitals   10/15/24 1342  BP: 112/80  Weight: 256 lb 6.4 oz (116.3 kg)  Height: 5' 6 (1.676 m)   Body mass index is 41.38 kg/m.  Hearing/Vision screen No results found. Immunizations and Health Maintenance Health Maintenance  Topic Date Due   COVID-19 Vaccine (1) Never done   Zoster Vaccines- Shingrix  (1 of 2) Never done   Medicare Annual Wellness (AWV)  10/02/2024   Lung Cancer Screening  06/25/2025 (Originally 03/09/2015)   DTaP/Tdap/Td (3 - Td or Tdap) 08/31/2025   Colonoscopy  09/15/2027   Pneumococcal Vaccine: 50+ Years  Completed   Influenza Vaccine  Completed   HPV VACCINES (No Doses Required) Completed   Hepatitis C Screening  Completed   HIV Screening  Completed   Hepatitis B Vaccines 19-59 Average Risk  Aged Out   Meningococcal B Vaccine  Aged Out   Fecal DNA (Cologuard)  Discontinued        Assessment/Plan:  This is a routine wellness examination for [facility].  Patient Care Team: Sowles, Krichna, MD as PCP - General (Family Medicine) Marcelino Nurse, MD as Consulting Physician (Pain Medicine)  I have personally reviewed and noted the following in the patients chart:   Medical and social history Use of alcohol, tobacco or illicit drugs  Current medications and supplements including opioid prescriptions. Functional ability and status Nutritional status Physical activity Advanced directives List of other physicians Hospitalizations, surgeries, and ER visits in previous 12 months Vitals Screenings to include cognitive, depression, and falls Referrals and appointments  No orders of the defined types were placed in this encounter.  In addition, I have reviewed and discussed with patient certain preventive protocols, quality metrics, and best practice recommendations. A written personalized care plan for preventive services as well as general preventive health  recommendations were provided to patient.   Jhonnie GORMAN Das, LPN   [id number]/84/7973   Return in about 53 weeks (around 10/21/2025).  After Visit Summary: (In Person-Declined) Patient declined AVS at this time.  Nurse Notes: DECLINES REFERRAL FOR LUNG CA SCREENING- UTD ON SHOTS EXCEPT SHINGRIX ; UTD ON COLONOSCOPY  "

## 2024-11-12 ENCOUNTER — Encounter: Payer: Medicare PPO | Admitting: Family Medicine

## 2024-12-23 ENCOUNTER — Ambulatory Visit: Admitting: Family Medicine

## 2025-10-21 ENCOUNTER — Ambulatory Visit
